# Patient Record
Sex: Female | Born: 1950
Health system: Southern US, Community
[De-identification: ages and names within clinical notes are randomized; demographics above are authoritative.]

## PROBLEM LIST (undated history)

## (undated) DIAGNOSIS — R42 Dizziness and giddiness: Secondary | ICD-10-CM

## (undated) DIAGNOSIS — J189 Pneumonia, unspecified organism: Secondary | ICD-10-CM

## (undated) DIAGNOSIS — E785 Hyperlipidemia, unspecified: Secondary | ICD-10-CM

## (undated) DIAGNOSIS — M1990 Unspecified osteoarthritis, unspecified site: Secondary | ICD-10-CM

## (undated) DIAGNOSIS — I10 Essential (primary) hypertension: Secondary | ICD-10-CM

## (undated) DIAGNOSIS — G709 Myoneural disorder, unspecified: Secondary | ICD-10-CM

## (undated) DIAGNOSIS — C801 Malignant (primary) neoplasm, unspecified: Secondary | ICD-10-CM

## (undated) DIAGNOSIS — T7840XA Allergy, unspecified, initial encounter: Secondary | ICD-10-CM

## (undated) DIAGNOSIS — G629 Polyneuropathy, unspecified: Secondary | ICD-10-CM

## (undated) DIAGNOSIS — K589 Irritable bowel syndrome without diarrhea: Secondary | ICD-10-CM

## (undated) DIAGNOSIS — K219 Gastro-esophageal reflux disease without esophagitis: Secondary | ICD-10-CM

## (undated) DIAGNOSIS — N189 Chronic kidney disease, unspecified: Secondary | ICD-10-CM

## (undated) HISTORY — PX: EYE SURGERY: SHX253

## (undated) HISTORY — DX: Myoneural disorder, unspecified: G70.9

## (undated) HISTORY — DX: Unspecified osteoarthritis, unspecified site: M19.90

## (undated) HISTORY — DX: Hyperlipidemia, unspecified: E78.5

## (undated) HISTORY — PX: VEIN SURGERY: SHX48

## (undated) HISTORY — DX: Essential (primary) hypertension: I10

## (undated) HISTORY — DX: Gastro-esophageal reflux disease without esophagitis: K21.9

## (undated) HISTORY — PX: APPENDECTOMY: SHX54

## (undated) HISTORY — DX: Irritable bowel syndrome without diarrhea: K58.9

## (undated) HISTORY — PX: COLONOSCOPY: SHX174

## (undated) HISTORY — DX: Allergy, unspecified, initial encounter: T78.40XA

---
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
## 1975-02-27 HISTORY — PX: TUBAL LIGATION: SHX77

## 2005-03-03 ENCOUNTER — Emergency Department: Payer: Self-pay | Admitting: Emergency Medicine

## 2005-03-08 ENCOUNTER — Emergency Department: Payer: Self-pay | Admitting: Emergency Medicine

## 2005-03-16 ENCOUNTER — Ambulatory Visit: Payer: Self-pay | Admitting: Family Medicine

## 2007-02-11 ENCOUNTER — Ambulatory Visit: Payer: Self-pay

## 2008-02-24 ENCOUNTER — Ambulatory Visit: Payer: Self-pay | Admitting: Family Medicine

## 2008-03-04 ENCOUNTER — Ambulatory Visit: Payer: Self-pay | Admitting: Family Medicine

## 2009-07-22 ENCOUNTER — Ambulatory Visit: Payer: Self-pay | Admitting: Family

## 2009-10-21 ENCOUNTER — Ambulatory Visit: Payer: Self-pay | Admitting: Family

## 2009-11-24 ENCOUNTER — Ambulatory Visit: Payer: Self-pay | Admitting: Family

## 2009-11-26 DIAGNOSIS — K589 Irritable bowel syndrome without diarrhea: Secondary | ICD-10-CM

## 2009-11-26 HISTORY — DX: Irritable bowel syndrome, unspecified: K58.9

## 2009-12-01 ENCOUNTER — Ambulatory Visit: Payer: Self-pay | Admitting: Gastroenterology

## 2010-12-14 ENCOUNTER — Encounter: Payer: Self-pay | Admitting: Internal Medicine

## 2010-12-14 ENCOUNTER — Ambulatory Visit (INDEPENDENT_AMBULATORY_CARE_PROVIDER_SITE_OTHER): Payer: PRIVATE HEALTH INSURANCE | Admitting: Internal Medicine

## 2010-12-14 DIAGNOSIS — M1990 Unspecified osteoarthritis, unspecified site: Secondary | ICD-10-CM | POA: Insufficient documentation

## 2010-12-14 DIAGNOSIS — Z124 Encounter for screening for malignant neoplasm of cervix: Secondary | ICD-10-CM

## 2010-12-14 DIAGNOSIS — J301 Allergic rhinitis due to pollen: Secondary | ICD-10-CM | POA: Insufficient documentation

## 2010-12-14 DIAGNOSIS — M129 Arthropathy, unspecified: Secondary | ICD-10-CM

## 2010-12-14 DIAGNOSIS — Z1239 Encounter for other screening for malignant neoplasm of breast: Secondary | ICD-10-CM | POA: Insufficient documentation

## 2010-12-14 DIAGNOSIS — E785 Hyperlipidemia, unspecified: Secondary | ICD-10-CM

## 2010-12-14 DIAGNOSIS — K219 Gastro-esophageal reflux disease without esophagitis: Secondary | ICD-10-CM | POA: Insufficient documentation

## 2010-12-14 DIAGNOSIS — I10 Essential (primary) hypertension: Secondary | ICD-10-CM | POA: Insufficient documentation

## 2010-12-14 MED ORDER — OMEPRAZOLE 20 MG PO CPDR: 20.0000 mg | DELAYED_RELEASE_CAPSULE | Freq: Every day | ORAL | Status: DC | PRN

## 2010-12-14 NOTE — Patient Instructions (Addendum)
For bloating or gas pains,  Try Gas X or Mylanta Gas  Try taking 2 ibupfrefen and 1 tylenol before bed for your joint pain .   Try the glucasomine supplement daily for 8 weeks for all joints.

## 2010-12-17 ENCOUNTER — Encounter: Payer: Self-pay | Admitting: Internal Medicine

## 2010-12-17 MED ORDER — GLUCOSAMINE-CHONDROITIN 500-400 MG PO TABS: 1.0000 | ORAL_TABLET | Freq: Four times a day (QID) | ORAL | Status: AC

## 2010-12-17 NOTE — Assessment & Plan Note (Signed)
She is tolerating simvastatin and recently had employee labs,  Which have been requested. Goal is LDL < 100.

## 2010-12-17 NOTE — Assessment & Plan Note (Signed)
She has mild pain and normal ROM in the right shoulder, aggravated by work.  Recommended judicious use of NSAIDs/acetominophen for now.

## 2010-12-17 NOTE — Progress Notes (Signed)
[id number] Subjective:    Patient ID: Jillian [name], female    DOB: Jul 05, 1950, 60 y.o.   MRN: [id number]  HPI  60 yo white female referred by daughter to transfer primary care from [facility] clinic for continuity issues.   Past Medical History  Diagnosis Date  . Arthritis     right shoulder,  no intervention  . Hypertension   . Irritable bowel syndrome (IBS) Oct 2011    last colonoscopy showed diverticulum, no polyps  . Hyperlipidemia   . Allergy     seasonal rhinitis  . GERD (gastroesophageal reflux disease)      No current outpatient prescriptions on file prior to visit.     Review of Systems  Constitutional: Negative for fever, chills and unexpected weight change.  HENT: Negative for hearing loss, ear pain, nosebleeds, congestion, sore throat, facial swelling, rhinorrhea, sneezing, mouth sores, trouble swallowing, neck pain, neck stiffness, voice change, postnasal drip, sinus pressure, tinnitus and ear discharge.   Eyes: Negative for pain, discharge, redness and visual disturbance.  Respiratory: Negative for cough, chest tightness, shortness of breath, wheezing and stridor.   Cardiovascular: Negative for chest pain, palpitations and leg swelling.  Musculoskeletal: Negative for myalgias and arthralgias.  Skin: Negative for color change and rash.  Neurological: Negative for dizziness, weakness, light-headedness and headaches.  Hematological: Negative for adenopathy.       Objective:   Physical Exam  Constitutional: She is oriented to person, place, and time. She appears well-developed and well-nourished.  HENT:  Mouth/Throat: Oropharynx is clear and moist.  Eyes: EOM are normal. Pupils are equal, round, and reactive to light. No scleral icterus.  Neck: Normal range of motion. Neck supple. No JVD present. No thyromegaly present.  Cardiovascular: Normal rate, regular rhythm, normal heart sounds and intact distal pulses.   Pulmonary/Chest: Effort normal and breath  sounds normal.  Abdominal: Soft. Bowel sounds are normal. She exhibits no mass. There is no tenderness.  Musculoskeletal: Normal range of motion. She exhibits no edema.  Lymphadenopathy:    She has no cervical adenopathy.  Neurological: She is alert and oriented to person, place, and time.  Skin: Skin is warm and dry.  Psychiatric: She has a normal mood and affect.          Assessment & Plan:

## 2011-02-06 ENCOUNTER — Ambulatory Visit: Payer: Self-pay | Admitting: Internal Medicine

## 2011-02-12 ENCOUNTER — Other Ambulatory Visit: Payer: Self-pay | Admitting: *Deleted

## 2011-02-12 MED ORDER — ENALAPRIL MALEATE 20 MG PO TABS: 20.0000 mg | ORAL_TABLET | Freq: Two times a day (BID) | ORAL | Status: DC

## 2011-02-17 LAB — HM MAMMOGRAPHY: HM Mammogram: NORMAL

## 2011-03-01 ENCOUNTER — Encounter: Payer: Self-pay | Admitting: Internal Medicine

## 2011-03-14 ENCOUNTER — Ambulatory Visit (INDEPENDENT_AMBULATORY_CARE_PROVIDER_SITE_OTHER): Payer: PRIVATE HEALTH INSURANCE | Admitting: Internal Medicine

## 2011-03-14 ENCOUNTER — Encounter: Payer: Self-pay | Admitting: Internal Medicine

## 2011-03-14 VITALS — BP 132/90 | HR 80 | Temp 98.7°F | Wt 191.0 lb

## 2011-03-14 DIAGNOSIS — M25519 Pain in unspecified shoulder: Secondary | ICD-10-CM

## 2011-03-14 DIAGNOSIS — E785 Hyperlipidemia, unspecified: Secondary | ICD-10-CM

## 2011-03-14 DIAGNOSIS — I10 Essential (primary) hypertension: Secondary | ICD-10-CM

## 2011-03-14 DIAGNOSIS — M25511 Pain in right shoulder: Secondary | ICD-10-CM

## 2011-03-14 MED ORDER — TRAMADOL HCL 50 MG PO TABS: 50.0000 mg | ORAL_TABLET | Freq: Three times a day (TID) | ORAL | Status: AC | PRN

## 2011-03-14 NOTE — Patient Instructions (Addendum)
For your shoulder,  You may take up to 4 gelcaps of advil at a time, not more than every 8 hours   Try adding tramadol (rx) 50 mg one tablet every 8 hours or just at bedtime to complement the advil for pain releif.  We will refer you to [facility],  Dr Ernest Pine,   For right shoulderr films and nonsurgical treatment   I recommend  Using the omeprazole daily to protect your stomach.

## 2011-03-14 NOTE — Progress Notes (Signed)
Subjective:    Patient ID: Jillian [name], female    DOB: 1950-06-04, 61 y.o.   MRN: [id number]  HPI  [name] is a 61 yo white female with a history of hypertension, hyperlipidemia and former tobacco abuse who presents for follow up on persistent  right shoulder pain which started end o September.  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  Her pain has increased and since November has had decreased ROM due to increased pain.  She cannot put her arm behind her back to tie her apron.  The pain  starts at top of her shoulder and radiates to her elbow.  She has noted tingling of the arm at bedtime which is disturbing her sleep. She has had no prior orthopedic eval and no prior x rays.  No history of trauma to the arm.  Has been using ibuprofen and glucosamine / chondroitin sulfate supplements daily with no significant difference.   Past Medical History  Diagnosis Date  . Arthritis     right shoulder,  no intervention  . Hypertension   . Irritable bowel syndrome (IBS) Oct 2011    last colonoscopy showed diverticulum, no polyps  . Hyperlipidemia   . Allergy     seasonal rhinitis  . GERD (gastroesophageal reflux disease)    Current Outpatient Prescriptions on File Prior to Visit  Medication Sig Dispense Refill  . amLODipine (NORVASC) 2.5 MG tablet Take 2.5 mg by mouth daily.        Marland Kitchen aspirin 81 MG tablet Take 81 mg by mouth daily.        . Calcium Carbonate-Vit D-Min (GNP CALCIUM 600 PLUS D/MINERAL) 600-400 MG-UNIT TABS Take 2 tablets by mouth daily.        . Cholecalciferol (VITAMIN D3) 1000 UNITS CAPS Take 1 capsule by mouth daily.        . enalapril (VASOTEC) 20 MG tablet Take 1 tablet (20 mg total) by mouth 2 (two) times daily.  60 tablet  5  . fexofenadine (ALLEGRA) 180 MG tablet Take 180 mg by mouth daily. Prn for seasonal allergies       . fluticasone (VERAMYST) 27.5 MCG/SPRAY nasal spray Place 2  sprays into the nose daily.        Marland Kitchen glucosamine-chondroitin (MAX GLUCOSAMINE CHONDROITIN) 500-400 MG tablet Take 1 tablet by mouth 4 (four) times daily.  120 tablet  5  . ibuprofen (ADVIL,MOTRIN) 200 MG tablet Take 400 mg by mouth every 6 (six) hours as needed.        . magnesium oxide (MAG-OX) 400 MG tablet Take 400 mg by mouth daily.        Marland Kitchen omeprazole (PRILOSEC) 20 MG capsule Take 1 capsule (20 mg total) by mouth daily as needed.  30 capsule  6  . simvastatin (ZOCOR) 40 MG tablet Take 40 mg by mouth at bedtime.            Review of Systems  Constitutional: Negative for fever, chills and unexpected weight change.  HENT: Negative for hearing loss, ear pain, nosebleeds, congestion, sore throat, facial swelling, rhinorrhea, sneezing, mouth sores, trouble swallowing, neck pain, neck stiffness, voice change, postnasal drip, sinus pressure, tinnitus and ear discharge.   Eyes: Negative for pain, discharge, redness and visual disturbance.  Respiratory: Negative for cough, chest tightness, shortness of breath, wheezing and stridor.   Cardiovascular: Negative for chest pain, palpitations and leg  swelling.  Musculoskeletal: Positive for arthralgias. Negative for myalgias.  Skin: Negative for color change and rash.  Neurological: Negative for dizziness, weakness, light-headedness and headaches.  Hematological: Negative for adenopathy.     BP 132/90  Pulse 80  Temp(Src) 98.7 F (37.1 C) (Oral)  Wt 191 lb (86.637 kg)  SpO2 97%     Objective:   Physical Exam  Constitutional: She is oriented to person, place, and time. She appears well-developed and well-nourished.  HENT:  Mouth/Throat: Oropharynx is clear and moist.  Eyes: EOM are normal. Pupils are equal, round, and reactive to light. No scleral icterus.  Neck: Normal range of motion. Neck supple. No JVD present. No thyromegaly present.  Cardiovascular: Normal rate, regular rhythm, normal heart sounds and intact distal pulses.     Pulmonary/Chest: Effort normal and breath sounds normal.  Abdominal: Soft. Bowel sounds are normal. She exhibits no mass. There is no tenderness.  Musculoskeletal: She exhibits no edema.       Right shoulder: She exhibits decreased range of motion and tenderness. She exhibits no swelling, no effusion and no crepitus.       Arms: Lymphadenopathy:    She has no cervical adenopathy.  Neurological: She is alert and oriented to person, place, and time.  Skin: Skin is warm and dry.  Psychiatric: She has a normal mood and affect.          Assessment & Plan:   Hypertension Elevated today, normal last visit on current regimen.  Have asked her to have her BP rechecked at [facility].  May befdue to use of NSAIDs.  She has normal renal function by labs done in Sept at [facility]. Cr was 0.92  Hyperlipidemia LDL was 89, trigs 180 and HDl 61 in September by [facility]  And LFTS were normal.  Will continue simvstatin   Shoulder pain, right ddx includes adhesive capsulitis vs impingement syndrome from bone spur vs partial rotator cuff tear.  Will refer to Dr. Ernest Pine for orthopedic evaluation.  Continue prn ibuprofen and add tramadol     Updated Medication List Outpatient Encounter Prescriptions as of 03/14/2011  Medication Sig Dispense Refill  . amLODipine (NORVASC) 2.5 MG tablet Take 2.5 mg by mouth daily.        Marland Kitchen aspirin 81 MG tablet Take 81 mg by mouth daily.        . Calcium Carbonate-Vit D-Min (GNP CALCIUM 600 PLUS D/MINERAL) 600-400 MG-UNIT TABS Take 2 tablets by mouth daily.        . Cholecalciferol (VITAMIN D3) 1000 UNITS CAPS Take 1 capsule by mouth daily.        . enalapril (VASOTEC) 20 MG tablet Take 1 tablet (20 mg total) by mouth 2 (two) times daily.  60 tablet  5  . fexofenadine (ALLEGRA) 180 MG tablet Take 180 mg by mouth daily. Prn for seasonal allergies       . fluticasone (VERAMYST) 27.5 MCG/SPRAY nasal spray Place 2 sprays into the nose daily.        Marland Kitchen  glucosamine-chondroitin (MAX GLUCOSAMINE CHONDROITIN) 500-400 MG tablet Take 1 tablet by mouth 4 (four) times daily.  120 tablet  5  . ibuprofen (ADVIL,MOTRIN) 200 MG tablet Take 400 mg by mouth every 6 (six) hours as needed.        . magnesium oxide (MAG-OX) 400 MG tablet Take 400 mg by mouth daily.        Marland Kitchen omeprazole (PRILOSEC) 20 MG capsule Take 1 capsule (20 mg total)  by mouth daily as needed.  30 capsule  6  . simvastatin (ZOCOR) 40 MG tablet Take 40 mg by mouth at bedtime.        . traMADol (ULTRAM) 50 MG tablet Take 1 tablet (50 mg total) by mouth every 8 (eight) hours as needed for pain.  90 tablet  1

## 2011-03-15 ENCOUNTER — Encounter: Payer: Self-pay | Admitting: Internal Medicine

## 2011-03-15 NOTE — Assessment & Plan Note (Signed)
LDL was 89, trigs 180 and HDl 61 in September by [facility]  And LFTS were normal.  Will continue simvstatin

## 2011-03-15 NOTE — Assessment & Plan Note (Signed)
ddx includes adhesive capsulitis vs impingement syndrome from bone spur vs partial rotator cuff tear.  Will refer to Dr. Ernest Pine for orthopedic evaluation.  Continue prn ibuprofen and add tramadol

## 2011-03-15 NOTE — Assessment & Plan Note (Signed)
Elevated today, normal last visit on current regimen.  Have asked her to have her BP rechecked at [facility].  May befdue to use of NSAIDs.  She has normal renal function by labs done in Sept at [facility]. Cr was 0.92

## 2011-03-20 ENCOUNTER — Encounter: Payer: Self-pay | Admitting: Physician Assistant

## 2011-03-30 ENCOUNTER — Encounter: Payer: Self-pay | Admitting: Physician Assistant

## 2011-04-27 ENCOUNTER — Encounter: Payer: Self-pay | Admitting: Physician Assistant

## 2011-06-12 ENCOUNTER — Ambulatory Visit: Payer: PRIVATE HEALTH INSURANCE | Admitting: Internal Medicine

## 2011-06-18 ENCOUNTER — Ambulatory Visit (INDEPENDENT_AMBULATORY_CARE_PROVIDER_SITE_OTHER): Payer: PRIVATE HEALTH INSURANCE | Admitting: Internal Medicine

## 2011-06-18 ENCOUNTER — Encounter: Payer: Self-pay | Admitting: Internal Medicine

## 2011-06-18 VITALS — BP 136/80 | HR 62 | Temp 98.3°F | Resp 14 | Wt 194.8 lb

## 2011-06-18 DIAGNOSIS — M25519 Pain in unspecified shoulder: Secondary | ICD-10-CM

## 2011-06-18 DIAGNOSIS — I10 Essential (primary) hypertension: Secondary | ICD-10-CM

## 2011-06-18 DIAGNOSIS — E669 Obesity, unspecified: Secondary | ICD-10-CM

## 2011-06-18 DIAGNOSIS — E785 Hyperlipidemia, unspecified: Secondary | ICD-10-CM

## 2011-06-18 DIAGNOSIS — M25511 Pain in right shoulder: Secondary | ICD-10-CM

## 2011-06-18 LAB — COMPLETE METABOLIC PANEL WITH GFR
Albumin: 4.4 g/dL (ref 3.5–5.2)
Alkaline Phosphatase: 59 U/L (ref 39–117)
BUN: 17 mg/dL (ref 6–23)
Creat: 1.01 mg/dL (ref 0.50–1.10)
GFR, Est Non African American: 60 mL/min
Glucose, Bld: 86 mg/dL (ref 70–99)
Potassium: 4.1 mEq/L (ref 3.5–5.3)

## 2011-06-18 LAB — LIPID PANEL
HDL: 55.8 mg/dL (ref >39.00)
Total CHOL/HDL Ratio: 3

## 2011-06-18 MED ORDER — ENALAPRIL MALEATE 20 MG PO TABS: 20.0000 mg | ORAL_TABLET | Freq: Two times a day (BID) | ORAL | Status: DC

## 2011-06-18 NOTE — Progress Notes (Signed)
Patient ID: Jillian [name], female   DOB: 11-06-1950, 61 y.o.   MRN: [id number]   Patient Active Problem List  Diagnoses  . Arthritis  . Hyperlipidemia  . Hypertension  . Allergy  . Screening for breast cancer  . Screening for cervical cancer  . GERD (gastroesophageal reflux disease)  . Shoulder pain, right  . Obesity (BMI 30-39.9)    Subjective:  CC:   Chief Complaint  Patient presents with  . Follow-up    HPI:   Jillian [name] a 61 y.o. female who presents  for follow up on chronic issues including hypertension, hyperlipidemia gastroesophageal reflux disease , and right shoulder pain aggravated by work duties.  She  received steroid injection in the superiori scapular region by Dr. Elenor Legato PA with good results, including immediate relief of pain.  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    Past Medical History  Diagnosis Date  . Arthritis     right shoulder,  no intervention  . Hypertension   . Irritable bowel syndrome (IBS) Oct 2011    last colonoscopy showed diverticulum, no polyps  . Hyperlipidemia   . Allergy     seasonal rhinitis  . GERD (gastroesophageal reflux disease)     Past Surgical History  Procedure Date  . Tubal ligation 1977  . Tonsillectomy and adenoidectomy 1956         The following portions of the patient's history were reviewed and updated as appropriate: Allergies, current medications, and problem list.    Review of Systems:   12 Pt  review of systems was negative except those addressed in the HPI,     History   Social History  . Marital Status: Divorced    Spouse Name: N/A    Number of Children: N/A  . Years of Education: N/A   Occupational History  . cutsodial     works at [employer]   Social History Main Topics  . Smoking  status: Former Smoker    Types: Cigarettes    Quit date: 03/16/1999  . Smokeless tobacco: Never Used  . Alcohol Use: No  . Drug Use: No  . Sexually Active: Not Currently   Other Topics Concern  . Not on file   Social History Narrative  . No narrative on file    Objective:  BP 136/80  Pulse 62  Temp(Src) 98.3 F (36.8 C) (Oral)  Resp 14  Wt 194 lb 12 oz (88.338 kg)  SpO2 94%  General appearance: alert, cooperative and appears stated age Ears: normal TM's and external ear canals both ears Throat: lips, mucosa, and tongue normal; teeth and gums normal Neck: no adenopathy, no carotid bruit, supple, symmetrical, trachea midline and thyroid not enlarged, symmetric, no tenderness/mass/nodules Back: symmetric, no curvature. ROM normal. No CVA tenderness. Lungs: clear to auscultation bilaterally Heart: regular rate and rhythm, S1, S2 normal, no murmur, click, rub or gallop Abdomen: soft, non-tender; bowel sounds normal; no masses,  no organomegaly Pulses: 2+ and symmetric Skin: Skin color, texture, turgor normal. No rashes or lesions Lymph nodes: Cervical, supraclavicular, and axillary nodes normal.  Assessment and Plan:  Hyperlipidemia Managed with zocor.  LDL is below goal at 76.  Hypertension Improved today. Renal function is normal. Continue current medications  Shoulder pain, right Secondary to  bone spurs due to overuse phenomenon. She has significant relief with a steroid injection into the shoulder. Currently her pain is not completely resolved but is tolerable. 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  Obesity (BMI 30-39.9) I have addressed  BMI and recommended a low glycemic index diet utilizing smaller more frequent meals to increase metabolism.  I have also recommended that patient start exercising with a goal of 30 minutes of aerobic exercise a minimum of 5 days per week.      Updated  Medication List Outpatient Encounter Prescriptions as of 06/18/2011  Medication Sig Dispense Refill  . amLODipine (NORVASC) 2.5 MG tablet Take 2.5 mg by mouth daily.        Marland Kitchen aspirin 81 MG tablet Take 81 mg by mouth daily.        . Cholecalciferol (VITAMIN D3) 1000 UNITS CAPS Take 1 capsule by mouth daily.        . enalapril (VASOTEC) 20 MG tablet Take 1 tablet (20 mg total) by mouth 2 (two) times daily.  180 tablet  3  . fexofenadine (ALLEGRA) 180 MG tablet Take 180 mg by mouth daily. Prn for seasonal allergies       . fluticasone (VERAMYST) 27.5 MCG/SPRAY nasal spray Place 2 sprays into the nose daily.        Marland Kitchen glucosamine-chondroitin (MAX GLUCOSAMINE CHONDROITIN) 500-400 MG tablet Take 1 tablet by mouth 4 (four) times daily.  120 tablet  5  . ibuprofen (ADVIL,MOTRIN) 200 MG tablet Take 400 mg by mouth every 6 (six) hours as needed.        . magnesium oxide (MAG-OX) 400 MG tablet Take 400 mg by mouth daily.        Marland Kitchen omeprazole (PRILOSEC) 20 MG capsule Take 1 capsule (20 mg total) by mouth daily as needed.  30 capsule  6  . simvastatin (ZOCOR) 40 MG tablet Take 40 mg by mouth at bedtime.        Marland Kitchen DISCONTD: enalapril (VASOTEC) 20 MG tablet Take 1 tablet (20 mg total) by mouth 2 (two) times daily.  60 tablet  5  . DISCONTD: Calcium Carbonate-Vit D-Min (GNP CALCIUM 600 PLUS D/MINERAL) 600-400 MG-UNIT TABS Take 2 tablets by mouth daily.           Orders Placed This Encounter  Procedures  . MyChart Weight Flowsheet  . TSH  . Lipid panel  . COMPLETE METABOLIC PANEL WITH GFR    No Follow-up on file.

## 2011-06-18 NOTE — Assessment & Plan Note (Addendum)
Improved today. Renal function is normal. Continue current medications

## 2011-06-18 NOTE — Assessment & Plan Note (Addendum)
Managed with zocor.  LDL is below goal at 76.

## 2011-06-18 NOTE — Assessment & Plan Note (Signed)
I have addressed  BMI and recommended a low glycemic index diet utilizing smaller more frequent meals to increase metabolism.  I have also recommended that patient start exercising with a goal of 30 minutes of aerobic exercise a minimum of 5 days per week.  

## 2011-06-18 NOTE — Patient Instructions (Signed)
Consider the Low Glycemic Index Diet and 6 smaller meals daily :   7 AM  Scrambled eggs, meat on a low carb bread or tortilla   (Add or substitute a toasted sandwhich thin w/ peanut butter)  10 AM: Protein bar by Atkins bar:  Caramel chocolate Nut Roll  Lunch: sandwich on pita bread or flatbread (Joseph's makes a pita bread and a flat bread , available at [employer] and [employer]; Toufayah makes a low carb flatbread available at [employer] and [employer])   3 PM:  Mid day :  Another protein bar,  Or a  cheese stick, 1/4 cup of almonds, walnuts, pistachios, pecans, peanuts,  Macadamia nuts  6 PM  Dinner:  "mean and green:"  Meat/chicken/fish, salad, and green veggie : use ranch, vinagrette,  Blue cheese, etc  9 PM snack : Breyer's low carb fudgiscle or  ice cream bar (Carb Smart) Weight Watcher's ice cream bar , or another protein shake

## 2011-06-18 NOTE — Assessment & Plan Note (Signed)
Secondary to bone spurs due to overuse phenomenon. She has significant relief with a steroid injection into the shoulder. Currently her pain is not completely resolved but is tolerable. 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

## 2011-08-28 ENCOUNTER — Other Ambulatory Visit: Payer: Self-pay | Admitting: *Deleted

## 2011-08-28 MED ORDER — AMLODIPINE BESYLATE 2.5 MG PO TABS: 2.5000 mg | ORAL_TABLET | Freq: Every day | ORAL | Status: DC

## 2011-09-09 ENCOUNTER — Ambulatory Visit: Payer: Self-pay | Admitting: Family Medicine

## 2011-09-09 LAB — URINALYSIS, COMPLETE
Bilirubin,UR: NEGATIVE
Glucose,UR: NEGATIVE mg/dL (ref 0–75)
Ketone: NEGATIVE
Specific Gravity: 1.015 (ref 1.003–1.030)

## 2011-09-17 ENCOUNTER — Ambulatory Visit (INDEPENDENT_AMBULATORY_CARE_PROVIDER_SITE_OTHER): Payer: PRIVATE HEALTH INSURANCE | Admitting: Internal Medicine

## 2011-09-17 ENCOUNTER — Encounter: Payer: Self-pay | Admitting: Internal Medicine

## 2011-09-17 VITALS — BP 102/58 | HR 57 | Temp 98.3°F | Resp 16 | Wt 186.0 lb

## 2011-09-17 DIAGNOSIS — M25519 Pain in unspecified shoulder: Secondary | ICD-10-CM

## 2011-09-17 DIAGNOSIS — M25511 Pain in right shoulder: Secondary | ICD-10-CM

## 2011-09-17 DIAGNOSIS — E669 Obesity, unspecified: Secondary | ICD-10-CM

## 2011-09-17 DIAGNOSIS — E785 Hyperlipidemia, unspecified: Secondary | ICD-10-CM

## 2011-09-17 MED ORDER — AMLODIPINE BESYLATE 2.5 MG PO TABS: 2.5000 mg | ORAL_TABLET | Freq: Every day | ORAL | Status: DC

## 2011-09-17 NOTE — Assessment & Plan Note (Signed)
Improved but not resolved,  Aggravated by work duties .  Continue NSAID

## 2011-09-17 NOTE — Progress Notes (Signed)
Patient ID: Jillian [name], female   DOB: 06-18-1950, 61 y.o.   MRN: [id number]  Patient Active Problem List  Diagnosis  . Arthritis  . Hyperlipidemia  . Hypertension  . Allergy  . Screening for breast cancer  . Screening for cervical cancer  . GERD (gastroesophageal reflux disease)  . Shoulder pain, right  . Obesity (BMI 30-39.9)    Subjective:  CC:   Chief Complaint  Patient presents with  . Follow-up    3 month    HPI:   Jillian Hemphillis a 61 y.o. female who presents for followup on chronic issues of hyperlipidemia obesity and hypertension. She had her recent episode of suprapubic pain. She finished Finished  cipro x  7 days for UTI treated by [facility],  But was told the culture did not grow anything.  Her symptoms were flank pain and hematuria which have resolved.  No dysuria but feels irritated   Wondered if she had a stone, no prior history of stones. Has lost 8 lb on the low glycemic index diet since last visit.     Past Medical History  Diagnosis Date  . Arthritis     right shoulder,  no intervention  . Hypertension   . Irritable bowel syndrome (IBS) Oct 2011    last colonoscopy showed diverticulum, no polyps  . Hyperlipidemia   . Allergy     seasonal rhinitis  . GERD (gastroesophageal reflux disease)     Past Surgical History  Procedure Date  . Tubal ligation 1977  . Tonsillectomy and adenoidectomy 1956         The following portions of the patient's history were reviewed and updated as appropriate: Allergies, current medications, and problem list.    Review of Systems:   Review of Systems  Constitutional: Positive for weight loss. Negative for fever and chills.  HENT: Negative.   Eyes: Negative.   Respiratory: Negative for cough and shortness of breath.   Cardiovascular: Negative for chest pain and PND.  Gastrointestinal: Negative for heartburn and abdominal pain.  Genitourinary: Negative for dysuria, frequency and flank pain.    Musculoskeletal: Negative.   Skin: Negative.   Neurological: Negative.   Endo/Heme/Allergies: Negative.   Psychiatric/Behavioral: Negative.        History   Social History  . Marital Status: Divorced    Spouse Name: N/A    Number of Children: N/A  . Years of Education: N/A   Occupational History  . cutsodial     works at [employer]   Social History Main Topics  . Smoking status: Former Smoker    Types: Cigarettes    Quit date: 03/16/1999  . Smokeless tobacco: Never Used  . Alcohol Use: No  . Drug Use: No  . Sexually Active: Not Currently   Other Topics Concern  . Not on file   Social History Narrative  . No narrative on file    Objective:  BP 102/58  Pulse 57  Temp 98.3 F (36.8 C) (Oral)  Resp 16  Wt 186 lb (84.369 kg)  SpO2 95%  General appearance: alert, cooperative and appears stated age Ears: normal TM's and external ear canals both ears Throat: lips, mucosa, and tongue normal; teeth and gums normal Neck: no adenopathy, no carotid bruit, supple, symmetrical, trachea midline and thyroid not enlarged, symmetric, no tenderness/mass/nodules Back: symmetric, no curvature. ROM normal. No CVA tenderness. Lungs: clear to auscultation bilaterally Heart: regular rate and rhythm, S1, S2 normal, no murmur, click, rub or gallop Abdomen:  soft, non-tender; bowel sounds normal; no masses,  no organomegaly Pulses: 2+ and symmetric Skin: Skin color, texture, turgor normal. No rashes or lesions Lymph nodes: Cervical, supraclavicular, and axillary nodes normal.  Assessment and Plan:  Shoulder pain, right Improved but not resolved,  Aggravated by work duties .  Continue NSAID  Obesity (BMI 30-39.9) Improving , with 8 lb wt loss thus far. Her goal is to lose another 40 pounds. Encouraged her that 4 pound weight loss per month is a healthy sustainable rate. Recommended continued to increase her activity level to a goal of 30 minutes of aerobic activity 5 days per week.  Continue low glycemic index diet.  Hyperlipidemia Well controlled with LDL less than 100 by April fasting lipids. Continue Zocor for now. Liver function tests are due.   Updated Medication List Outpatient Encounter Prescriptions as of 09/17/2011  Medication Sig Dispense Refill  . amLODipine (NORVASC) 2.5 MG tablet Take 1 tablet (2.5 mg total) by mouth daily.  30 tablet  5  . aspirin 81 MG tablet Take 81 mg by mouth daily.        . Cholecalciferol (VITAMIN D3) 1000 UNITS CAPS Take 1 capsule by mouth daily.        . enalapril (VASOTEC) 20 MG tablet Take 1 tablet (20 mg total) by mouth 2 (two) times daily.  180 tablet  3  . fexofenadine (ALLEGRA) 180 MG tablet Take 180 mg by mouth daily. Prn for seasonal allergies       . fluticasone (VERAMYST) 27.5 MCG/SPRAY nasal spray Place 2 sprays into the nose daily.        Marland Kitchen ibuprofen (ADVIL,MOTRIN) 200 MG tablet Take 400 mg by mouth every 6 (six) hours as needed.        . magnesium oxide (MAG-OX) 400 MG tablet Take 400 mg by mouth daily.        Marland Kitchen omeprazole (PRILOSEC) 20 MG capsule Take 1 capsule (20 mg total) by mouth daily as needed.  30 capsule  6  . simvastatin (ZOCOR) 40 MG tablet Take 40 mg by mouth at bedtime.        Marland Kitchen DISCONTD: amLODipine (NORVASC) 2.5 MG tablet Take 1 tablet (2.5 mg total) by mouth daily.  30 tablet  4  . glucosamine-chondroitin (MAX GLUCOSAMINE CHONDROITIN) 500-400 MG tablet Take 1 tablet by mouth 4 (four) times daily.  120 tablet  5     Orders Placed This Encounter  Procedures  . HM MAMMOGRAPHY  . HM PAP SMEAR  . HM COLONOSCOPY    No Follow-up on file.

## 2011-09-17 NOTE — Patient Instructions (Addendum)
Have your nonfasting liver and kidney function done in 3 months  (octoberfest)  Return in 6 months for annual exam

## 2011-09-17 NOTE — Assessment & Plan Note (Signed)
Well controlled with LDL less than 100 by April fasting lipids. Continue Zocor for now. Liver function tests are due.

## 2011-09-17 NOTE — Assessment & Plan Note (Signed)
Improving , with 8 lb wt loss thus far. Her goal is to lose another 40 pounds. Encouraged her that 4 pound weight loss per month is a healthy sustainable rate. Recommended continued to increase her activity level to a goal of 30 minutes of aerobic activity 5 days per week. Continue low glycemic index diet.

## 2011-10-23 ENCOUNTER — Encounter: Payer: Self-pay | Admitting: Internal Medicine

## 2011-10-23 ENCOUNTER — Ambulatory Visit (INDEPENDENT_AMBULATORY_CARE_PROVIDER_SITE_OTHER): Payer: PRIVATE HEALTH INSURANCE | Admitting: Internal Medicine

## 2011-10-23 VITALS — BP 114/68 | HR 62 | Temp 98.5°F | Resp 14 | Wt 182.5 lb

## 2011-10-23 DIAGNOSIS — R600 Localized edema: Secondary | ICD-10-CM

## 2011-10-23 DIAGNOSIS — I872 Venous insufficiency (chronic) (peripheral): Secondary | ICD-10-CM

## 2011-10-23 DIAGNOSIS — R609 Edema, unspecified: Secondary | ICD-10-CM

## 2011-10-23 NOTE — Progress Notes (Signed)
Patient ID: Jillian [name], female   DOB: 1950-10-23, 61 y.o.   MRN: [id number]  Patient Active Problem List  Diagnosis  . Arthritis  . Hyperlipidemia  . Hypertension  . Allergy  . Screening for breast cancer  . Screening for cervical cancer  . GERD (gastroesophageal reflux disease)  . Shoulder pain, right  . Obesity (BMI 30-39.9)  . Venous insufficiency of leg    Subjective:  CC:   Chief Complaint  Patient presents with  . Leg Problem    HPI:   Jillian [name] a 61 y.o. female who presents Recurrent red rash on legs occurring on lower extremities .  Aggravated by standing .  legs swollen and warm by the end of the day.   Gone by next morning .   No history of fevers,  insect bites or abrasions to legs.  No history of surgery to either leg but she did have 2 pregnancies and a history of obesity , with 12 lb wt loss since last October and imporvement in BMI to 29 using a Low GI diet. . No prior venous evaluation.  started taking aspirin daily and wearing 2 paris of ankle socks. 2) plantar fasciitis. Managed by  Alberteen Spindle .  She rolls her feet every morning over a frozen bottle of soda to help stretch her fascia and manage the inflammation.   Past Medical History  Diagnosis Date  . Arthritis     right shoulder,  no intervention  . Hypertension   . Irritable bowel syndrome (IBS) Oct 2011    last colonoscopy showed diverticulum, no polyps  . Hyperlipidemia   . Allergy     seasonal rhinitis  . GERD (gastroesophageal reflux disease)     Past Surgical History  Procedure Date  . Tubal ligation 1977  . Tonsillectomy and adenoidectomy 1956         The following portions of the patient's history were reviewed and updated as appropriate: Allergies, current medications, and problem list.    Review of Systems:   12 Pt  review of systems was negative except those addressed in the HPI,     History   Social History  . Marital Status: Divorced    Spouse Name: N/A      Number of Children: N/A  . Years of Education: N/A   Occupational History  . cutsodial     works at [employer]   Social History Main Topics  . Smoking status: Former Smoker    Types: Cigarettes    Quit date: 03/16/1999  . Smokeless tobacco: Never Used  . Alcohol Use: No  . Drug Use: No  . Sexually Active: Not Currently   Other Topics Concern  . Not on file   Social History Narrative  . No narrative on file    Objective:  BP 114/68  Pulse 62  Temp 98.5 F (36.9 C) (Oral)  Resp 14  Wt 182 lb 8 oz (82.781 kg)  SpO2 94%  General appearance: alert, cooperative and appears stated age Ears: normal TM's and external ear canals both ears Throat: lips, mucosa, and tongue normal; teeth and gums normal Neck: no adenopathy, no carotid bruit, supple, symmetrical, trachea midline and thyroid not enlarged, symmetric, no tenderness/mass/nodules Back: symmetric, no curvature. ROM normal. No CVA tenderness. Lungs: clear to auscultation bilaterally Heart: regular rate and rhythm, S1, S2 normal, no murmur, click, rub or gallop Abdomen: soft, non-tender; bowel sounds normal; no masses,  no organomegaly Pulses: 2+ and symmetric Skin: Skin color, texture,  turgor normal. No rashes or lesions Lymph nodes: Cervical, supraclavicular, and axillary nodes normal.  Assessment and Plan:  Venous insufficiency of leg Suspected, by history of recurrent episodes of bilateral edema ,  Redness and warmth aggravated by prolonged standing and relieved with leg elevation.  Risk factors include 2 pregnancies and occupational activities with standing for 8 hours daily. Referral to [facility] for ultrasound.    Updated Medication List Outpatient Encounter Prescriptions as of 10/23/2011  Medication Sig Dispense Refill  . amLODipine (NORVASC) 2.5 MG tablet Take 1 tablet (2.5 mg total) by mouth daily.  30 tablet  5  . aspirin 81 MG tablet Take 81 mg by mouth daily.        . Cholecalciferol (VITAMIN D3) 1000 UNITS  CAPS Take 1 capsule by mouth daily.        . enalapril (VASOTEC) 20 MG tablet Take 1 tablet (20 mg total) by mouth 2 (two) times daily.  180 tablet  3  . fexofenadine (ALLEGRA) 180 MG tablet Take 180 mg by mouth daily. Prn for seasonal allergies       . fluticasone (VERAMYST) 27.5 MCG/SPRAY nasal spray Place 2 sprays into the nose daily.        Marland Kitchen glucosamine-chondroitin (MAX GLUCOSAMINE CHONDROITIN) 500-400 MG tablet Take 1 tablet by mouth 4 (four) times daily.  120 tablet  5  . ibuprofen (ADVIL,MOTRIN) 200 MG tablet Take 400 mg by mouth every 6 (six) hours as needed.        . magnesium oxide (MAG-OX) 400 MG tablet Take 400 mg by mouth daily.        Marland Kitchen omeprazole (PRILOSEC) 20 MG capsule Take 1 capsule (20 mg total) by mouth daily as needed.  30 capsule  6  . simvastatin (ZOCOR) 40 MG tablet Take 40 mg by mouth at bedtime.           Orders Placed This Encounter  Procedures  . Ambulatory referral to Vascular Surgery    No Follow-up on file.

## 2011-10-23 NOTE — Assessment & Plan Note (Addendum)
Suspected, by history of recurrent episodes of bilateral edema ,  Redness and warmth aggravated by prolonged standing and relieved with leg elevation.  Risk factors include 2 pregnancies and occupational activities with standing for 8 hours daily. Referral to [facility] for ultrasound.

## 2011-10-24 ENCOUNTER — Encounter: Payer: Self-pay | Admitting: Internal Medicine

## 2011-11-20 ENCOUNTER — Other Ambulatory Visit: Payer: Self-pay | Admitting: *Deleted

## 2011-11-20 MED ORDER — SIMVASTATIN 40 MG PO TABS: 40.0000 mg | ORAL_TABLET | Freq: Every day | ORAL | Status: DC

## 2012-03-24 ENCOUNTER — Other Ambulatory Visit (HOSPITAL_COMMUNITY)
Admission: RE | Admit: 2012-03-24 | Discharge: 2012-03-24 | Disposition: A | Discharge status: Home / Self Care | Payer: 59 | Source: Ambulatory Visit | Attending: Internal Medicine | Admitting: Internal Medicine

## 2012-03-24 ENCOUNTER — Encounter: Payer: Self-pay | Admitting: Internal Medicine

## 2012-03-24 ENCOUNTER — Ambulatory Visit (INDEPENDENT_AMBULATORY_CARE_PROVIDER_SITE_OTHER): Payer: 59 | Admitting: Internal Medicine

## 2012-03-24 VITALS — BP 118/78 | HR 73 | Temp 98.1°F | Resp 16 | Ht 67.0 in | Wt 184.5 lb

## 2012-03-24 DIAGNOSIS — Z01419 Encounter for gynecological examination (general) (routine) without abnormal findings: Secondary | ICD-10-CM | POA: Insufficient documentation

## 2012-03-24 DIAGNOSIS — Z139 Encounter for screening, unspecified: Secondary | ICD-10-CM

## 2012-03-24 DIAGNOSIS — Z0000 Encounter for general adult medical examination without abnormal findings: Secondary | ICD-10-CM | POA: Insufficient documentation

## 2012-03-24 DIAGNOSIS — Z1151 Encounter for screening for human papillomavirus (HPV): Secondary | ICD-10-CM | POA: Insufficient documentation

## 2012-03-24 DIAGNOSIS — Z1239 Encounter for other screening for malignant neoplasm of breast: Secondary | ICD-10-CM

## 2012-03-24 MED ORDER — FLUTICASONE FUROATE 27.5 MCG/SPRAY NA SUSP: 2.0000 | Freq: Every day | NASAL | Status: DC

## 2012-03-24 MED ORDER — FEXOFENADINE HCL 180 MG PO TABS: 180.0000 mg | ORAL_TABLET | Freq: Every day | ORAL | Status: DC

## 2012-03-24 NOTE — Progress Notes (Signed)
Patient ID: Jillian [name], female   DOB: 04/24/1950, 62 y.o.   MRN: [id number]   Subjective:     Jillian [name] is a 62 y.o. female and is here for a comprehensive physical exam.  PAP and mammogram due. No new issues  Lipids 168 tris 148  hdl 69   ldl 69.  BUN / cr  19/0.98  TSH 1.27  [facility] Employee panel . Gained 2 lbs but still down 10 lbs from April on low carb diet.     History   Social History  . Marital Status: Divorced    Spouse Name: N/A    Number of Children: N/A  . Years of Education: N/A   Occupational History  . cutsodial     works at [employer]   Social History Main Topics  . Smoking status: Former Smoker    Types: Cigarettes    Quit date: 03/16/1999  . Smokeless tobacco: Never Used  . Alcohol Use: No  . Drug Use: No  . Sexually Active: Not Currently   Other Topics Concern  . Not on file   Social History Narrative  . No narrative on file   Health Maintenance  Topic Date Due  . Tetanus/tdap  07/31/1968  . Zostavax  07/31/2009  . Influenza Vaccine  10/28/2011  . Pap Smear  09/16/2012  . Mammogram  02/16/2013  . Colonoscopy  12/18/2018    The following portions of the patient's history were reviewed and updated as appropriate: allergies, current medications, past family history, past medical history, past social history, past surgical history and problem list.  Review of Systems A comprehensive review of systems was negative.   Objective:        Assessment:    Healthy female exam. BP 118/78  Pulse 73  Temp 98.1 F (36.7 C) (Oral)  Resp 16  Ht 5\' 7"  (1.702 m)  Wt 184 lb 8 oz (83.689 kg)  BMI 28.90 kg/m2  SpO2 98%  General Appearance:    Alert, cooperative, no distress, appears stated age  Head:    Normocephalic, without obvious abnormality, atraumatic  Eyes:    PERRL, conjunctiva/corneas clear, EOM's intact, fundi    benign, both eyes  Ears:    Normal TM's and external ear canals, both ears  Nose:   Nares normal, septum midline, mucosa normal,  no drainage    or sinus tenderness  Throat:   Lips, mucosa, and tongue normal; teeth and gums normal  Neck:   Supple, symmetrical, trachea midline, no adenopathy;    thyroid:  no enlargement/tenderness/nodules; no carotid   bruit or JVD  Back:     Symmetric, no curvature, ROM normal, no CVA tenderness  Lungs:     Clear to auscultation bilaterally, respirations unlabored  Chest Wall:    No tenderness or deformity   Heart:    Regular rate and rhythm, S1 and S2 normal, no murmur, rub   or gallop  Breast Exam:    No tenderness, masses, or nipple abnormality  Abdomen:     Soft, non-tender, bowel sounds active all four quadrants,    no masses, no organomegaly  Genitalia:    Normal female without lesion, discharge or tenderness     Extremities:   Extremities normal, atraumatic, no cyanosis or edema  Pulses:   2+ and symmetric all extremities  Skin:   Skin color, texture, turgor normal, no rashes or lesions  Lymph nodes:   Cervical, supraclavicular, and axillary nodes normal  Neurologic:   CNII-XII intact,  normal strength, sensation and reflexes    throughout       Plan:

## 2012-03-24 NOTE — Assessment & Plan Note (Signed)
Breast, pelvic and PAP smear were done today.

## 2012-03-24 NOTE — Patient Instructions (Addendum)
You mammogram has been ordered.  A PAP smear was done.   You can lo lose 10%  Of your current body weight over the next 6 months   This is  my  Updated version of a  "Low GI"  Diet:  It is not ultra low carb, but will still lower your blood sugars and allow you to lose 5 to 10 lbs per month if you follow it carefully. All of the foods can be found at grocery stores and in bulk at [employer].  The Atkins protein bars and shakes are available in more varieties at [employer], [employer] and [employer].     7 AM Breakfast:  Low carbohydrate Protein  Shakes (I recommend the EAS AdvantEdge "Carb Control" shakes  Or the low carb shakes by Atkins.   Both are available everywhere:  In  cases at [employer]Js  Or in 4 packs at grocery stores and pharmacies  2.5 carbs  (Alternative is  a toasted Arnold's Sandwhich Thin w/ peanut butter, a "Bagel Thin" with cream cheese and salmon) or  a scrambled egg burrito made with a low carb tortilla .  Avoid cereal and bananas, oatmeal too unless you are cooking the old fashioned kind that takes 30-40 minutes to prepare.  the rest is overly processed, has minimal fiber, and is loaded with carbohydrates!   10 AM: Protein bar by Atkins (the snack size, under 200 cal).  There are many varieties , available widely again or in bulk in limited varieties at BJs)  Other so called "protein bars" tend to be loaded with carbohydrates.  Remember, in food advertising, the word "energy" is synonymous for " carbohydrate."  Lunch: sandwich of [place], (or any lunchmeat, grilled meat or canned tuna), fresh avocado, mayonnaise  and cheese on a lower carbohydrate pita bread, flatbread, or tortilla . Ok to use regular mayonnaise. The bread is the only source or carbohydrate that can be decreased (Joseph's makes a pita bread and a flat bread that are 50 cal and 4 net carbs ; Toufayan makes a low carb flatbread that's 100 cal and 9 net carbs  and  Mission makes a low carb whole wheat tortilla  That is 210 cal and 6  net carbs)  3 PM:  Mid day :  Another protein bar,  Or a  cheese stick (100 cal, 0 carbs),  Or 1 ounce of  almonds, walnuts, pistachios, pecans, peanuts,  Macadamia nuts. Or a Dannon light n Fit greek yogurt, 80 cal 8 net carbs . Avoid "granola"; the dried cranberries and raisins are loaded with carbohydrates. Mixed nuts ok if no raisins or cranberries or dried fruit.      6 PM  Dinner:  "mean and green:"  Meat/chicken/fish or a high protein legume; , with a green salad, and a low GI  Veggie (broccoli, cauliflower, green beans, spinach, brussel sprouts. Lima beans) : Avoid "Low fat dressings, as well as Reyne Dumas and [address]! They are loaded with sugar! Instead use ranch, vinagrette,  Blue cheese, etc.  There is a low carb pasta by Dreamfield's available at [facility] that is acceptable and tastes great. Try Michel Angel's chicken piccata over low carb pasta. The chicken dish is 0 carbs, and can be found in frozen section at BJs and [employer]. Also try [employer] "Carnitas" (pulled pork, no sauce,  0 carbs) and his pot roast.   both are in the refrigerated section at BJs   Dreamfield's makes a low carb  pasta only 5 g/serving.  Available at all grocery stores,  And tastes like normal pasta  9 PM snack : Breyer's "low carb" fudgsicle or  ice cream bar (Carb Smart line), or  Weight Watcher's ice cream bar , or another "no sugar added" ice cream;a serving of fresh berries/cherries with whipped cream (Avoid bananas, pineapple, grapes  and watermelon on a regular basis because they are high in sugar)   Remember that snack Substitutions should be less than 10 carbs per serving and meals < 20 carbs. Remember to subtract fiber grams and sugar alcohols to get the "net carbs."

## 2012-03-27 ENCOUNTER — Telehealth: Payer: Self-pay | Admitting: Internal Medicine

## 2012-03-27 LAB — HM PAP SMEAR: HM Pap smear: NORMAL

## 2012-03-27 NOTE — Telephone Encounter (Signed)
Pt calling stating she is returning a call about results.  Please call at home.

## 2012-03-28 NOTE — Telephone Encounter (Signed)
Pt.notified

## 2012-04-01 ENCOUNTER — Encounter: Payer: Self-pay | Admitting: Internal Medicine

## 2012-04-01 DIAGNOSIS — J00 Acute nasopharyngitis [common cold]: Secondary | ICD-10-CM

## 2012-04-02 MED ORDER — FLUTICASONE PROPIONATE 50 MCG/ACT NA SUSP: 2.0000 | Freq: Every day | NASAL | Status: DC

## 2012-04-16 ENCOUNTER — Ambulatory Visit: Payer: Self-pay | Admitting: Internal Medicine

## 2012-04-30 ENCOUNTER — Encounter: Payer: Self-pay | Admitting: Internal Medicine

## 2012-05-27 ENCOUNTER — Other Ambulatory Visit: Payer: Self-pay | Admitting: *Deleted

## 2012-05-27 MED ORDER — AMLODIPINE BESYLATE 2.5 MG PO TABS: 2.5000 mg | ORAL_TABLET | Freq: Every day | ORAL | Status: DC

## 2012-05-27 NOTE — Telephone Encounter (Signed)
Med filled.  

## 2012-08-05 ENCOUNTER — Other Ambulatory Visit: Payer: Self-pay | Admitting: *Deleted

## 2012-08-05 DIAGNOSIS — I10 Essential (primary) hypertension: Secondary | ICD-10-CM

## 2012-08-05 MED ORDER — ENALAPRIL MALEATE 20 MG PO TABS: 20.0000 mg | ORAL_TABLET | Freq: Two times a day (BID) | ORAL | Status: DC

## 2012-09-07 ENCOUNTER — Ambulatory Visit: Payer: Self-pay | Admitting: Emergency Medicine

## 2012-09-23 ENCOUNTER — Encounter: Payer: Self-pay | Admitting: Internal Medicine

## 2012-09-23 ENCOUNTER — Ambulatory Visit (INDEPENDENT_AMBULATORY_CARE_PROVIDER_SITE_OTHER): Payer: 59 | Admitting: Internal Medicine

## 2012-09-23 VITALS — BP 122/78 | HR 62 | Temp 97.8°F | Resp 14 | Wt 187.2 lb

## 2012-09-23 DIAGNOSIS — E669 Obesity, unspecified: Secondary | ICD-10-CM

## 2012-09-23 DIAGNOSIS — I10 Essential (primary) hypertension: Secondary | ICD-10-CM

## 2012-09-23 DIAGNOSIS — K219 Gastro-esophageal reflux disease without esophagitis: Secondary | ICD-10-CM

## 2012-09-23 DIAGNOSIS — I872 Venous insufficiency (chronic) (peripheral): Secondary | ICD-10-CM

## 2012-09-23 DIAGNOSIS — E785 Hyperlipidemia, unspecified: Secondary | ICD-10-CM

## 2012-09-23 DIAGNOSIS — Z79899 Other long term (current) drug therapy: Secondary | ICD-10-CM

## 2012-09-23 LAB — COMPREHENSIVE METABOLIC PANEL
ALT: 15 U/L (ref 0–35)
Alkaline Phosphatase: 57 U/L (ref 39–117)
Creatinine, Ser: 1 mg/dL (ref 0.4–1.2)
Glucose, Bld: 93 mg/dL (ref 70–99)
Sodium: 139 mEq/L (ref 135–145)
Total Bilirubin: 0.5 mg/dL (ref 0.3–1.2)
Total Protein: 7.2 g/dL (ref 6.0–8.3)

## 2012-09-23 LAB — LIPID PANEL
Cholesterol: 179 mg/dL (ref 0–200)
HDL: 52.2 mg/dL (ref >39.00)
LDL Cholesterol: 91 mg/dL (ref 0–99)
VLDL: 35.4 mg/dL (ref 0.0–40.0)

## 2012-09-23 MED ORDER — OMEPRAZOLE 20 MG PO CPDR: 20.0000 mg | DELAYED_RELEASE_CAPSULE | Freq: Every day | ORAL | Status: DC | PRN

## 2012-09-23 NOTE — Assessment & Plan Note (Signed)
Her weight is slightly up. I have addressed  BMI and recommended more adherence to a low glycemic index diet utilizing smaller more frequent meals to increase metabolism.  I have also recommended that patient start exercising with a goal of 30 minutes of aerobic exercise a minimum of 5 days per week.

## 2012-09-23 NOTE — Patient Instructions (Signed)
You are doing great.   We are checking your choelsterol and liver tests and will e mail you the resuls

## 2012-09-23 NOTE — Assessment & Plan Note (Signed)
LDL but not triglycerides are at goal on current medications. sHe has no side effects and liver enzymes are normal. No changes today.  More exercise recommended.

## 2012-09-23 NOTE — Progress Notes (Signed)
Patient ID: Jillian [name], female   DOB: [birth year], 62 y.o.   MRN: [id number]  Patient Active Problem List   Diagnosis Date Noted  . Routine general medical examination at a health care facility 03/24/2012  . Venous insufficiency of leg 10/23/2011  . Obesity (BMI 30-39.9) 06/18/2011  . Shoulder pain, right 03/15/2011  . Screening for breast cancer 12/14/2010  . Screening for cervical cancer 12/14/2010  . GERD (gastroesophageal reflux disease) 12/14/2010  . Arthritis   . Hyperlipidemia   . Hypertension   . Allergy     Subjective:  CC:   Chief Complaint  Patient presents with  . Follow-up    6 month    HPI:   Jillian [name] a 62 y.o. female who presents 6 month follow up on multiple issues including  hypertension, obesity, hyperlipidemia.   She has been having sleep issues due to foot pain from persistent plantar fasciitis.  She has been using OTC inner soles, which have provided transient relief of pain but are no longer effective. Seeing Clide Cliff on Thursday. Previously treated bor bone spur with injection of cortisone  4 yrs ago.    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 Works 3 to 4 days per week,  Part time,  12 to 8. Spends rest of week staying at home,  Does water aerobics once or twice weekly.    Following a low glycemic diet most days but has difficulty on work days.  Salads bother her GI tract due to IBS.  Doesn't have enough breaks t allow 3 hr snacks.  venous insufficiency:  Lower extremity edema has improved since Dr. Gilda Crease did her ablations.She is only wearing compressio nstockings afger her procedurres,  Not while at [facility],  Cites the 100 degree temperatures in the kitchen as  barrier  Tolerating meds Well.     Past Medical History  Diagnosis Date  . Arthritis     right shoulder,  no intervention  . Hypertension   . Irritable bowel syndrome (IBS) Oct 2011    last colonoscopy showed diverticulum, no polyps  . Hyperlipidemia   . Allergy    seasonal rhinitis  . GERD (gastroesophageal reflux disease)     Past Surgical History  Procedure Laterality Date  . Tubal ligation  1977  . Tonsillectomy and adenoidectomy  1956       The following portions of the patient's history were reviewed and updated as appropriate: Allergies, current medications, and problem list.    Review of Systems:   12 Pt  review of systems was negative except those addressed in the HPI,     History   Social History  . Marital Status: Divorced    Spouse Name: N/A    Number of Children: N/A  . Years of Education: N/A   Occupational History  . cutsodial     works at [employer]   Social History Main Topics  . Smoking status: Former Smoker    Types: Cigarettes    Quit date: 03/16/1999  . Smokeless tobacco: Never Used  . Alcohol Use: No  . Drug Use: No  . Sexually Active: Not Currently   Other Topics Concern  . Not on file   Social History Narrative  . No narrative on file    Objective:  BP 122/78  Pulse 62  Temp(Src) 97.8 F (36.6 C) (Oral)  Resp 14  Wt 187 lb 4 oz (84.936 kg)  BMI 29.32 kg/m2  SpO2 97%  General appearance: alert, cooperative and  appears stated age Ears: normal TM's and external ear canals both ears Throat: lips, mucosa, and tongue normal; teeth and gums normal Neck: no adenopathy, no carotid bruit, supple, symmetrical, trachea midline and thyroid not enlarged, symmetric, no tenderness/mass/nodules Back: symmetric, no curvature. ROM normal. No CVA tenderness. Lungs: clear to auscultation bilaterally Heart: regular rate and rhythm, S1, S2 normal, no murmur, click, rub or gallop Abdomen: soft, non-tender; bowel sounds normal; no masses,  no organomegaly Pulses: 2+ and symmetric Skin: Skin color, texture, turgor normal. No rashes or lesions Lymph nodes: Cervical, supraclavicular, and axillary nodes normal.  Assessment and Plan:  Obesity (BMI 30-39.9) Her weight is slightly up. I have addressed  BMI and  recommended more adherence to a low glycemic index diet utilizing smaller more frequent meals to increase metabolism.  I have also recommended that patient start exercising with a goal of 30 minutes of aerobic exercise a minimum of 5 days per week.   Hypertension Well controlled on current regimen. Renal function stable, no changes today.  Venous insufficiency of leg S.p venous ablation by [facility] .   Hyperlipidemia LDL but not triglycerides are at goal on current medications. sHe has no side effects and liver enzymes are normal. No changes today.  More exercise recommended.     Updated Medication List Outpatient Encounter Prescriptions as of 09/23/2012  Medication Sig Dispense Refill  . amLODipine (NORVASC) 2.5 MG tablet Take 1 tablet (2.5 mg total) by mouth daily.  30 tablet  5  . aspirin 81 MG tablet Take 81 mg by mouth daily.        . Cholecalciferol (VITAMIN D3) 1000 UNITS CAPS Take 1 capsule by mouth daily.        . enalapril (VASOTEC) 20 MG tablet Take 1 tablet (20 mg total) by mouth 2 (two) times daily.  180 tablet  1  . fluticasone (FLONASE) 50 MCG/ACT nasal spray Place 2 sprays into the nose daily.  16 g  6  . ibuprofen (ADVIL,MOTRIN) 200 MG tablet Take 400 mg by mouth every 6 (six) hours as needed.        . magnesium oxide (MAG-OX) 400 MG tablet Take 400 mg by mouth daily.        Marland Kitchen omeprazole (PRILOSEC) 20 MG capsule Take 1 capsule (20 mg total) by mouth daily as needed.  90 capsule  3  . simvastatin (ZOCOR) 40 MG tablet Take 1 tablet (40 mg total) by mouth at bedtime.  30 tablet  11  . [DISCONTINUED] omeprazole (PRILOSEC) 20 MG capsule Take 1 capsule (20 mg total) by mouth daily as needed.  30 capsule  6  . fexofenadine (ALLEGRA) 180 MG tablet Take 1 tablet (180 mg total) by mouth daily. Prn for seasonal allergies  90 tablet  3   No facility-administered encounter medications on file as of 09/23/2012.     Orders Placed This Encounter  Procedures  . Comprehensive metabolic  panel  . Lipid panel    No Follow-up on file.

## 2012-09-23 NOTE — Assessment & Plan Note (Signed)
S.p venous ablation by [facility] .

## 2012-09-23 NOTE — Assessment & Plan Note (Signed)
Well controlled on current regimen. Renal function stable, no changes today. 

## 2012-09-24 ENCOUNTER — Encounter: Payer: Self-pay | Admitting: Internal Medicine

## 2012-12-26 ENCOUNTER — Other Ambulatory Visit: Payer: Self-pay | Admitting: *Deleted

## 2012-12-26 MED ORDER — SIMVASTATIN 40 MG PO TABS: 40.0000 mg | ORAL_TABLET | Freq: Every day | ORAL | Status: DC

## 2012-12-28 ENCOUNTER — Encounter: Payer: Self-pay | Admitting: Internal Medicine

## 2013-01-19 ENCOUNTER — Encounter: Payer: Self-pay | Admitting: Internal Medicine

## 2013-01-19 DIAGNOSIS — Z79899 Other long term (current) drug therapy: Secondary | ICD-10-CM

## 2013-02-02 ENCOUNTER — Encounter: Payer: Self-pay | Admitting: Internal Medicine

## 2013-02-02 ENCOUNTER — Other Ambulatory Visit: Payer: Self-pay | Admitting: *Deleted

## 2013-02-02 DIAGNOSIS — I10 Essential (primary) hypertension: Secondary | ICD-10-CM

## 2013-02-02 MED ORDER — ENALAPRIL MALEATE 20 MG PO TABS: 20.0000 mg | ORAL_TABLET | Freq: Two times a day (BID) | ORAL | Status: DC

## 2013-03-30 ENCOUNTER — Ambulatory Visit (INDEPENDENT_AMBULATORY_CARE_PROVIDER_SITE_OTHER): Payer: 59 | Admitting: Internal Medicine

## 2013-03-30 ENCOUNTER — Encounter: Payer: Self-pay | Admitting: Internal Medicine

## 2013-03-30 VITALS — BP 130/84 | HR 59 | Temp 98.2°F | Resp 20 | Ht 67.0 in | Wt 190.0 lb

## 2013-03-30 DIAGNOSIS — Z1239 Encounter for other screening for malignant neoplasm of breast: Secondary | ICD-10-CM

## 2013-03-30 DIAGNOSIS — Z79899 Other long term (current) drug therapy: Secondary | ICD-10-CM

## 2013-03-30 DIAGNOSIS — E663 Overweight: Secondary | ICD-10-CM

## 2013-03-30 DIAGNOSIS — M545 Low back pain, unspecified: Secondary | ICD-10-CM

## 2013-03-30 DIAGNOSIS — R5381 Other malaise: Secondary | ICD-10-CM

## 2013-03-30 DIAGNOSIS — G4700 Insomnia, unspecified: Secondary | ICD-10-CM

## 2013-03-30 DIAGNOSIS — R5383 Other fatigue: Secondary | ICD-10-CM

## 2013-03-30 DIAGNOSIS — E785 Hyperlipidemia, unspecified: Secondary | ICD-10-CM

## 2013-03-30 DIAGNOSIS — I10 Essential (primary) hypertension: Secondary | ICD-10-CM

## 2013-03-30 DIAGNOSIS — E559 Vitamin D deficiency, unspecified: Secondary | ICD-10-CM

## 2013-03-30 LAB — COMPREHENSIVE METABOLIC PANEL
ALT: 17 U/L (ref 0–35)
AST: 21 U/L (ref 0–37)
Albumin: 4.3 g/dL (ref 3.5–5.2)
Alkaline Phosphatase: 71 U/L (ref 39–117)
BUN: 29 mg/dL — AB (ref 6–23)
CALCIUM: 9.7 mg/dL (ref 8.4–10.5)
CHLORIDE: 108 meq/L (ref 96–112)
CO2: 24 meq/L (ref 19–32)
CREATININE: 1.1 mg/dL (ref 0.4–1.2)
GFR: 51.58 mL/min — AB (ref >60.00)
Glucose, Bld: 89 mg/dL (ref 70–99)
POTASSIUM: 4.5 meq/L (ref 3.5–5.1)
Sodium: 140 mEq/L (ref 135–145)
Total Bilirubin: 0.5 mg/dL (ref 0.3–1.2)
Total Protein: 7.2 g/dL (ref 6.0–8.3)

## 2013-03-30 LAB — TSH: TSH: 2.32 u[IU]/mL (ref 0.35–5.50)

## 2013-03-30 LAB — CBC WITH DIFFERENTIAL/PLATELET
BASOS ABS: 0 10*3/uL (ref 0.0–0.1)
Basophils Relative: 0.4 % (ref 0.0–3.0)
EOS ABS: 0.2 10*3/uL (ref 0.0–0.7)
Eosinophils Relative: 1.7 % (ref 0.0–5.0)
HCT: 38.8 % (ref 36.0–46.0)
Hemoglobin: 12.8 g/dL (ref 12.0–15.0)
LYMPHS PCT: 33.7 % (ref 12.0–46.0)
Lymphs Abs: 3.1 10*3/uL (ref 0.7–4.0)
MCHC: 33 g/dL (ref 30.0–36.0)
MCV: 87.7 fl (ref 78.0–100.0)
Monocytes Absolute: 0.7 10*3/uL (ref 0.1–1.0)
Monocytes Relative: 7.2 % (ref 3.0–12.0)
Neutro Abs: 5.2 10*3/uL (ref 1.4–7.7)
Neutrophils Relative %: 57 % (ref 43.0–77.0)
Platelets: 220 10*3/uL (ref 150.0–400.0)
RBC: 4.43 Mil/uL (ref 3.87–5.11)
RDW: 14.4 % (ref 11.5–14.6)
WBC: 9.2 10*3/uL (ref 4.5–10.5)

## 2013-03-30 LAB — LIPID PANEL
Cholesterol: 177 mg/dL (ref 0–200)
HDL: 56.4 mg/dL (ref >39.00)
LDL Cholesterol: 93 mg/dL (ref 0–99)
Total CHOL/HDL Ratio: 3
Triglycerides: 140 mg/dL (ref 0.0–149.0)
VLDL: 28 mg/dL (ref 0.0–40.0)

## 2013-03-30 MED ORDER — AMLODIPINE BESYLATE 2.5 MG PO TABS: 2.5000 mg | ORAL_TABLET | Freq: Every day | ORAL | Status: DC

## 2013-03-30 MED ORDER — ALPRAZOLAM 0.25 MG PO TABS: 0.2500 mg | ORAL_TABLET | Freq: Every evening | ORAL | Status: DC | PRN

## 2013-03-30 NOTE — Progress Notes (Signed)
Patient ID: Jillian [name], female   DOB: [birth year], 63 y.o.   MRN: [id number]  Patient Active Problem List   Diagnosis Date Noted  . Insomnia 03/30/2013  . Lumbago 03/30/2013  . Routine general medical examination at a health care facility 03/24/2012  . Venous insufficiency of leg 10/23/2011  . Overweight 06/18/2011  . Shoulder pain, right 03/15/2011  . Screening for breast cancer 12/14/2010  . Screening for cervical cancer 12/14/2010  . GERD (gastroesophageal reflux disease) 12/14/2010  . Arthritis   . Hyperlipidemia   . Hypertension   . Allergy     Subjective:  CC:   Chief Complaint  Patient presents with  . Follow-up    HPI:   Tyrell Hemphillis a 63 y.o. female who presents for follow up on chronic conditions.  Insomnia,  Works 12:30 8:30  And home by  9 pm.  tries to sleep from 12 mn  After eating dinner showering,  Sometimes e mail or watches TV or crochets.,  Often lies awake till 5:30 am no TV in bed   Tylenol PM even half dose leaves her groggy in the am .    Back pain: right lateral lumbar area aggravated by prolonged standing .  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   Feels like a catch when she turns. .  No radiation.   Right shoulder pain.  she has a  history of spur aggravated by cold   Trying to lose weight following a low glycemic index diet. ,  Gained some weight over the holidays.  Using the treadmill. .  Using veggie burgers without buns,  Snacking on nuts ,  using dannon  [place] yogurt.    Past Medical History  Diagnosis Date  . Arthritis     right shoulder,  no intervention  . Hypertension   . Irritable bowel syndrome (IBS) Oct 2011    last colonoscopy showed diverticulum, no polyps  . Hyperlipidemia   . Allergy     seasonal rhinitis  . GERD (gastroesophageal reflux disease)     Past Surgical History  Procedure Laterality Date  . Tubal ligation  1977  . Tonsillectomy and adenoidectomy  1956       The  following portions of the patient's history were reviewed and updated as appropriate: Allergies, current medications, and problem list.    Review of Systems:   12 Pt  review of systems was negative except those addressed in the HPI,     History   Social History  . Marital Status: Divorced    Spouse Name: N/A    Number of Children: N/A  . Years of Education: N/A   Occupational History  . cutsodial     works at [employer]  . Smoking status: Former Smoker    Types: Cigarettes    Quit date: 03/16/1999  . Smokeless tobacco: Never Used  . Alcohol Use: No  . Drug Use: No  . Sexual Activity: Not Currently   Other Topics Concern  . Not on file   Social History Narrative  . No narrative on file    Objective:  Filed Vitals:   03/30/13 0802  BP: 130/84  Pulse: 59  Temp: 98.2 F (36.8 C)  Resp: 20     General appearance: alert, cooperative and appears stated age Ears: normal TM's and external ear canals both ears Throat: lips, mucosa, and tongue normal; teeth and gums normal Neck: no adenopathy, no carotid bruit, supple,  symmetrical, trachea midline and thyroid not enlarged, symmetric, no tenderness/mass/nodules Back: symmetric, no curvature. ROM normal. No CVA tenderness. Lungs: clear to auscultation bilaterally Heart: regular rate and rhythm, S1, S2 normal, no murmur, click, rub or gallop Abdomen: soft, non-tender; bowel sounds normal; no masses,  no organomegaly Pulses: 2+ and symmetric Skin: Skin color, texture, turgor normal. No rashes or lesions Lymph nodes: Cervical, supraclavicular, and axillary nodes normal.  Assessment and Plan:  Insomnia Trial of low dose Alprazolam   Hypertension Well controlled on current regimen. Renal function stable, no changes today.  Overweight I have congratulated her in reduction of   BMI and encouraged  Continued weight loss with goal of 10% of body weight over the next 6 months using a low  glycemic index diet and regular exercise a minimum of 5 days per week.    Hyperlipidemia Well controlled on current statin therapy.   Liver enzymes are normal , no changes today.  Lab Results  Component Value Date   CHOL 177 03/30/2013   HDL 56.40 03/30/2013   LDLCALC 93 03/30/2013   TRIG 140.0 03/30/2013   CHOLHDL 3 03/30/2013   Lab Results  Component Value Date   ALT 17 03/30/2013   AST 21 03/30/2013   ALKPHOS 71 03/30/2013   BILITOT 0.5 03/30/2013     Lumbago intermittent , episodic.  nonradiating core strengthening exercises recommended   Updated Medication List Outpatient Encounter Prescriptions as of 03/30/2013  Medication Sig  . amLODipine (NORVASC) 2.5 MG tablet Take 1 tablet (2.5 mg total) by mouth daily.  Marland Kitchen aspirin 81 MG tablet Take 81 mg by mouth daily.    . Cholecalciferol (VITAMIN D3) 1000 UNITS CAPS Take 1 capsule by mouth daily.    . enalapril (VASOTEC) 20 MG tablet Take 1 tablet (20 mg total) by mouth 2 (two) times daily.  . fluticasone (FLONASE) 50 MCG/ACT nasal spray Place 2 sprays into the nose daily.  Marland Kitchen ibuprofen (ADVIL,MOTRIN) 200 MG tablet Take 400 mg by mouth every 6 (six) hours as needed.    . loratadine (CLARITIN) 10 MG tablet Take 10 mg by mouth daily.  . magnesium oxide (MAG-OX) 400 MG tablet Take 400 mg by mouth daily.    Marland Kitchen omeprazole (PRILOSEC) 20 MG capsule Take 1 capsule (20 mg total) by mouth daily as needed.  . simvastatin (ZOCOR) 40 MG tablet Take 1 tablet (40 mg total) by mouth at bedtime.  . [DISCONTINUED] amLODipine (NORVASC) 2.5 MG tablet Take 1 tablet (2.5 mg total) by mouth daily.  Marland Kitchen ALPRAZolam (XANAX) 0.25 MG tablet Take 1 tablet (0.25 mg total) by mouth at bedtime as needed for sleep.  . [DISCONTINUED] fexofenadine (ALLEGRA) 180 MG tablet Take 1 tablet (180 mg total) by mouth daily. Prn for seasonal allergies     Orders Placed This Encounter  Procedures  . CBC with Differential  . Comprehensive metabolic panel  . TSH  . Lipid panel  . Vit D   25 hydroxy (rtn osteoporosis monitoring)    No Follow-up on file.

## 2013-03-30 NOTE — Assessment & Plan Note (Signed)
Well controlled on current regimen. Renal function stable, no changes today. 

## 2013-03-30 NOTE — Assessment & Plan Note (Addendum)
Trial of low dose Alprazolam

## 2013-03-30 NOTE — Assessment & Plan Note (Addendum)
Well controlled on current statin therapy.   Liver enzymes are normal , no changes today.  Lab Results  Component Value Date   CHOL 177 03/30/2013   HDL 56.40 03/30/2013   LDLCALC 93 03/30/2013   TRIG 140.0 03/30/2013   CHOLHDL 3 03/30/2013   Lab Results  Component Value Date   ALT 17 03/30/2013   AST 21 03/30/2013   ALKPHOS 71 03/30/2013   BILITOT 0.5 03/30/2013

## 2013-03-30 NOTE — Patient Instructions (Signed)
Managing Your High Blood Pressure Blood pressure is a measurement of how forceful your blood is pressing against the walls of the arteries. Arteries are muscular tubes within the circulatory system. Blood pressure does not stay the same. Blood pressure rises when you are active, excited, or nervous; and it lowers during sleep and relaxation. If the numbers measuring your blood pressure stay above normal most of the time, you are at risk for health problems. High blood pressure (hypertension) is a long-term (chronic) condition in which blood pressure is elevated. A blood pressure reading is recorded as two numbers, such as 120 over 80 (or 120/80). The first, higher number is called the systolic pressure. It is a measure of the pressure in your arteries as the heart beats. The second, lower number is called the diastolic pressure. It is a measure of the pressure in your arteries as the heart relaxes between beats.  Keeping your blood pressure in a normal range is important to your overall health and prevention of health problems, such as heart disease and stroke. When your blood pressure is uncontrolled, your heart has to work harder than normal. High blood pressure is a very common condition in adults because blood pressure tends to rise with age. Men and women are equally likely to have hypertension but at different times in life. Before age 45, men are more likely to have hypertension. After 64 years of age, women are more likely to have it. Hypertension is especially common in African Americans. This condition often has no signs or symptoms. The cause of the condition is usually not known. Your caregiver can help you come up with a plan to keep your blood pressure in a normal, healthy range. BLOOD PRESSURE STAGES Blood pressure is classified into four stages: normal, prehypertension, stage 1, and stage 2. Your blood pressure reading will be used to determine what type of treatment, if any, is necessary.  Appropriate treatment options are tied to these four stages:  Normal  Systolic pressure (mm Hg): below 120.  Diastolic pressure (mm Hg): below 80. Prehypertension  Systolic pressure (mm Hg): 120 to 139.  Diastolic pressure (mm Hg): 80 to 89. Stage1  Systolic pressure (mm Hg): 140 to 159.  Diastolic pressure (mm Hg): 90 to 99. Stage2  Systolic pressure (mm Hg): 160 or above.  Diastolic pressure (mm Hg): 100 or above. RISKS RELATED TO HIGH BLOOD PRESSURE Managing your blood pressure is an important responsibility. Uncontrolled high blood pressure can lead to:  A heart attack.  A stroke.  A weakened blood vessel (aneurysm).  Heart failure.  Kidney damage.  Eye damage.  Metabolic syndrome.  Memory and concentration problems. HOW TO MANAGE YOUR BLOOD PRESSURE Blood pressure can be managed effectively with lifestyle changes and medicines (if needed). Your caregiver will help you come up with a plan to bring your blood pressure within a normal range. Your plan should include the following: Education  Read all information provided by your caregivers about how to control blood pressure.  Educate yourself on the latest guidelines and treatment recommendations. New research is always being done to further define the risks and treatments for high blood pressure. Lifestylechanges  Control your weight.  Avoid smoking.  Stay physically active.  Reduce the amount of salt in your diet.  Reduce stress.  Control any chronic conditions, such as high cholesterol or diabetes.  Reduce your alcohol intake. Medicines  Several medicines (antihypertensive medicines) are available, if needed, to bring blood pressure within a normal range.  Communication  Review all the medicines you take with your caregiver because there may be side effects or interactions.  Talk with your caregiver about your diet, exercise habits, and other lifestyle factors that may be contributing to  high blood pressure.  See your caregiver regularly. Your caregiver can help you create and adjust your plan for managing high blood pressure. RECOMMENDATIONS FOR TREATMENT AND FOLLOW-UP  The following recommendations are based on current guidelines for managing high blood pressure in nonpregnant adults. Use these recommendations to identify the proper follow-up period or treatment option based on your blood pressure reading. You can discuss these options with your caregiver.  Systolic pressure of 542 to 706 or diastolic pressure of 80 to 89: Follow up with your caregiver as directed.  Systolic pressure of 237 to 628 or diastolic pressure of 90 to 100: Follow up with your caregiver within 2 months.  Systolic pressure above 315 or diastolic pressure above 176: Follow up with your caregiver within 1 month.  Systolic pressure above 160 or diastolic pressure above 737: Consider antihypertensive therapy; follow up with your caregiver within 1 week.  Systolic pressure above 106 or diastolic pressure above 269: Begin antihypertensive therapy; follow up with your caregiver within 1 week. Document Released: 11/07/2011 Document Reviewed: 11/07/2011 [facility] Patient Information 2014 [place], [place].  Lumbosacral Strain Lumbosacral strain is a strain of any of the parts that make up your lumbosacral vertebrae. Your lumbosacral vertebrae are the bones that make up the lower third of your backbone. Your lumbosacral vertebrae are held together by muscles and tough, fibrous tissue (ligaments).  CAUSES  A sudden blow to your back can cause lumbosacral strain. Also, anything that causes an excessive stretch of the muscles in the low back can cause this strain. This is typically seen when people exert themselves strenuously, fall, lift heavy objects, bend, or crouch repeatedly. RISK FACTORS  Physically demanding work.  Participation in pushing or pulling sports or sports that require sudden twist of the back  (tennis, golf, baseball).  Weight lifting.  Excessive lower back curvature.  Forward-tilted pelvis.  Weak back or abdominal muscles or both.  Tight hamstrings. SIGNS AND SYMPTOMS  Lumbosacral strain may cause pain in the area of your injury or pain that moves (radiates) down your leg.  DIAGNOSIS Your health care provider can often diagnose lumbosacral strain through a physical exam. In some cases, you may need tests such as X-ray exams.  TREATMENT  Treatment for your lower back injury depends on many factors that your clinician will have to evaluate. However, most treatment will include the use of anti-inflammatory medicines. HOME CARE INSTRUCTIONS   Avoid hard physical activities (tennis, racquetball, waterskiing) if you are not in proper physical condition for it. This may aggravate or create problems.  If you have a back problem, avoid sports requiring sudden body movements. Swimming and walking are generally safer activities.  Maintain good posture.  Maintain a healthy weight.  For acute conditions, you may put ice on the injured area.  Put ice in a plastic bag.  Place a towel between your skin and the bag.  Leave the ice on for 20 minutes, 2 3 times a day.  When the low back starts healing, stretching and strengthening exercises may be recommended. SEEK MEDICAL CARE IF:  Your back pain is getting worse.  You experience severe back pain not relieved with medicines. SEEK IMMEDIATE MEDICAL CARE IF:   You have numbness, tingling, weakness, or problems with the use of your  arms or legs.  There is a change in bowel or bladder control.  You have increasing pain in any area of the body, including your belly (abdomen).  You notice shortness of breath, dizziness, or feel faint.  You feel sick to your stomach (nauseous), are throwing up (vomiting), or become sweaty.  You notice discoloration of your toes or legs, or your feet get very cold. MAKE SURE YOU:    Understand these instructions.  Will watch your condition.  Will get help right away if you are not doing well or get worse. Document Released: 11/22/2004 Document Revised: 12/03/2012 Document Reviewed: 10/01/2012 [facility] Patient Information 2014 [place], [place].

## 2013-03-30 NOTE — Progress Notes (Signed)
Pre-visit discussion using our clinic review tool. No additional management support is needed unless otherwise documented below in the visit note.  

## 2013-03-30 NOTE — Assessment & Plan Note (Addendum)
intermittent , episodic.  nonradiating core strengthening exercises recommended

## 2013-03-30 NOTE — Assessment & Plan Note (Signed)
I have congratulated her in reduction of   BMI and encouraged  Continued weight loss with goal of 10% of body weight over the next 6 months using a low glycemic index diet and regular exercise a minimum of 5 days per week.     

## 2013-03-31 ENCOUNTER — Telehealth: Payer: Self-pay | Admitting: Internal Medicine

## 2013-03-31 LAB — VITAMIN D 25 HYDROXY (VIT D DEFICIENCY, FRACTURES): Vit D, 25-Hydroxy: 49 ng/mL (ref 30–89)

## 2013-03-31 NOTE — Telephone Encounter (Signed)
Relevant patient education assigned to patient using Emmi. ° °

## 2013-04-01 ENCOUNTER — Encounter: Payer: Self-pay | Admitting: Internal Medicine

## 2013-04-03 NOTE — Telephone Encounter (Signed)
Mailed unread message to pt  

## 2013-06-04 ENCOUNTER — Telehealth: Payer: Self-pay | Admitting: Internal Medicine

## 2013-06-04 DIAGNOSIS — Z1239 Encounter for other screening for malignant neoplasm of breast: Secondary | ICD-10-CM

## 2013-06-04 NOTE — Telephone Encounter (Signed)
Needing an order for a mammogram.

## 2013-06-04 NOTE — Telephone Encounter (Signed)
Please advise 

## 2013-06-04 NOTE — Telephone Encounter (Signed)
mammo ordered.

## 2013-06-04 NOTE — Telephone Encounter (Signed)
Left detailed message per DPR that mammo has been ordered

## 2013-07-06 ENCOUNTER — Ambulatory Visit: Payer: Self-pay | Admitting: Internal Medicine

## 2013-07-08 ENCOUNTER — Other Ambulatory Visit: Payer: Self-pay | Admitting: Internal Medicine

## 2013-07-22 ENCOUNTER — Other Ambulatory Visit: Payer: Self-pay | Admitting: Internal Medicine

## 2013-07-29 ENCOUNTER — Encounter: Payer: Self-pay | Admitting: Internal Medicine

## 2013-09-28 ENCOUNTER — Encounter: Payer: 59 | Admitting: Internal Medicine

## 2013-10-19 ENCOUNTER — Encounter: Payer: 59 | Admitting: Internal Medicine

## 2013-11-25 ENCOUNTER — Ambulatory Visit (INDEPENDENT_AMBULATORY_CARE_PROVIDER_SITE_OTHER): Payer: 59 | Admitting: Internal Medicine

## 2013-11-25 ENCOUNTER — Telehealth: Payer: Self-pay | Admitting: *Deleted

## 2013-11-25 ENCOUNTER — Encounter: Payer: Self-pay | Admitting: Internal Medicine

## 2013-11-25 VITALS — BP 116/78 | HR 85 | Temp 98.3°F | Resp 16 | Ht 66.5 in | Wt 194.0 lb

## 2013-11-25 DIAGNOSIS — K2100 Gastro-esophageal reflux disease with esophagitis, without bleeding: Secondary | ICD-10-CM

## 2013-11-25 DIAGNOSIS — I10 Essential (primary) hypertension: Secondary | ICD-10-CM

## 2013-11-25 DIAGNOSIS — Z79899 Other long term (current) drug therapy: Secondary | ICD-10-CM

## 2013-11-25 DIAGNOSIS — E663 Overweight: Secondary | ICD-10-CM

## 2013-11-25 DIAGNOSIS — Z0000 Encounter for general adult medical examination without abnormal findings: Secondary | ICD-10-CM

## 2013-11-25 DIAGNOSIS — E785 Hyperlipidemia, unspecified: Secondary | ICD-10-CM

## 2013-11-25 NOTE — Assessment & Plan Note (Signed)
Annual comprehensive exam was done including breast, excluding pelvic and PAP smear. All screenings have been addressed and a printed health maintenance schedule was given to patient.   

## 2013-11-25 NOTE — Patient Instructions (Signed)
Do not take prilosec if you don't need it.  You can also try using famotidine or ranitidine (H2 blockers) instead for management of reflux  Try taking benadryl 25 mg  At night to manage the drainage,  And continue flonase during the day for allergies  When you turn 65 we'll offer you the pneumonia vaccine that medicare will pay for  You received  the flu vaccine today   We are checking liver and kidney function today  You will start to lose weight when you add the 30 minutes of exercise daily!  Health Maintenance Adopting a healthy lifestyle and getting preventive care can go a long way to promote health and wellness. Talk with your health care provider about what schedule of regular examinations is right for you. This is a good chance for you to check in with your provider about disease prevention and staying healthy. In between checkups, there are plenty of things you can do on your own. Experts have done a lot of research about which lifestyle changes and preventive measures are most likely to keep you healthy. Ask your health care provider for more information. WEIGHT AND DIET  Eat a healthy diet  Be sure to include plenty of vegetables, fruits, low-fat dairy products, and lean protein.  Do not eat a lot of foods high in solid fats, added sugars, or salt.  Get regular exercise. This is one of the most important things you can do for your health.  Most adults should exercise for at least 150 minutes each week. The exercise should increase your heart rate and make you sweat (moderate-intensity exercise).  Most adults should also do strengthening exercises at least twice a week. This is in addition to the moderate-intensity exercise.  Maintain a healthy weight  Body mass index (BMI) is a measurement that can be used to identify possible weight problems. It estimates body fat based on height and weight. Your health care provider can help determine your BMI and help you achieve or  maintain a healthy weight.  For females 67 years of age and older:   A BMI below 18.5 is considered underweight.  A BMI of 18.5 to 24.9 is normal.  A BMI of 25 to 29.9 is considered overweight.  A BMI of 30 and above is considered obese.  Watch levels of cholesterol and blood lipids  You should start having your blood tested for lipids and cholesterol at 64 years of age, then have this test every 5 years.  You may need to have your cholesterol levels checked more often if:  Your lipid or cholesterol levels are high.  You are older than 64 years of age.  You are at high risk for heart disease.  CANCER SCREENING   Lung Cancer  Lung cancer screening is recommended for adults 65-71 years old who are at high risk for lung cancer because of a history of smoking.  A yearly low-dose CT scan of the lungs is recommended for people who:  Currently smoke.  Have quit within the past 15 years.  Have at least a 30-pack-year history of smoking. A pack year is smoking an average of one pack of cigarettes a day for 1 year.  Yearly screening should continue until it has been 15 years since you quit.  Yearly screening should stop if you develop a health problem that would prevent you from having lung cancer treatment.  Breast Cancer  Practice breast self-awareness. This means understanding how your breasts normally appear and  feel.  It also means doing regular breast self-exams. Let your health care provider know about any changes, no matter how small.  If you are in your 20s or 30s, you should have a clinical breast exam (CBE) by a health care provider every 1-3 years as part of a regular health exam.  If you are 50 or older, have a CBE every year. Also consider having a breast X-ray (mammogram) every year.  If you have a family history of breast cancer, talk to your health care provider about genetic screening.  If you are at high risk for breast cancer, talk to your health care  provider about having an MRI and a mammogram every year.  Breast cancer gene (BRCA) assessment is recommended for women who have family members with BRCA-related cancers. BRCA-related cancers include:  Breast.  Ovarian.  Tubal.  Peritoneal cancers.  Results of the assessment will determine the need for genetic counseling and BRCA1 and BRCA2 testing. Cervical Cancer Routine pelvic examinations to screen for cervical cancer are no longer recommended for nonpregnant women who are considered low risk for cancer of the pelvic organs (ovaries, uterus, and vagina) and who do not have symptoms. A pelvic examination may be necessary if you have symptoms including those associated with pelvic infections. Ask your health care provider if a screening pelvic exam is right for you.   The Pap test is the screening test for cervical cancer for women who are considered at risk.  If you had a hysterectomy for a problem that was not cancer or a condition that could lead to cancer, then you no longer need Pap tests.  If you are older than 65 years, and you have had normal Pap tests for the past 10 years, you no longer need to have Pap tests.  If you have had past treatment for cervical cancer or a condition that could lead to cancer, you need Pap tests and screening for cancer for at least 20 years after your treatment.  If you no longer get a Pap test, assess your risk factors if they change (such as having a new sexual partner). This can affect whether you should start being screened again.  Some women have medical problems that increase their chance of getting cervical cancer. If this is the case for you, your health care provider may recommend more frequent screening and Pap tests.  The human papillomavirus (HPV) test is another test that may be used for cervical cancer screening. The HPV test looks for the virus that can cause cell changes in the cervix. The cells collected during the Pap test can be  tested for HPV.  The HPV test can be used to screen women 15 years of age and older. Getting tested for HPV can extend the interval between normal Pap tests from three to five years.  An HPV test also should be used to screen women of any age who have unclear Pap test results.  After 64 years of age, women should have HPV testing as often as Pap tests.  Colorectal Cancer  This type of cancer can be detected and often prevented.  Routine colorectal cancer screening usually begins at 64 years of age and continues through 63 years of age.  Your health care provider may recommend screening at an earlier age if you have risk factors for colon cancer.  Your health care provider may also recommend using home test kits to check for hidden blood in the stool.  A small camera  at the end of a tube can be used to examine your colon directly (sigmoidoscopy or colonoscopy). This is done to check for the earliest forms of colorectal cancer.  Routine screening usually begins at age 79.  Direct examination of the colon should be repeated every 5-10 years through 64 years of age. However, you may need to be screened more often if early forms of precancerous polyps or small growths are found. Skin Cancer  Check your skin from head to toe regularly.  Tell your health care provider about any new moles or changes in moles, especially if there is a change in a mole's shape or color.  Also tell your health care provider if you have a mole that is larger than the size of a pencil eraser.  Always use sunscreen. Apply sunscreen liberally and repeatedly throughout the day.  Protect yourself by wearing long sleeves, pants, a wide-brimmed hat, and sunglasses whenever you are outside. HEART DISEASE, DIABETES, AND HIGH BLOOD PRESSURE   Have your blood pressure checked at least every 1-2 years. High blood pressure causes heart disease and increases the risk of stroke.  If you are between 59 years and 36 years  old, ask your health care provider if you should take aspirin to prevent strokes.  Have regular diabetes screenings. This involves taking a blood sample to check your fasting blood sugar level.  If you are at a normal weight and have a low risk for diabetes, have this test once every three years after 64 years of age.  If you are overweight and have a high risk for diabetes, consider being tested at a younger age or more often. PREVENTING INFECTION  Hepatitis B  If you have a higher risk for hepatitis B, you should be screened for this virus. You are considered at high risk for hepatitis B if:  You were born in a country where hepatitis B is common. Ask your health care provider which countries are considered high risk.  Your parents were born in a high-risk country, and you have not been immunized against hepatitis B (hepatitis B vaccine).  You have HIV or AIDS.  You use needles to inject street drugs.  You live with someone who has hepatitis B.  You have had sex with someone who has hepatitis B.  You get hemodialysis treatment.  You take certain medicines for conditions, including cancer, organ transplantation, and autoimmune conditions. Hepatitis C  Blood testing is recommended for:  Everyone born from 60 through 1965.  Anyone with known risk factors for hepatitis C. Sexually transmitted infections (STIs)  You should be screened for sexually transmitted infections (STIs) including gonorrhea and chlamydia if:  You are sexually active and are younger than 64 years of age.  You are older than 64 years of age and your health care provider tells you that you are at risk for this type of infection.  Your sexual activity has changed since you were last screened and you are at an increased risk for chlamydia or gonorrhea. Ask your health care provider if you are at risk.  If you do not have HIV, but are at risk, it may be recommended that you take a prescription medicine  daily to prevent HIV infection. This is called pre-exposure prophylaxis (PrEP). You are considered at risk if:  You are sexually active and do not regularly use condoms or know the HIV status of your partner(s).  You take drugs by injection.  You are sexually active with a partner  who has HIV. Talk with your health care provider about whether you are at high risk of being infected with HIV. If you choose to begin PrEP, you should first be tested for HIV. You should then be tested every 3 months for as long as you are taking PrEP.  PREGNANCY   If you are premenopausal and you may become pregnant, ask your health care provider about preconception counseling.  If you may become pregnant, take 400 to 800 micrograms (mcg) of folic acid every day.  If you want to prevent pregnancy, talk to your health care provider about birth control (contraception). OSTEOPOROSIS AND MENOPAUSE   Osteoporosis is a disease in which the bones lose minerals and strength with aging. This can result in serious bone fractures. Your risk for osteoporosis can be identified using a bone density scan.  If you are 59 years of age or older, or if you are at risk for osteoporosis and fractures, ask your health care provider if you should be screened.  Ask your health care provider whether you should take a calcium or vitamin D supplement to lower your risk for osteoporosis.  Menopause may have certain physical symptoms and risks.  Hormone replacement therapy may reduce some of these symptoms and risks. Talk to your health care provider about whether hormone replacement therapy is right for you.  HOME CARE INSTRUCTIONS   Schedule regular health, dental, and eye exams.  Stay current with your immunizations.   Do not use any tobacco products including cigarettes, chewing tobacco, or electronic cigarettes.  If you are pregnant, do not drink alcohol.  If you are breastfeeding, limit how much and how often you drink  alcohol.  Limit alcohol intake to no more than 1 drink per day for nonpregnant women. One drink equals 12 ounces of beer, 5 ounces of wine, or 1 ounces of hard liquor.  Do not use street drugs.  Do not share needles.  Ask your health care provider for help if you need support or information about quitting drugs.  Tell your health care provider if you often feel depressed.  Tell your health care provider if you have ever been abused or do not feel safe at home. Document Released: 08/28/2010 Document Revised: 06/29/2013 Document Reviewed: 01/14/2013 [facility] Patient Information 2015 [place], [place]. This information is not intended to replace advice given to you by your health care provider. Make sure you discuss any questions you have with your health care provider.

## 2013-11-25 NOTE — Assessment & Plan Note (Signed)
I have addressed  BMI and recommended wt loss of 10% of body weigh over the next 6 months using a low glycemic index diet and regular exercise a minimum of 5 days per week.   

## 2013-11-25 NOTE — Progress Notes (Signed)
Patient ID: Jillian [name], female   DOB: 05/28/1950, 63 y.o.   MRN: [id number]    Subjective:     Jillian [name] is a 63 y.o. female and is here for a comprehensive physical exam. The patient reports no problems.  History   Social History  . Marital Status: Divorced    Spouse Name: N/A    Number of Children: N/A  . Years of Education: N/A   Occupational History  . 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 Smoking status: Former Smoker    Types: Cigarettes    Quit date: 03/16/1999  . Smokeless tobacco: Never Used  . Alcohol Use: No  . Drug Use: No  . Sexual Activity: Not Currently   Other Topics Concern  . Not on file   Social History Narrative  . No narrative on file   Health Maintenance  Topic Date Due  . Tetanus/tdap  07/31/1968  . Zostavax  07/31/2009  . Influenza Vaccine  09/26/2013  . Pap Smear  03/28/2015  . Mammogram  07/07/2015  . Colonoscopy  12/18/2018    The following portions of the patient's history were reviewed and updated as appropriate: allergies, current medications, past family history, past medical history, past social history, past surgical history and problem list.  Review of Systems A comprehensive review of systems was negative.   Objective:   BP 116/78  Pulse 85  Temp(Src) 98.3 F (36.8 C) (Oral)  Resp 16  Ht 5' 6.5" (1.689 m)  Wt 194 lb (87.998 kg)  BMI 30.85 kg/m2  SpO2 97%  General appearance: alert, cooperative and appears stated age Head: Normocephalic, without obvious abnormality, atraumatic Eyes: conjunctivae/corneas clear. PERRL, EOM's intact. Fundi benign. Ears: normal TM's and external ear canals both ears Nose: Nares normal. Septum midline. Mucosa normal. No drainage or sinus tenderness. Throat: lips, mucosa, and tongue normal; teeth and gums normal Neck: no adenopathy, no carotid bruit, no JVD, supple, symmetrical, trachea midline and thyroid not enlarged, symmetric, no  tenderness/mass/nodules Lungs: clear to auscultation bilaterally Breasts: normal appearance, no masses or tenderness Heart: regular rate and rhythm, S1, S2 normal, no murmur, click, rub or gallop Abdomen: soft, non-tender; bowel sounds normal; no masses,  no organomegaly Extremities: extremities normal, atraumatic, no cyanosis or edema Pulses: 2+ and symmetric Skin: Skin color, texture, turgor normal. No rashes or lesions Neurologic: Alert and oriented X 3, normal strength and tone. Normal symmetric reflexes. Normal coordination and gait.    Assessment and Plan:    Routine general medical examination at a health care facility Annual comprehensive exam was done including breast, excluding pelvic and PAP smear. All screenings have been addressed and a printed health maintenance schedule was given to patient.    Overweight I have addressed  BMI and recommended wt loss of 10% of body weigh over the next 6 months using a low glycemic index diet and regular exercise a minimum of 5 days per week.    Hyperlipidemia LDL and triglycerides have been at goal on current medications. She has no side effects and  Repeat labs are due .  No changes today  Lab Results  Component Value Date   CHOL 177 03/30/2013   HDL 56.40 03/30/2013   LDLCALC 93 03/30/2013   TRIG 140.0 03/30/2013   CHOLHDL 3 03/30/2013     Hypertension Well controlled on current regimen. Renal function  Is due . no changes today.  GERD (gastroesophageal reflux disease) Discussed  patient's concern about recent AARP article warning of increased risk of CAD associated with daily PPI use in patients over 60.  Recommended suspending medication if reflux symptoms have resolved.     Updated Medication List Outpatient Encounter Prescriptions as of 11/25/2013  Medication Sig  . ALPRAZolam (XANAX) 0.25 MG tablet Take 1 tablet (0.25 mg total) by mouth at bedtime as needed for sleep.  Marland Kitchen amLODipine (NORVASC) 2.5 MG tablet Take 1 tablet (2.5 mg  total) by mouth daily.  Marland Kitchen aspirin 81 MG tablet Take 81 mg by mouth daily.    . Cholecalciferol (VITAMIN D3) 1000 UNITS CAPS Take 1 capsule by mouth daily.    . enalapril (VASOTEC) 20 MG tablet Take 1 tablet (20 mg total) by mouth 2 (two) times daily.  . fluticasone (FLONASE) 50 MCG/ACT nasal spray Place 2 sprays into the nose daily.  Marland Kitchen ibuprofen (ADVIL,MOTRIN) 200 MG tablet Take 400 mg by mouth every 6 (six) hours as needed.    . loratadine (CLARITIN) 10 MG tablet Take 10 mg by mouth daily.  . magnesium oxide (MAG-OX) 400 MG tablet Take 400 mg by mouth daily.    . simvastatin (ZOCOR) 40 MG tablet Take 1 tablet (40 mg total) by mouth at bedtime.  Marland Kitchen omeprazole (PRILOSEC) 20 MG capsule Take 1 capsule (20 mg total) by mouth daily as needed.

## 2013-11-25 NOTE — Assessment & Plan Note (Signed)
Well controlled on current regimen. Renal function  Is due . no changes today.

## 2013-11-25 NOTE — Telephone Encounter (Signed)
What labs and dx?  

## 2013-11-25 NOTE — Assessment & Plan Note (Signed)
LDL and triglycerides have been at goal on current medications. She has no side effects and  Repeat labs are due .  No changes today  Lab Results  Component Value Date   CHOL 177 03/30/2013   HDL 56.40 03/30/2013   LDLCALC 93 03/30/2013   TRIG 140.0 03/30/2013   CHOLHDL 3 03/30/2013

## 2013-11-25 NOTE — Telephone Encounter (Signed)
What dx for the cmet only right?

## 2013-11-25 NOTE — Assessment & Plan Note (Signed)
Discussed patient's concern about recent AARP article warning of increased risk of CAD associated with daily PPI use in patients over 60.  Recommended suspending medication if reflux symptoms have resolved.

## 2013-11-25 NOTE — Progress Notes (Signed)
Pre-visit discussion using our clinic review tool. No additional management support is needed unless otherwise documented below in the visit note.  

## 2013-11-26 LAB — COMPREHENSIVE METABOLIC PANEL
ALT: 18 U/L (ref 0–35)
AST: 19 U/L (ref 0–37)
Albumin: 4.5 g/dL (ref 3.5–5.2)
Alkaline Phosphatase: 66 U/L (ref 39–117)
BUN: 26 mg/dL — ABNORMAL HIGH (ref 6–23)
CALCIUM: 9.7 mg/dL (ref 8.4–10.5)
CO2: 23 meq/L (ref 19–32)
CREATININE: 1.1 mg/dL (ref 0.4–1.2)
Chloride: 106 mEq/L (ref 96–112)
GFR: 54.82 mL/min — AB (ref >60.00)
GLUCOSE: 87 mg/dL (ref 70–99)
Potassium: 4.5 mEq/L (ref 3.5–5.1)
Sodium: 136 mEq/L (ref 135–145)
Total Bilirubin: 0.5 mg/dL (ref 0.2–1.2)
Total Protein: 7.7 g/dL (ref 6.0–8.3)

## 2013-11-26 NOTE — Addendum Note (Signed)
Addended by: Karlene Einstein D on: 11/26/2013 08:35 AM   Modules accepted: Orders

## 2013-11-27 ENCOUNTER — Encounter: Payer: Self-pay | Admitting: *Deleted

## 2013-12-03 ENCOUNTER — Encounter: Payer: Self-pay | Admitting: Internal Medicine

## 2014-02-01 ENCOUNTER — Other Ambulatory Visit: Payer: Self-pay | Admitting: Internal Medicine

## 2014-02-08 ENCOUNTER — Ambulatory Visit (INDEPENDENT_AMBULATORY_CARE_PROVIDER_SITE_OTHER): Payer: Self-pay | Admitting: Internal Medicine

## 2014-02-08 ENCOUNTER — Encounter: Payer: Self-pay | Admitting: Internal Medicine

## 2014-02-08 VITALS — BP 118/80 | HR 71 | Temp 98.0°F | Ht 66.5 in | Wt 196.5 lb

## 2014-02-08 DIAGNOSIS — J0111 Acute recurrent frontal sinusitis: Secondary | ICD-10-CM

## 2014-02-08 MED ORDER — LEVOFLOXACIN 500 MG PO TABS: 500.0000 mg | ORAL_TABLET | Freq: Every day | ORAL | Status: DC

## 2014-02-08 MED ORDER — GUAIFENESIN-CODEINE 100-10 MG/5ML PO SYRP
5.0000 mL | ORAL_SOLUTION | Freq: Three times a day (TID) | ORAL | Status: DC | PRN
Start: 2014-02-08 — End: 2014-02-08

## 2014-02-08 MED ORDER — PREDNISONE (PAK) 10 MG PO TABS: ORAL_TABLET | ORAL | Status: DC

## 2014-02-08 MED ORDER — GUAIFENESIN-CODEINE 100-10 MG/5ML PO SYRP: 5.0000 mL | ORAL_SOLUTION | Freq: Three times a day (TID) | ORAL | Status: DC | PRN

## 2014-02-08 NOTE — Progress Notes (Signed)
Pre visit review using our clinic review tool, if applicable. No additional management support is needed unless otherwise documented below in the visit note. 

## 2014-02-08 NOTE — Patient Instructions (Signed)
I am treating you for sinusitis  which is ca complication from your viral infection due to  persistent sinus congestion.   I am prescribing an antibiotic (levaquin) and a prednisone taper  To manage the infection and the inflammation in your ear/sinuses.   I also advise use of the following OTC meds to help with your other symptoms.   Afrin nasal spray two times daily for 5 days  For sinus congestion .  flush your sinuses twice daily with Simply Saline (do over the sink because if you do it right you will spit out globs of mucus)  Use the  cheratussin  liquid cough syrup at night fr severe cough or headache.  It has codeine it so DO NOT SHARE WITH OTHERS  Please take a probiotic ( Align, Floraque or Culturelle) while you are on the antibiotic to prevent a serious antibiotic associated diarrhea  Called clostridium dificile colitis and a vaginal yeast infection

## 2014-02-08 NOTE — Progress Notes (Signed)
Patient ID: Jillian [name], female   DOB: Jul 05, 1950, 63 y.o.   MRN: [id number] Patient Active Problem List   Diagnosis Date Noted  . Sinusitis, acute frontal 02/09/2014  . Insomnia 03/30/2013  . Lumbago 03/30/2013  . Routine general medical examination at a health care facility 03/24/2012  . Venous insufficiency of leg 10/23/2011  . Overweight 06/18/2011  . Shoulder pain, right 03/15/2011  . Screening for breast cancer 12/14/2010  . Screening for cervical cancer 12/14/2010  . GERD (gastroesophageal reflux disease) 12/14/2010  . Arthritis   . Hyperlipidemia   . Hypertension   . Allergy     Subjective:  CC:   Chief Complaint  Patient presents with  . Sinusitis    started with a virus-st, cough, & head congestion 3 weeks ago. C/O facial pressure, headache, scratchy throat, blowing out yellow since    HPI:   Jillian [name] is a 63 y.o. female who presents for 3 week history of URi symptoms .  Started with sore throat,  Rhinitis and cough.  No fevers or chills or body aches,  Rhinitis has improved but persistent sinus congestion and persistent cough for 2-3 weeks,  Disrupting sleep.  Feels lousy.. Facial pain.      Past Medical History  Diagnosis Date  . Arthritis     right shoulder,  no intervention  . Hypertension   . Irritable bowel syndrome (IBS) Oct 2011    last colonoscopy showed diverticulum, no polyps  . Hyperlipidemia   . Allergy     seasonal rhinitis  . GERD (gastroesophageal reflux disease)     Past Surgical History  Procedure Laterality Date  . Tubal ligation  1977  . Tonsillectomy and adenoidectomy  1956       The following portions of the patient's history were reviewed and updated as appropriate: Allergies, current medications, and problem list.    Review of Systems:   Patient denies headache, fevers, malaise, unintentional weight loss, skin rash, eye pain, sinus congestion and sinus pain, sore throat, dysphagia,  hemoptysis , cough,  dyspnea, wheezing, chest pain, palpitations, orthopnea, edema, abdominal pain, nausea, melena, diarrhea, constipation, flank pain, dysuria, hematuria, urinary  Frequency, nocturia, numbness, tingling, seizures,  Focal weakness, Loss of consciousness,  Tremor, insomnia, depression, anxiety, and suicidal ideation.     History   Social History  . Marital Status: Divorced    Spouse Name: N/A    Number of Children: N/A  . Years of Education: N/A   Occupational History  . cutsodial     works at [employer]  . Smoking status: Former Smoker    Types: Cigarettes    Quit date: 03/16/1999  . Smokeless tobacco: Never Used  . Alcohol Use: No  . Drug Use: No  . Sexual Activity: Not Currently   Other Topics Concern  . Not on file   Social History Narrative    Objective:  Filed Vitals:   02/08/14 1132  BP: 118/80  Pulse: 71  Temp: 98 F (36.7 C)     General appearance: alert, cooperative and appears stated age Ears: normal TM's and external ear canals both ears Throat: lips, mucosa, and tongue normal; teeth and gums normal Neck: no adenopathy, no carotid bruit, supple, symmetrical, trachea midline and thyroid not enlarged, symmetric, no tenderness/mass/nodules Back: symmetric, no curvature. ROM normal. No CVA tenderness. Lungs: clear to auscultation bilaterally Heart: regular rate and rhythm, S1, S2 normal, no murmur, click, rub or gallop Abdomen: soft,  non-tender; bowel sounds normal; no masses,  no organomegaly Pulses: 2+ and symmetric Skin: Skin color, texture, turgor normal. No rashes or lesions Lymph nodes: Cervical, supraclavicular, and axillary nodes normal.  Assessment and Plan:  Sinusitis, acute frontal Given chronicity of symptoms, development of facial pain and exam consistent with bacterial URI,  Will treat with empiric antibiotics, decongestants, and saline lavage.Patient was initially prescribed levaquin but developed bilateral LE numbness  after first dose.  Change in therapy to augmentin,  Advised to return medication  to pharmacist as this is the second report of bilateral LE numbness reported in the last month to me by patients  taking levaquin.    Updated Medication List Outpatient Encounter Prescriptions as of 02/08/2014  Medication Sig  . ALPRAZolam (XANAX) 0.25 MG tablet Take 1 tablet (0.25 mg total) by mouth at bedtime as needed for sleep.  Marland Kitchen amLODipine (NORVASC) 2.5 MG tablet Take 1 tablet (2.5 mg total) by mouth daily.  Marland Kitchen aspirin 81 MG tablet Take 81 mg by mouth daily.    . Cholecalciferol (VITAMIN D3) 1000 UNITS CAPS Take 1 capsule by mouth daily.    . enalapril (VASOTEC) 20 MG tablet TAKE 1 TABLET TWICE DAILY  . fluticasone (FLONASE) 50 MCG/ACT nasal spray Place 2 sprays into the nose daily.  Marland Kitchen ibuprofen (ADVIL,MOTRIN) 200 MG tablet Take 400 mg by mouth every 6 (six) hours as needed.    . loratadine (CLARITIN) 10 MG tablet Take 10 mg by mouth daily.  . magnesium oxide (MAG-OX) 400 MG tablet Take 400 mg by mouth daily.    Marland Kitchen omeprazole (PRILOSEC) 20 MG capsule Take 1 capsule (20 mg total) by mouth daily as needed.  . simvastatin (ZOCOR) 40 MG tablet Take 1 tablet (40 mg total) by mouth at bedtime.  Marland Kitchen guaiFENesin-codeine (CHERATUSSIN AC) 100-10 MG/5ML syrup Take 5 mLs by mouth 3 (three) times daily as needed for cough.  Marland Kitchen levofloxacin (LEVAQUIN) 500 MG tablet Take 1 tablet (500 mg total) by mouth daily.  . predniSONE (STERAPRED UNI-PAK) 10 MG tablet 6 tablets on Day 1 , then reduce by 1 tablet daily until gone  . [DISCONTINUED] guaiFENesin-codeine (CHERATUSSIN AC) 100-10 MG/5ML syrup Take 5 mLs by mouth 3 (three) times daily as needed for cough.     No orders of the defined types were placed in this encounter.    No Follow-up on file.

## 2014-02-09 ENCOUNTER — Telehealth: Payer: Self-pay | Admitting: Internal Medicine

## 2014-02-09 DIAGNOSIS — J0110 Acute frontal sinusitis, unspecified: Secondary | ICD-10-CM | POA: Insufficient documentation

## 2014-02-09 MED ORDER — AMOXICILLIN-POT CLAVULANATE 875-125 MG PO TABS: 1.0000 | ORAL_TABLET | Freq: Two times a day (BID) | ORAL | Status: DC

## 2014-02-09 NOTE — Telephone Encounter (Signed)
Patient called in and stated about three hours  Of taking Levaquin she started having numbness in both feet and legs started aching, patient also stated she felt shaky all over. Advised patient not to take anymore until could reach MD patient stated she would not.  Please advise.

## 2014-02-09 NOTE — Assessment & Plan Note (Signed)
Given chronicity of symptoms, development of facial pain and exam consistent with bacterial URI,  Will treat with empiric antibiotics, decongestants, and saline lavage.Patient was initially prescribed levaquin but developed bilateral LE numbness after first dose.  Change in therapy to augmentin,  Advised to return medication  to pharmacist as this is the second report of bilateral LE numbness reported in the last month to me by patients  taking levaquin.

## 2014-02-09 NOTE — Telephone Encounter (Signed)
Stop the levaquin  This is the 2nd patient of mine in the last month who has had the same reaction to levaquin, and is cause for concern.  She needs to report it to her pharmacist  bc it may be tainted.  Please have her tell her pharmacist and bring the levaquin back to him   I will call in augmentin to take instead . Start it tomorrow

## 2014-02-09 NOTE — Telephone Encounter (Signed)
Patient notified and voiced understanding patient stated she would report reaction to pharmacy and that the pharmacist had mentioned they were having a problem with this medication.

## 2014-06-15 ENCOUNTER — Other Ambulatory Visit: Payer: Self-pay | Admitting: Internal Medicine

## 2014-06-27 ENCOUNTER — Other Ambulatory Visit: Payer: Self-pay | Admitting: Internal Medicine

## 2014-08-02 ENCOUNTER — Ambulatory Visit (INDEPENDENT_AMBULATORY_CARE_PROVIDER_SITE_OTHER): Payer: Medicare Other | Admitting: Internal Medicine

## 2014-08-02 VITALS — BP 122/78 | HR 68 | Temp 98.5°F | Resp 16 | Ht 67.0 in | Wt 196.1 lb

## 2014-08-02 DIAGNOSIS — K219 Gastro-esophageal reflux disease without esophagitis: Secondary | ICD-10-CM

## 2014-08-02 DIAGNOSIS — I10 Essential (primary) hypertension: Secondary | ICD-10-CM

## 2014-08-02 DIAGNOSIS — E663 Overweight: Secondary | ICD-10-CM | POA: Diagnosis not present

## 2014-08-02 DIAGNOSIS — Z1239 Encounter for other screening for malignant neoplasm of breast: Secondary | ICD-10-CM

## 2014-08-02 DIAGNOSIS — E785 Hyperlipidemia, unspecified: Secondary | ICD-10-CM | POA: Diagnosis not present

## 2014-08-02 LAB — COMPREHENSIVE METABOLIC PANEL
ALK PHOS: 65 U/L (ref 39–117)
ALT: 17 U/L (ref 0–35)
AST: 22 U/L (ref 0–37)
Albumin: 4.4 g/dL (ref 3.5–5.2)
BUN: 20 mg/dL (ref 6–23)
CO2: 26 meq/L (ref 19–32)
Calcium: 9.7 mg/dL (ref 8.4–10.5)
Chloride: 106 mEq/L (ref 96–112)
Creatinine, Ser: 1 mg/dL (ref 0.40–1.20)
GFR: 59.14 mL/min — ABNORMAL LOW (ref >60.00)
GLUCOSE: 80 mg/dL (ref 70–99)
POTASSIUM: 4.7 meq/L (ref 3.5–5.1)
Sodium: 138 mEq/L (ref 135–145)
Total Bilirubin: 0.4 mg/dL (ref 0.2–1.2)
Total Protein: 6.9 g/dL (ref 6.0–8.3)

## 2014-08-02 LAB — LIPID PANEL
CHOLESTEROL: 206 mg/dL — AB (ref 0–200)
HDL: 57.6 mg/dL (ref >39.00)
NONHDL: 148.4
Total CHOL/HDL Ratio: 4
Triglycerides: 204 mg/dL — ABNORMAL HIGH (ref 0.0–149.0)
VLDL: 40.8 mg/dL — ABNORMAL HIGH (ref 0.0–40.0)

## 2014-08-02 LAB — LDL CHOLESTEROL, DIRECT: LDL DIRECT: 106 mg/dL

## 2014-08-02 MED ORDER — ZOSTER VACCINE LIVE 19400 UNT/0.65ML ~~LOC~~ SOLR: 0.6500 mL | Freq: Once | SUBCUTANEOUS | Status: DC

## 2014-08-02 NOTE — Progress Notes (Signed)
Subjective:  Patient ID: Jillian [name], female    DOB: 1950-10-30  Age: 64 y.o. MRN: [id number]  CC: The primary encounter diagnosis was Screening for breast cancer. Diagnoses of Essential hypertension, Hyperlipidemia, Overweight, and Gastroesophageal reflux disease without esophagitis were also pertinent to this visit.  HPI Jillian [name] presents for follow up on hypertension, hyperlipidemia, arthritis and overweight.    She is frustrated about trying to lose weight because she states that she although she walked a mile every day for a month and did not lose any lbs.  She remains physically active,  But is hindered by foot pain due to chronic  plantar fasciitis and neuropathy.  She is planning to start exercising using the water aerobics plan at the [facility].    Last seen in December  Outpatient Prescriptions Prior to Visit  Medication Sig Dispense Refill  . ALPRAZolam (XANAX) 0.25 MG tablet Take 1 tablet (0.25 mg total) by mouth at bedtime as needed for sleep. 30 tablet 2  . amLODipine (NORVASC) 2.5 MG tablet TAKE 1 TABLET DAILY 90 tablet 1  . aspirin 81 MG tablet Take 81 mg by mouth daily.      . Cholecalciferol (VITAMIN D3) 1000 UNITS CAPS Take 1 capsule by mouth daily.      . enalapril (VASOTEC) 20 MG tablet TAKE 1 TABLET TWICE DAILY 180 tablet 1  . fluticasone (FLONASE) 50 MCG/ACT nasal spray Place 2 sprays into the nose daily. 16 g 5  . ibuprofen (ADVIL,MOTRIN) 200 MG tablet Take 400 mg by mouth every 6 (six) hours as needed.      . loratadine (CLARITIN) 10 MG tablet Take 10 mg by mouth daily.    . magnesium oxide (MAG-OX) 400 MG tablet Take 400 mg by mouth daily.      Marland Kitchen omeprazole (PRILOSEC) 20 MG capsule Take 1 capsule (20 mg total) by mouth daily as needed. 90 capsule 3  . simvastatin (ZOCOR) 40 MG tablet TAKE 1 TABLET EVERY NIGHT AT BEDTIME 90 tablet 1  . levofloxacin (LEVAQUIN) 500 MG tablet Take 1 tablet (500 mg total) by mouth daily. 7 tablet 0  .  amoxicillin-clavulanate (AUGMENTIN) 875-125 MG per tablet Take 1 tablet by mouth 2 (two) times daily. (Patient not taking: Reported on 08/02/2014) 14 tablet 0  . guaiFENesin-codeine (CHERATUSSIN AC) 100-10 MG/5ML syrup Take 5 mLs by mouth 3 (three) times daily as needed for cough. (Patient not taking: Reported on 08/02/2014) 120 mL 0  . predniSONE (STERAPRED UNI-PAK) 10 MG tablet 6 tablets on Day 1 , then reduce by 1 tablet daily until gone (Patient not taking: Reported on 08/02/2014) 21 tablet 0   No facility-administered medications prior to visit.    Review of Systems;  Patient denies headache, fevers, malaise, unintentional weight loss, skin rash, eye pain, sinus congestion and sinus pain, sore throat, dysphagia,  hemoptysis , cough, dyspnea, wheezing, chest pain, palpitations, orthopnea, edema, abdominal pain, nausea, melena, diarrhea, constipation, flank pain, dysuria, hematuria, urinary  Frequency, nocturia, numbness, tingling, seizures,  Focal weakness, Loss of consciousness,  Tremor, insomnia, depression, anxiety, and suicidal ideation.      Objective:  BP 122/78 mmHg  Pulse 68  Temp(Src) 98.5 F (36.9 C)  Resp 16  Ht 5\' 7"  (1.702 m)  Wt 196 lb 1.9 oz (88.959 kg)  BMI 30.71 kg/m2  SpO2 97%  BP Readings from Last 3 Encounters:  08/02/14 122/78  02/08/14 118/80  11/25/13 116/78    Wt Readings from Last 3 Encounters:  08/02/14 196 lb 1.9 oz (88.959 kg)  02/08/14 196 lb 8 oz (89.132 kg)  11/25/13 194 lb (87.998 kg)    General appearance: alert, cooperative and appears stated age Ears: normal TM's and external ear canals both ears Throat: lips, mucosa, and tongue normal; teeth and gums normal Neck: no adenopathy, no carotid bruit, supple, symmetrical, trachea midline and thyroid not enlarged, symmetric, no tenderness/mass/nodules Back: symmetric, no curvature. ROM normal. No CVA tenderness. Lungs: clear to auscultation bilaterally Heart: regular rate and rhythm, S1, S2 normal,  no murmur, click, rub or gallop Abdomen: soft, non-tender; bowel sounds normal; no masses,  no organomegaly Pulses: 2+ and symmetric Skin: Skin color, texture, turgor normal. No rashes or lesions Lymph nodes: Cervical, supraclavicular, and axillary nodes normal.   Assessment & Plan:   Problem List Items Addressed This Visit    Hyperlipidemia    LDLis  at goal on current medications. She has no side effects and  Liver enzymes are normal.  No changes today  Lab Results  Component Value Date   CHOL 206* 08/02/2014   HDL 57.60 08/02/2014   LDLCALC 93 03/30/2013   LDLDIRECT 106.0 08/02/2014   TRIG 204.0* 08/02/2014   CHOLHDL 4 08/02/2014           Relevant Orders   Lipid panel (Completed)   Hypertension    Well controlled on current regimen. , no changes today.      Relevant Orders   Comprehensive metabolic panel (Completed)   GERD (gastroesophageal reflux disease)    At last visit we discussed patient's concern about recent AARP article warning of increased risk of CAD associated with daily PPI use in patients over 60.  Recommended trial of using H2 blockers for management of symptoms.       Overweight    I have congratulated her in starting an exercise program and encouraged  Her to lose  10% of body weight over the next 6 months using a low glycemic index diet and regular exercise a minimum of 5 days per week.        Screening for breast cancer - Primary   Relevant Orders   MM DIGITAL SCREENING BILATERAL     A total of 25 minutes of face to face time was spent with patient more than half of which was spent in counselling about the above mentioned conditions  and coordination of care   I have discontinued [name]'s levofloxacin, predniSONE, guaiFENesin-codeine, and amoxicillin-clavulanate. I am also having her start on zoster vaccine live (PF). Additionally, I am having her maintain her Vitamin D3, magnesium oxide, ibuprofen, aspirin, omeprazole, loratadine,  ALPRAZolam, fluticasone, enalapril, simvastatin, and amLODipine.  Meds ordered this encounter  Medications  . zoster vaccine live, PF, (ZOSTAVAX) 19379 UNT/0.65ML injection    Sig: Inject 19,400 Units into the skin once.    Dispense:  1 each    Refill:  0    Medications Discontinued During This Encounter  Medication Reason  . amoxicillin-clavulanate (AUGMENTIN) 875-125 MG per tablet   . predniSONE (STERAPRED UNI-PAK) 10 MG tablet   . guaiFENesin-codeine (CHERATUSSIN AC) 100-10 MG/5ML syrup   . levofloxacin (LEVAQUIN) 500 MG tablet     Follow-up: No Follow-up on file.   Crecencio Mc, MD

## 2014-08-02 NOTE — Patient Instructions (Signed)
You are doing well,  No  changes were made today  The pertussis vaccine (whooping cough) is not available as a single uncombined vaccine in the [place]  Your mammogram has been ordered  Return in October for your annual exam

## 2014-08-02 NOTE — Progress Notes (Signed)
Pre visit review using our clinic review tool, if applicable. No additional management support is needed unless otherwise documented below in the visit note. 

## 2014-08-03 ENCOUNTER — Encounter: Payer: Self-pay | Admitting: Internal Medicine

## 2014-08-03 ENCOUNTER — Other Ambulatory Visit: Payer: Self-pay | Admitting: Internal Medicine

## 2014-08-03 NOTE — Assessment & Plan Note (Signed)
Well controlled on current regimen, no changes today. 

## 2014-08-03 NOTE — Assessment & Plan Note (Signed)
LDLis  at goal on current medications. She has no side effects and  Liver enzymes are normal.  No changes today  Lab Results  Component Value Date   CHOL 206* 08/02/2014   HDL 57.60 08/02/2014   LDLCALC 93 03/30/2013   LDLDIRECT 106.0 08/02/2014   TRIG 204.0* 08/02/2014   CHOLHDL 4 08/02/2014

## 2014-08-03 NOTE — Assessment & Plan Note (Signed)
At last visit we discussed patient's concern about recent AARP article warning of increased risk of CAD associated with daily PPI use in patients over 60.  Recommended trial of using H2 blockers for management of symptoms.

## 2014-08-03 NOTE — Assessment & Plan Note (Signed)
I have congratulated her in starting an exercise program and encouraged  Her to lose  10% of body weight over the next 6 months using a low glycemic index diet and regular exercise a minimum of 5 days per week.

## 2014-08-06 NOTE — Telephone Encounter (Signed)
Left message for pt to return my call. Also sent mychart on need for appt

## 2014-08-06 NOTE — Telephone Encounter (Signed)
Spoke to pt, all appts have been taken for today. Recommended she be seen in urgent care,  verbalized understanding

## 2014-08-25 ENCOUNTER — Ambulatory Visit: Payer: Medicare Other | Admitting: Internal Medicine

## 2014-08-26 ENCOUNTER — Ambulatory Visit: Payer: Medicare Other | Admitting: Internal Medicine

## 2014-09-07 DIAGNOSIS — Z1283 Encounter for screening for malignant neoplasm of skin: Secondary | ICD-10-CM | POA: Diagnosis not present

## 2014-09-07 DIAGNOSIS — L821 Other seborrheic keratosis: Secondary | ICD-10-CM | POA: Diagnosis not present

## 2014-09-07 DIAGNOSIS — Z08 Encounter for follow-up examination after completed treatment for malignant neoplasm: Secondary | ICD-10-CM | POA: Diagnosis not present

## 2014-09-07 DIAGNOSIS — Z85828 Personal history of other malignant neoplasm of skin: Secondary | ICD-10-CM | POA: Diagnosis not present

## 2014-09-07 DIAGNOSIS — L218 Other seborrheic dermatitis: Secondary | ICD-10-CM | POA: Diagnosis not present

## 2014-09-07 DIAGNOSIS — B078 Other viral warts: Secondary | ICD-10-CM | POA: Diagnosis not present

## 2014-09-30 DIAGNOSIS — Z789 Other specified health status: Secondary | ICD-10-CM | POA: Diagnosis not present

## 2014-09-30 DIAGNOSIS — L821 Other seborrheic keratosis: Secondary | ICD-10-CM | POA: Diagnosis not present

## 2014-09-30 DIAGNOSIS — B078 Other viral warts: Secondary | ICD-10-CM | POA: Diagnosis not present

## 2014-11-19 ENCOUNTER — Encounter: Payer: Self-pay | Admitting: Internal Medicine

## 2014-11-19 DIAGNOSIS — Z13820 Encounter for screening for osteoporosis: Secondary | ICD-10-CM

## 2014-11-22 LAB — HM MAMMOGRAPHY: HM MAMMO: NORMAL

## 2014-11-23 ENCOUNTER — Ambulatory Visit
Admission: RE | Admit: 2014-11-23 | Discharge: 2014-11-23 | Disposition: A | Discharge status: Home / Self Care | Payer: Medicare Other | Source: Ambulatory Visit | Attending: Internal Medicine | Admitting: Internal Medicine

## 2014-11-23 DIAGNOSIS — M8580 Other specified disorders of bone density and structure, unspecified site: Secondary | ICD-10-CM | POA: Insufficient documentation

## 2014-11-23 DIAGNOSIS — Z13820 Encounter for screening for osteoporosis: Secondary | ICD-10-CM | POA: Diagnosis not present

## 2014-11-23 DIAGNOSIS — M85851 Other specified disorders of bone density and structure, right thigh: Secondary | ICD-10-CM | POA: Diagnosis not present

## 2014-11-23 DIAGNOSIS — Z1231 Encounter for screening mammogram for malignant neoplasm of breast: Secondary | ICD-10-CM | POA: Diagnosis not present

## 2014-11-24 ENCOUNTER — Ambulatory Visit
Admission: RE | Admit: 2014-11-24 | Discharge: 2014-11-24 | Disposition: A | Discharge status: Home / Self Care | Payer: Medicare Other | Source: Ambulatory Visit | Attending: Internal Medicine | Admitting: Internal Medicine

## 2014-11-24 ENCOUNTER — Encounter: Payer: Self-pay | Admitting: Internal Medicine

## 2014-11-24 ENCOUNTER — Other Ambulatory Visit: Payer: Self-pay | Admitting: Internal Medicine

## 2014-11-24 DIAGNOSIS — M8580 Other specified disorders of bone density and structure, unspecified site: Secondary | ICD-10-CM | POA: Diagnosis not present

## 2014-11-24 DIAGNOSIS — Z1239 Encounter for other screening for malignant neoplasm of breast: Secondary | ICD-10-CM

## 2014-11-24 DIAGNOSIS — Z13820 Encounter for screening for osteoporosis: Secondary | ICD-10-CM | POA: Diagnosis not present

## 2014-11-24 DIAGNOSIS — Z1231 Encounter for screening mammogram for malignant neoplasm of breast: Secondary | ICD-10-CM | POA: Diagnosis not present

## 2014-11-24 HISTORY — DX: Malignant (primary) neoplasm, unspecified: C80.1

## 2014-11-24 LAB — HM DEXA SCAN: HM DEXA SCAN: NORMAL

## 2014-11-28 ENCOUNTER — Encounter: Payer: Self-pay | Admitting: Internal Medicine

## 2014-11-29 ENCOUNTER — Encounter: Payer: Self-pay | Admitting: Internal Medicine

## 2014-11-29 ENCOUNTER — Ambulatory Visit (INDEPENDENT_AMBULATORY_CARE_PROVIDER_SITE_OTHER): Payer: Medicare Other | Admitting: Internal Medicine

## 2014-11-29 VITALS — BP 126/78 | HR 70 | Temp 98.3°F | Resp 12 | Ht 66.0 in | Wt 200.5 lb

## 2014-11-29 DIAGNOSIS — Z23 Encounter for immunization: Secondary | ICD-10-CM

## 2014-11-29 DIAGNOSIS — R5383 Other fatigue: Secondary | ICD-10-CM | POA: Diagnosis not present

## 2014-11-29 DIAGNOSIS — E785 Hyperlipidemia, unspecified: Secondary | ICD-10-CM | POA: Diagnosis not present

## 2014-11-29 DIAGNOSIS — Z113 Encounter for screening for infections with a predominantly sexual mode of transmission: Secondary | ICD-10-CM

## 2014-11-29 DIAGNOSIS — I10 Essential (primary) hypertension: Secondary | ICD-10-CM

## 2014-11-29 DIAGNOSIS — Z0000 Encounter for general adult medical examination without abnormal findings: Secondary | ICD-10-CM | POA: Diagnosis not present

## 2014-11-29 DIAGNOSIS — Z1239 Encounter for other screening for malignant neoplasm of breast: Secondary | ICD-10-CM

## 2014-11-29 DIAGNOSIS — E669 Obesity, unspecified: Secondary | ICD-10-CM

## 2014-11-29 DIAGNOSIS — E559 Vitamin D deficiency, unspecified: Secondary | ICD-10-CM

## 2014-11-29 DIAGNOSIS — Z124 Encounter for screening for malignant neoplasm of cervix: Secondary | ICD-10-CM

## 2014-11-29 LAB — CBC WITH DIFFERENTIAL/PLATELET
BASOS ABS: 0 10*3/uL (ref 0.0–0.1)
BASOS PCT: 0.3 % (ref 0.0–3.0)
EOS ABS: 0.1 10*3/uL (ref 0.0–0.7)
Eosinophils Relative: 1.2 % (ref 0.0–5.0)
HEMATOCRIT: 39.4 % (ref 36.0–46.0)
Hemoglobin: 13 g/dL (ref 12.0–15.0)
LYMPHS ABS: 2.7 10*3/uL (ref 0.7–4.0)
Lymphocytes Relative: 29.7 % (ref 12.0–46.0)
MCHC: 33 g/dL (ref 30.0–36.0)
MCV: 89.6 fl (ref 78.0–100.0)
MONO ABS: 0.6 10*3/uL (ref 0.1–1.0)
Monocytes Relative: 6.8 % (ref 3.0–12.0)
NEUTROS ABS: 5.5 10*3/uL (ref 1.4–7.7)
NEUTROS PCT: 62 % (ref 43.0–77.0)
PLATELETS: 225 10*3/uL (ref 150.0–400.0)
RBC: 4.4 Mil/uL (ref 3.87–5.11)
RDW: 13.8 % (ref 11.5–15.5)
WBC: 8.9 10*3/uL (ref 4.0–10.5)

## 2014-11-29 LAB — COMPREHENSIVE METABOLIC PANEL
ALBUMIN: 4.4 g/dL (ref 3.5–5.2)
ALT: 18 U/L (ref 0–35)
AST: 22 U/L (ref 0–37)
Alkaline Phosphatase: 66 U/L (ref 39–117)
BUN: 19 mg/dL (ref 6–23)
CHLORIDE: 106 meq/L (ref 96–112)
CO2: 25 meq/L (ref 19–32)
CREATININE: 1.07 mg/dL (ref 0.40–1.20)
Calcium: 9.9 mg/dL (ref 8.4–10.5)
GFR: 54.64 mL/min — ABNORMAL LOW (ref >60.00)
GLUCOSE: 89 mg/dL (ref 70–99)
Potassium: 4.9 mEq/L (ref 3.5–5.1)
SODIUM: 142 meq/L (ref 135–145)
Total Bilirubin: 0.4 mg/dL (ref 0.2–1.2)
Total Protein: 7 g/dL (ref 6.0–8.3)

## 2014-11-29 LAB — LIPID PANEL
CHOL/HDL RATIO: 3
CHOLESTEROL: 183 mg/dL (ref 0–200)
HDL: 59.3 mg/dL (ref >39.00)
NonHDL: 123.68
Triglycerides: 231 mg/dL — ABNORMAL HIGH (ref 0.0–149.0)
VLDL: 46.2 mg/dL — ABNORMAL HIGH (ref 0.0–40.0)

## 2014-11-29 LAB — VITAMIN D 25 HYDROXY (VIT D DEFICIENCY, FRACTURES): VITD: 48.3 ng/mL (ref 30.00–100.00)

## 2014-11-29 LAB — HIV ANTIBODY (ROUTINE TESTING W REFLEX): HIV: NONREACTIVE

## 2014-11-29 LAB — TSH: TSH: 0.68 u[IU]/mL (ref 0.35–4.50)

## 2014-11-29 MED ORDER — ZOSTER VACCINE LIVE 19400 UNT/0.65ML ~~LOC~~ SOLR: 0.6500 mL | Freq: Once | SUBCUTANEOUS | Status: DC

## 2014-11-29 NOTE — Patient Instructions (Signed)

## 2014-11-29 NOTE — Progress Notes (Signed)
Pre-visit discussion using our clinic review tool. No additional management support is needed unless otherwise documented below in the visit note.  

## 2014-11-30 LAB — LDL CHOLESTEROL, DIRECT: LDL DIRECT: 104 mg/dL

## 2014-11-30 LAB — HEPATITIS C ANTIBODY: HCV Ab: NEGATIVE

## 2014-11-30 NOTE — Assessment & Plan Note (Signed)
Normal in 2014.  Will repeat one last time in 2017

## 2014-11-30 NOTE — Assessment & Plan Note (Signed)
LDL has been at goal on current medications. She has no side effects , repeat levels are pendingl.  No changes today  Lab Results  Component Value Date   CHOL 206* 08/02/2014   HDL 57.60 08/02/2014   LDLCALC 93 03/30/2013   LDLDIRECT 106.0 08/02/2014   TRIG 204.0* 08/02/2014   CHOLHDL 4 08/02/2014

## 2014-11-30 NOTE — Assessment & Plan Note (Signed)
Continue annual breast exams and mammograms at [facility].

## 2014-11-30 NOTE — Assessment & Plan Note (Signed)
Welcome to Medicare wellness  exam was done as well as a comprehensive physical exam .  During the course of the visit the patient was educated and counseled about appropriate screening and preventive services including : fall prevention , diabetes screening, nutrition counseling, colorectal cancer screening, and recommended immunizations.  Printed recommendations for health maintenance screenings was given.

## 2014-11-30 NOTE — Progress Notes (Signed)
Patient ID: Jillian [name], female    DOB: Jan 11, 1951  Age: 64 y.o. MRN: [id number]  The patient is here for her welcome to  Medicare wellness examination and management of other chronic and acute problems.   The risk factors are reflected in the social history.  The roster of all physicians providing medical care to patient - is listed in the Snapshot section of the chart.  Activities of daily living:  The patient is 100% independent in all ADLs: dressing, toileting, feeding as well as independent mobility  Home safety : The patient has smoke detectors in the home. They wear seatbelts.  There are no firearms at home. There is no violence in the home.   There is no risks for hepatitis, STDs or HIV. There is no   history of blood transfusion. They have no travel history to infectious disease endemic areas of the world.  The patient has seen their dentist in the last six month. They have seen their eye doctor in the last year. They admit to slight hearing difficulty with regard to whispered voices and some television programs.  They have deferred audiologic testing in the last year.  They do not  have excessive sun exposure. Discussed the need for sun protection: hats, long sleeves and use of sunscreen if there is significant sun exposure.   Diet: the importance of a healthy diet is discussed. They do have a healthy diet.  The benefits of regular aerobic exercise were discussed. She is not exercising regularly..   Depression screen: there are no signs or vegative symptoms of depression- irritability, change in appetite, anhedonia, sadness/tearfullness.  Cognitive assessment: the patient manages all their financial and personal affairs and is actively engaged. They could relate day,date,year and events; recalled 2/3 objects at 3 minutes; performed clock-face test normally.  The following portions of the patient's history were reviewed and updated as appropriate: allergies, current  medications, past family history, past medical history,  past surgical history, past social history  and problem list.  Visual acuity was not assessed per patient preference since she has regular follow up with her ophthalmologist. Hearing and body mass index were assessed and reviewed.   During the course of the visit the patient was educated and counseled about appropriate screening and preventive services including : fall prevention , diabetes screening, nutrition counseling, colorectal cancer screening, and recommended immunizations.    CC: The primary encounter diagnosis was Other fatigue. Diagnoses of Screen for STD (sexually transmitted disease), Hyperlipidemia, Vitamin D deficiency, Encounter for immunization, Need for prophylactic vaccination against Streptococcus pneumoniae (pneumococcus), Screening for cervical cancer, Screening for breast cancer, Initial Medicare annual wellness visit, Obesity, and Essential hypertension were also pertinent to this visit.  History Krisalyn has a past medical history of Arthritis; Hypertension; Irritable bowel syndrome (IBS) (Oct 2011); Hyperlipidemia; Allergy; GERD (gastroesophageal reflux disease); and Cancer ([place]).   She has past surgical history that includes Tubal ligation (1977) and Tonsillectomy and adenoidectomy (1956).   Her family history includes Cancer in her cousin; Hyperlipidemia in her father, maternal grandfather, maternal grandmother, and mother; Hypertension in her father.She reports that she quit smoking about 15 years ago. Her smoking use included Cigarettes. She has never used smokeless tobacco. She reports that she does not drink alcohol or use illicit drugs.  Outpatient Prescriptions Prior to Visit  Medication Sig Dispense Refill  . ALPRAZolam (XANAX) 0.25 MG tablet Take 1 tablet (0.25 mg total) by mouth at bedtime as needed for sleep. 30 tablet 2  .  amLODipine (NORVASC) 2.5 MG tablet TAKE 1 TABLET DAILY 90 tablet 1  . aspirin 81  MG tablet Take 81 mg by mouth daily.      . Cholecalciferol (VITAMIN D3) 1000 UNITS CAPS Take 1 capsule by mouth daily.      . enalapril (VASOTEC) 20 MG tablet TAKE 1 TABLET TWICE DAILY 180 tablet 1  . fluticasone (FLONASE) 50 MCG/ACT nasal spray Place 2 sprays into the nose daily. 16 g 5  . ibuprofen (ADVIL,MOTRIN) 200 MG tablet Take 400 mg by mouth every 6 (six) hours as needed.      . loratadine (CLARITIN) 10 MG tablet Take 10 mg by mouth daily.    . magnesium oxide (MAG-OX) 400 MG tablet Take 400 mg by mouth daily.      Marland Kitchen omeprazole (PRILOSEC) 20 MG capsule Take 1 capsule (20 mg total) by mouth daily as needed. 90 capsule 3  . simvastatin (ZOCOR) 40 MG tablet TAKE 1 TABLET EVERY NIGHT AT BEDTIME 90 tablet 1  . zoster vaccine live, PF, (ZOSTAVAX) 36644 UNT/0.65ML injection Inject 19,400 Units into the skin once. (Patient not taking: Reported on 11/29/2014) 1 each 0   No facility-administered medications prior to visit.    Review of Systems   Patient denies headache, fevers, malaise, unintentional weight loss, skin rash, eye pain, sinus congestion and sinus pain, sore throat, dysphagia,  hemoptysis , cough, dyspnea, wheezing, chest pain, palpitations, orthopnea, edema, abdominal pain, nausea, melena, diarrhea, constipation, flank pain, dysuria, hematuria, urinary  Frequency, nocturia, numbness, tingling, seizures,  Focal weakness, Loss of consciousness,  Tremor, insomnia, depression, anxiety, and suicidal ideation.      Objective:  BP 126/78 mmHg  Pulse 70  Temp(Src) 98.3 F (36.8 C) (Oral)  Resp 12  Ht 5\' 6"  (1.676 m)  Wt 200 lb 8 oz (90.946 kg)  BMI 32.38 kg/m2  SpO2 97%  Physical Exam   General appearance: alert, cooperative and appears stated age Head: Normocephalic, without obvious abnormality, atraumatic Eyes: conjunctivae/corneas clear. PERRL, EOM's intact. Fundi benign. Ears: normal TM's and external ear canals both ears Nose: Nares normal. Septum midline. Mucosa normal.  No drainage or sinus tenderness. Throat: lips, mucosa, and tongue normal; teeth and gums normal Neck: no adenopathy, no carotid bruit, no JVD, supple, symmetrical, trachea midline and thyroid not enlarged, symmetric, no tenderness/mass/nodules Lungs: clear to auscultation bilaterally Breasts: normal appearance, no masses or tenderness Heart: regular rate and rhythm, S1, S2 normal, no murmur, click, rub or gallop Abdomen: soft, non-tender; bowel sounds normal; no masses,  no organomegaly Extremities: extremities normal, atraumatic, no cyanosis or edema Pulses: 2+ and symmetric Skin: Skin color, texture, turgor normal. No rashes or lesions Neurologic: Alert and oriented X 3, normal strength and tone. Normal symmetric reflexes. Normal coordination and gait.     Assessment & Plan:   Problem List Items Addressed This Visit    Hyperlipidemia    LDL has been at goal on current medications. She has no side effects , repeat levels are pendingl.  No changes today  Lab Results  Component Value Date   CHOL 206* 08/02/2014   HDL 57.60 08/02/2014   LDLCALC 93 03/30/2013   LDLDIRECT 106.0 08/02/2014   TRIG 204.0* 08/02/2014   CHOLHDL 4 08/02/2014             Relevant Orders   Lipid panel   Hypertension    Well controlled on current regimen. , no changes today.        Screening for breast cancer  Continue annual breast exams and mammograms at [facility].       Screening for cervical cancer    Normal in 2014.  Will repeat one last time in 2017      Obesity    I have reviewed her  Weight gain and current  BMI of 32.  I have recommended a low glycemic index diet utilizing smaller more frequent meals to increase metabolism.  I have also recommended that patient increase her exercising with a goal of 30 minutes of aerobic exercise a minimum of 5 days per week. Screening for  thyroid and diabetes to be done today.        Initial Medicare annual wellness visit    Welcome to  Medicare wellness  exam was done as well as a comprehensive physical exam .  During the course of the visit the patient was educated and counseled about appropriate screening and preventive services including : fall prevention , diabetes screening, nutrition counseling, colorectal cancer screening, and recommended immunizations.  Printed recommendations for health maintenance screenings was given.         Other Visit Diagnoses    Other fatigue    -  Primary    Relevant Medications    zoster vaccine live, PF, (ZOSTAVAX) 10315 UNT/0.65ML injection    Other Relevant Orders    CBC with Differential/Platelet    Comprehensive metabolic panel    TSH    Screen for STD (sexually transmitted disease)        Relevant Orders    HIV antibody (Completed)    Hepatitis C antibody (Completed)    Vitamin D deficiency        Relevant Orders    Vit D  25 hydroxy (rtn osteoporosis monitoring)    Encounter for immunization        Need for prophylactic vaccination against Streptococcus pneumoniae (pneumococcus)        Relevant Orders    Pneumococcal conjugate vaccine 13-valent (Completed)       I am having Ms. Oneil start on zoster vaccine live (PF). I am also having her maintain her Vitamin D3, magnesium oxide, ibuprofen, aspirin, omeprazole, loratadine, ALPRAZolam, fluticasone, simvastatin, amLODipine, zoster vaccine live (PF), enalapril, and Melatonin.  Meds ordered this encounter  Medications  . Melatonin 5 MG CAPS    Sig: Take 1 capsule by mouth at bedtime as needed.  . zoster vaccine live, PF, (ZOSTAVAX) 94585 UNT/0.65ML injection    Sig: Inject 19,400 Units into the skin once.    Dispense:  1 each    Refill:  0    There are no discontinued medications.  Follow-up: Return in about 6 months (around 05/30/2015).   Crecencio Mc, MD

## 2014-11-30 NOTE — Assessment & Plan Note (Addendum)
I have reviewed her  Weight gain and current  BMI of 32.  I have recommended a low glycemic index diet utilizing smaller more frequent meals to increase metabolism.  I have also recommended that patient increase her exercising with a goal of 30 minutes of aerobic exercise a minimum of 5 days per week. Screening for  thyroid and diabetes to be done today.

## 2014-11-30 NOTE — Assessment & Plan Note (Signed)
Well controlled on current regimen, no changes today. 

## 2014-12-02 ENCOUNTER — Encounter: Payer: Self-pay | Admitting: Internal Medicine

## 2014-12-22 ENCOUNTER — Other Ambulatory Visit: Payer: Self-pay | Admitting: Internal Medicine

## 2015-01-17 DIAGNOSIS — H2513 Age-related nuclear cataract, bilateral: Secondary | ICD-10-CM | POA: Diagnosis not present

## 2015-01-31 ENCOUNTER — Other Ambulatory Visit: Payer: Self-pay | Admitting: Internal Medicine

## 2015-02-22 ENCOUNTER — Other Ambulatory Visit: Payer: Self-pay | Admitting: Internal Medicine

## 2015-04-18 ENCOUNTER — Encounter: Payer: Self-pay | Admitting: Internal Medicine

## 2015-04-18 ENCOUNTER — Ambulatory Visit (INDEPENDENT_AMBULATORY_CARE_PROVIDER_SITE_OTHER): Payer: Medicare Other | Admitting: Internal Medicine

## 2015-04-18 VITALS — BP 134/78 | HR 67 | Temp 97.9°F | Resp 12 | Ht 66.25 in | Wt 203.8 lb

## 2015-04-18 DIAGNOSIS — R1030 Lower abdominal pain, unspecified: Secondary | ICD-10-CM | POA: Diagnosis not present

## 2015-04-18 DIAGNOSIS — R1909 Other intra-abdominal and pelvic swelling, mass and lump: Secondary | ICD-10-CM | POA: Diagnosis not present

## 2015-04-18 DIAGNOSIS — R140 Abdominal distension (gaseous): Secondary | ICD-10-CM

## 2015-04-18 DIAGNOSIS — R300 Dysuria: Secondary | ICD-10-CM

## 2015-04-18 LAB — POCT URINALYSIS DIPSTICK
BILIRUBIN UA: NEGATIVE
GLUCOSE UA: NEGATIVE
Ketones, UA: NEGATIVE
NITRITE UA: NEGATIVE
Protein, UA: NEGATIVE
Spec Grav, UA: 1.025
Urobilinogen, UA: 0.2
pH, UA: 5.5

## 2015-04-18 MED ORDER — HYDROCODONE-ACETAMINOPHEN 5-325 MG PO TABS: 1.0000 | ORAL_TABLET | Freq: Four times a day (QID) | ORAL | Status: DC | PRN

## 2015-04-18 NOTE — Patient Instructions (Addendum)
I am prescribing vicodin to use as needed for your pelvic pain  I would like you to get some x rays tomorrow to evaluate your stool burden.  Your urine is slightly abnormal so it is being sent to rule out infection

## 2015-04-18 NOTE — Progress Notes (Signed)
Subjective:  Patient ID: Jillian [name], female    DOB: Jun 29, 1950  Age: 65 y.o. MRN: [id number]  CC: The primary encounter diagnosis was Dysuria. Diagnoses of Bloating symptom and Lower abdominal pain were also pertinent to this visit.  HPI Jillian [name] presents for stomach complaints.   Suprapubic pain started a week ago  Thought it ws IBS flare, but she has had no change in BMs.  Denies  dysuria and frequency.. Some nausea ,  No vomiting. No recent sex. No vaginal discharge.   after 3 days pain started radiating to left hip and back   Pain is constant,  But worse at night when lying down than it is during the day.       Outpatient Prescriptions Prior to Visit  Medication Sig Dispense Refill  . ALPRAZolam (XANAX) 0.25 MG tablet Take 1 tablet (0.25 mg total) by mouth at bedtime as needed for sleep. 30 tablet 2  . amLODipine (NORVASC) 2.5 MG tablet TAKE 1 TABLET DAILY 90 tablet 0  . aspirin 81 MG tablet Take 81 mg by mouth daily.      . Cholecalciferol (VITAMIN D3) 1000 UNITS CAPS Take 1 capsule by mouth daily.      . enalapril (VASOTEC) 20 MG tablet TAKE 1 TABLET TWICE DAILY 180 tablet 0  . fluticasone (FLONASE) 50 MCG/ACT nasal spray Place 2 sprays into the nose daily. 16 g 5  . ibuprofen (ADVIL,MOTRIN) 200 MG tablet Take 400 mg by mouth every 6 (six) hours as needed.      . loratadine (CLARITIN) 10 MG tablet Take 10 mg by mouth daily.    . magnesium oxide (MAG-OX) 400 MG tablet Take 400 mg by mouth daily.      . Melatonin 5 MG CAPS Take 1 capsule by mouth at bedtime as needed.    Marland Kitchen omeprazole (PRILOSEC) 20 MG capsule Take 1 capsule (20 mg total) by mouth daily as needed. 90 capsule 3  . simvastatin (ZOCOR) 40 MG tablet TAKE 1 TABLET EVERY NIGHT AT BEDTIME 90 tablet 0  . simvastatin (ZOCOR) 40 MG tablet TAKE 1 TABLET EVERY NIGHT AT BEDTIME 90 tablet 0  . zoster vaccine live, PF, (ZOSTAVAX) 19166 UNT/0.65ML injection Inject 19,400 Units into the skin once. 1 each 0  .  zoster vaccine live, PF, (ZOSTAVAX) 06004 UNT/0.65ML injection Inject 19,400 Units into the skin once. 1 each 0   No facility-administered medications prior to visit.    Review of Systems;  Patient denies headache, fevers, malaise, unintentional weight loss, skin rash, eye pain, sinus congestion and sinus pain, sore throat, dysphagia,  hemoptysis , cough, dyspnea, wheezing, chest pain, palpitations, orthopnea, edema, abdominal pain, nausea, melena, diarrhea, constipation, flank pain, dysuria, hematuria, urinary  Frequency, nocturia, numbness, tingling, seizures,  Focal weakness, Loss of consciousness,  Tremor, insomnia, depression, anxiety, and suicidal ideation.      Objective:  BP 134/78 mmHg  Pulse 67  Temp(Src) 97.9 F (36.6 C) (Oral)  Resp 12  Ht 5' 6.25" (1.683 m)  Wt 203 lb 12 oz (92.42 kg)  BMI 32.63 kg/m2  SpO2 97%  BP Readings from Last 3 Encounters:  04/18/15 134/78  11/29/14 126/78  08/02/14 122/78    Wt Readings from Last 3 Encounters:  04/18/15 203 lb 12 oz (92.42 kg)  11/29/14 200 lb 8 oz (90.946 kg)  08/02/14 196 lb 1.9 oz (88.959 kg)   General Appearance:    Alert, cooperative, no distress, appears stated age  Head:  Normocephalic, without obvious abnormality, atraumatic  Eyes:    PERRL, conjunctiva/corneas clear, EOM's intact, fundi    benign, both eyes  Ears:    Normal TM's and external ear canals, both ears  Nose:   Nares normal, septum midline, mucosa normal, no drainage    or sinus tenderness  Throat:   Lips, mucosa, and tongue normal; teeth and gums normal  Neck:   Supple, symmetrical, trachea midline, no adenopathy;    thyroid:  no enlargement/tenderness/nodules; no carotid   bruit or JVD  Back:     Symmetric, no curvature, ROM normal, no CVA tenderness  Lungs:     Clear to auscultation bilaterally, respirations unlabored  Chest Wall:    No tenderness or deformity   Heart:    Regular rate and rhythm, S1 and S2 normal, no murmur, rub   or  gallop     Abdomen:     Soft, tender in suprapubic area, bowel sounds sluggish in all four  quadrants,   no masses, no organomegaly  Genitalia:    Pelvic: cervix normal in appearance, external genitalia normal, fullness appreciated on right adnexal area without discreet masses,  no cervical motion tenderness, rectovaginal septum normal, uterus normal size, shape, and consistency and vagina normal without discharge  Extremities:   Extremities normal, atraumatic, no cyanosis or edema  Pulses:   2+ and symmetric all extremities  Skin:   Skin color, texture, turgor normal, no rashes or lesions  Lymph nodes:   Cervical, supraclavicular, and axillary nodes normal  Neurologic:   CNII-XII intact, normal strength, sensation and reflexes    throughout    No results found for: HGBA1C  Lab Results  Component Value Date   CREATININE 1.07 11/29/2014   CREATININE 1.00 08/02/2014   CREATININE 1.1 11/26/2013    Lab Results  Component Value Date   WBC 10.0 04/18/2015   HGB 13.1 04/18/2015   HCT 38.3 04/18/2015   PLT 208.0 04/18/2015   GLUCOSE 89 11/29/2014   CHOL 183 11/29/2014   TRIG 231.0* 11/29/2014   HDL 59.30 11/29/2014   LDLDIRECT 104.0 11/29/2014   LDLCALC 93 03/30/2013   ALT 18 11/29/2014   AST 22 11/29/2014   NA 142 11/29/2014   K 4.9 11/29/2014   CL 106 11/29/2014   CREATININE 1.07 11/29/2014   BUN 19 11/29/2014   CO2 25 11/29/2014   TSH 0.68 11/29/2014    Mm Screening Breast Tomo Bilateral  11/24/2014  CLINICAL DATA:  Screening. EXAM: DIGITAL SCREENING BILATERAL MAMMOGRAM WITH 3D TOMO WITH CAD COMPARISON:  Previous exam(s). ACR Breast Density Category a: The breast tissue is almost entirely fatty. FINDINGS: There are no findings suspicious for malignancy. Images were processed with CAD. IMPRESSION: No mammographic evidence of malignancy. A result letter of this screening mammogram will be mailed directly to the patient. RECOMMENDATION: Screening mammogram in one year.  (Code:SM-B-01Y) BI-RADS CATEGORY  1: Negative. Electronically Signed   By: Franki Cabot M.D.   On: 11/24/2014 15:24    Assessment & Plan:   Problem List Items Addressed This Visit    Abdominal pain    Exam suggests constipation vs pelvic mass.  Plain filns suggest constipation.  CA 125 pending.  UA and CBC normal,  Trial of lactulose.        Other Visit Diagnoses    Dysuria    -  Primary    Relevant Orders    POCT Urinalysis Dipstick (Completed)    Urine Culture    Urinalysis, Routine w  reflex microscopic (Completed)    CBC with Differential/Platelet (Completed)    Bloating symptom        Relevant Orders    CA 125    DG Abd 2 Views (Completed)    Comp Met (CMET)       I am having Ms. Ebel start on HYDROcodone-acetaminophen and Lactulose. I am also having her maintain her Vitamin D3, magnesium oxide, ibuprofen, aspirin, omeprazole, loratadine, ALPRAZolam, fluticasone, zoster vaccine live (PF), Melatonin, zoster vaccine live (PF), amLODipine, enalapril, simvastatin, and simvastatin.  Meds ordered this encounter  Medications  . HYDROcodone-acetaminophen (NORCO/VICODIN) 5-325 MG tablet    Sig: Take 1 tablet by mouth every 6 (six) hours as needed for moderate pain.    Dispense:  30 tablet    Refill:  0  . Lactulose 20 GM/30ML SOLN    Sig: 30 ml every 4 hours until constipation is relieved    Dispense:  236 mL    Refill:  3    There are no discontinued medications.  Follow-up: Return in about 1 week (around 04/25/2015).   Crecencio Mc, MD

## 2015-04-19 ENCOUNTER — Ambulatory Visit
Admission: RE | Admit: 2015-04-19 | Discharge: 2015-04-19 | Disposition: A | Discharge status: Home / Self Care | Payer: Medicare Other | Source: Ambulatory Visit | Attending: Internal Medicine | Admitting: Internal Medicine

## 2015-04-19 ENCOUNTER — Encounter: Payer: Self-pay | Admitting: Internal Medicine

## 2015-04-19 DIAGNOSIS — R140 Abdominal distension (gaseous): Secondary | ICD-10-CM

## 2015-04-19 DIAGNOSIS — R109 Unspecified abdominal pain: Secondary | ICD-10-CM | POA: Diagnosis not present

## 2015-04-19 LAB — CBC WITH DIFFERENTIAL/PLATELET
BASOS ABS: 0.2 10*3/uL — AB (ref 0.0–0.1)
Basophils Relative: 1.5 % (ref 0.0–3.0)
EOS PCT: 1.1 % (ref 0.0–5.0)
Eosinophils Absolute: 0.1 10*3/uL (ref 0.0–0.7)
HEMATOCRIT: 38.3 % (ref 36.0–46.0)
HEMOGLOBIN: 13.1 g/dL (ref 12.0–15.0)
LYMPHS ABS: 2.6 10*3/uL (ref 0.7–4.0)
LYMPHS PCT: 26 % (ref 12.0–46.0)
MCHC: 34.2 g/dL (ref 30.0–36.0)
MCV: 86.9 fl (ref 78.0–100.0)
MONOS PCT: 6 % (ref 3.0–12.0)
Monocytes Absolute: 0.6 10*3/uL (ref 0.1–1.0)
Neutro Abs: 6.6 10*3/uL (ref 1.4–7.7)
Neutrophils Relative %: 65.4 % (ref 43.0–77.0)
Platelets: 208 10*3/uL (ref 150.0–400.0)
RBC: 4.41 Mil/uL (ref 3.87–5.11)
RDW: 13.7 % (ref 11.5–15.5)
WBC: 10 10*3/uL (ref 4.0–10.5)

## 2015-04-19 LAB — URINALYSIS, ROUTINE W REFLEX MICROSCOPIC
BILIRUBIN URINE: NEGATIVE
HGB URINE DIPSTICK: NEGATIVE
Ketones, ur: NEGATIVE
LEUKOCYTES UA: NEGATIVE
NITRITE: NEGATIVE
PH: 5.5 (ref 5.0–8.0)
RBC / HPF: NONE SEEN (ref 0–?)
Specific Gravity, Urine: 1.01 (ref 1.000–1.030)
TOTAL PROTEIN, URINE-UPE24: NEGATIVE
Urine Glucose: NEGATIVE
Urobilinogen, UA: 0.2 (ref 0.0–1.0)

## 2015-04-19 MED ORDER — LACTULOSE 20 GM/30ML PO SOLN: ORAL | Status: DC

## 2015-04-19 NOTE — Assessment & Plan Note (Signed)
Exam suggests constipation vs pelvic mass.  Plain filns suggest constipation.  CA 125 pending.  UA and CBC normal,  Trial of lactulose.

## 2015-04-20 LAB — CA 125: CA 125: 8 U/mL (ref <35)

## 2015-04-21 ENCOUNTER — Encounter: Payer: Self-pay | Admitting: Internal Medicine

## 2015-04-21 DIAGNOSIS — R140 Abdominal distension (gaseous): Secondary | ICD-10-CM

## 2015-04-21 DIAGNOSIS — R1030 Lower abdominal pain, unspecified: Secondary | ICD-10-CM

## 2015-04-22 ENCOUNTER — Encounter: Payer: Self-pay | Admitting: Internal Medicine

## 2015-04-25 ENCOUNTER — Telehealth: Payer: Self-pay | Admitting: Internal Medicine

## 2015-04-25 DIAGNOSIS — R1030 Lower abdominal pain, unspecified: Secondary | ICD-10-CM

## 2015-04-25 NOTE — Telephone Encounter (Signed)
[place] of pelvis added as requested by [facility] .

## 2015-04-27 ENCOUNTER — Ambulatory Visit
Admission: RE | Admit: 2015-04-27 | Discharge: 2015-04-27 | Disposition: A | Discharge status: Home / Self Care | Payer: Medicare Other | Source: Ambulatory Visit | Attending: Internal Medicine | Admitting: Internal Medicine

## 2015-04-27 ENCOUNTER — Encounter: Payer: Self-pay | Admitting: Internal Medicine

## 2015-04-27 DIAGNOSIS — R1030 Lower abdominal pain, unspecified: Secondary | ICD-10-CM | POA: Insufficient documentation

## 2015-04-27 DIAGNOSIS — R140 Abdominal distension (gaseous): Secondary | ICD-10-CM | POA: Diagnosis not present

## 2015-04-29 ENCOUNTER — Telehealth: Payer: Self-pay | Admitting: Internal Medicine

## 2015-04-29 NOTE — Telephone Encounter (Signed)
[facility] called and stated could not run Urine culture because collection date was 04/18/15 and just received specimen on 04/28/15.

## 2015-05-20 ENCOUNTER — Encounter: Payer: Self-pay | Admitting: Internal Medicine

## 2015-05-30 ENCOUNTER — Ambulatory Visit (INDEPENDENT_AMBULATORY_CARE_PROVIDER_SITE_OTHER): Payer: Medicare Other | Admitting: Internal Medicine

## 2015-05-30 ENCOUNTER — Encounter: Payer: Self-pay | Admitting: Internal Medicine

## 2015-05-30 VITALS — BP 108/70 | HR 65 | Temp 98.1°F | Resp 12 | Ht 66.0 in | Wt 203.0 lb

## 2015-05-30 DIAGNOSIS — E785 Hyperlipidemia, unspecified: Secondary | ICD-10-CM | POA: Diagnosis not present

## 2015-05-30 DIAGNOSIS — E739 Lactose intolerance, unspecified: Secondary | ICD-10-CM | POA: Diagnosis not present

## 2015-05-30 DIAGNOSIS — K900 Celiac disease: Secondary | ICD-10-CM

## 2015-05-30 DIAGNOSIS — Z79899 Other long term (current) drug therapy: Secondary | ICD-10-CM

## 2015-05-30 DIAGNOSIS — K9041 Non-celiac gluten sensitivity: Secondary | ICD-10-CM

## 2015-05-30 LAB — COMPREHENSIVE METABOLIC PANEL
ALBUMIN: 4.4 g/dL (ref 3.5–5.2)
ALT: 22 U/L (ref 0–35)
AST: 24 U/L (ref 0–37)
Alkaline Phosphatase: 60 U/L (ref 39–117)
BUN: 20 mg/dL (ref 6–23)
CALCIUM: 10 mg/dL (ref 8.4–10.5)
CHLORIDE: 106 meq/L (ref 96–112)
CO2: 27 meq/L (ref 19–32)
CREATININE: 1.03 mg/dL (ref 0.40–1.20)
GFR: 57.01 mL/min — AB (ref >60.00)
Glucose, Bld: 91 mg/dL (ref 70–99)
POTASSIUM: 4.8 meq/L (ref 3.5–5.1)
Sodium: 142 mEq/L (ref 135–145)
Total Bilirubin: 0.3 mg/dL (ref 0.2–1.2)
Total Protein: 6.9 g/dL (ref 6.0–8.3)

## 2015-05-30 LAB — LIPID PANEL
Cholesterol: 187 mg/dL (ref 0–200)
HDL: 57.5 mg/dL (ref >39.00)
NONHDL: 129.24
Total CHOL/HDL Ratio: 3
Triglycerides: 240 mg/dL — ABNORMAL HIGH (ref 0.0–149.0)
VLDL: 48 mg/dL — AB (ref 0.0–40.0)

## 2015-05-30 LAB — LDL CHOLESTEROL, DIRECT: LDL DIRECT: 85 mg/dL

## 2015-05-30 NOTE — Progress Notes (Signed)
Subjective:  Patient ID: Jillian [name], female    DOB: Jan 01, 1951  Age: 65 y.o. MRN: [id number]  CC: The primary encounter diagnosis was Lactose intolerance. Diagnoses of Gluten intolerance, Hyperlipidemia, and Long-term use of high-risk medication were also pertinent to this visit.  HPI [name] presents for 3 month follow up on lower abdominal / pelvic symptoms .  Previous evaluation  One month ago suggested constipation vs pelvic mass.  CA 125 and pelvic ultrasound were done, ovaries not visualized (not seen on 2011 ultrasound either) .  symptoms have resolved.  Has diverticulosis and IBS.  No history of diverticuliitis.   Wants testing for food allergies.  sometimes develops cramps and diarrhea 3-4 hours after eating dairy .  Drinks Lactaid but doesn't use Lactase  Does not digest corn,  Peas,  Sees it in her stool  Eats wheat bread and gets bloated     Outpatient Medications Prior to Visit  Medication Sig Dispense Refill  . ALPRAZolam (XANAX) 0.25 MG tablet Take 1 tablet (0.25 mg total) by mouth at bedtime as needed for sleep. (Patient not taking: Reported on 11/09/2015) 30 tablet 2  . aspirin 81 MG tablet Take 81 mg by mouth daily.      . Cholecalciferol (VITAMIN D3) 1000 UNITS CAPS Take 1 capsule by mouth daily.      . enalapril (VASOTEC) 20 MG tablet TAKE 1 TABLET TWICE DAILY 180 tablet 0  . HYDROcodone-acetaminophen (NORCO/VICODIN) 5-325 MG tablet Take 1 tablet by mouth every 6 (six) hours as needed for moderate pain. (Patient not taking: Reported on 11/09/2015) 30 tablet 0  . ibuprofen (ADVIL,MOTRIN) 200 MG tablet Take 400 mg by mouth every 6 (six) hours as needed.      . loratadine (CLARITIN) 10 MG tablet Take 10 mg by mouth daily.    . magnesium oxide (MAG-OX) 400 MG tablet Take 400 mg by mouth daily.      . Melatonin 5 MG CAPS Take 1 capsule by mouth at bedtime as needed.    Marland Kitchen omeprazole (PRILOSEC) 20 MG capsule Take 1 capsule (20 mg total) by mouth daily as  needed. 90 capsule 3  . amLODipine (NORVASC) 2.5 MG tablet TAKE 1 TABLET DAILY 90 tablet 0  . fluticasone (FLONASE) 50 MCG/ACT nasal spray Place 2 sprays into the nose daily. 16 g 5  . simvastatin (ZOCOR) 40 MG tablet TAKE 1 TABLET EVERY NIGHT AT BEDTIME 90 tablet 0  . zoster vaccine live, PF, (ZOSTAVAX) 09811 UNT/0.65ML injection Inject 19,400 Units into the skin once. 1 each 0  . zoster vaccine live, PF, (ZOSTAVAX) 91478 UNT/0.65ML injection Inject 19,400 Units into the skin once. 1 each 0  . Lactulose 20 GM/30ML SOLN 30 ml every 4 hours until constipation is relieved 236 mL 3  . simvastatin (ZOCOR) 40 MG tablet TAKE 1 TABLET EVERY NIGHT AT BEDTIME 90 tablet 0   No facility-administered medications prior to visit.     Review of Systems;  Patient denies headache, fevers, malaise, unintentional weight loss, skin rash, eye pain, sinus congestion and sinus pain, sore throat, dysphagia,  hemoptysis , cough, dyspnea, wheezing, chest pain, palpitations, orthopnea, edema, abdominal pain, nausea, melena, diarrhea, constipation, flank pain, dysuria, hematuria, urinary  Frequency, nocturia, numbness, tingling, seizures,  Focal weakness, Loss of consciousness,  Tremor, insomnia, depression, anxiety, and suicidal ideation.      Objective:  BP 108/70   Pulse 65   Temp 98.1 F (36.7 C) (Oral)   Resp 12  Ht 5\' 6"  (1.676 m)   Wt 203 lb (92.1 kg)   SpO2 97%   BMI 32.77 kg/m   BP Readings from Last 3 Encounters:  11/09/15 118/64  11/01/15 128/74  08/18/15 134/72    Wt Readings from Last 3 Encounters:  11/09/15 199 lb (90.3 kg)  11/01/15 199 lb 3.2 oz (90.4 kg)  08/18/15 201 lb 6 oz (91.3 kg)    General appearance: alert, cooperative and appears stated age Ears: normal TM's and external ear canals both ears Throat: lips, mucosa, and tongue normal; teeth and gums normal Neck: no adenopathy, no carotid bruit, supple, symmetrical, trachea midline and thyroid not enlarged, symmetric, no  tenderness/mass/nodules Back: symmetric, no curvature. ROM normal. No CVA tenderness. Lungs: clear to auscultation bilaterally Heart: regular rate and rhythm, S1, S2 normal, no murmur, click, rub or gallop Abdomen: soft, non-tender; bowel sounds normal; no masses,  no organomegaly Pulses: 2+ and symmetric Skin: Skin color, texture, turgor normal. No rashes or lesions Lymph nodes: Cervical, supraclavicular, and axillary nodes normal.  No results found for: HGBA1C  Lab Results  Component Value Date   CREATININE 1.03 05/30/2015   CREATININE 1.07 11/29/2014   CREATININE 1.00 08/02/2014    Lab Results  Component Value Date   WBC WILL FOLLOW 05/30/2015   HGB 13.1 04/18/2015   HCT WILL FOLLOW 05/30/2015   PLT WILL FOLLOW 05/30/2015   GLUCOSE 91 05/30/2015   CHOL 187 05/30/2015   TRIG 240.0 (H) 05/30/2015   HDL 57.50 05/30/2015   LDLDIRECT 85.0 05/30/2015   LDLCALC 93 03/30/2013   ALT 22 05/30/2015   AST 24 05/30/2015   NA 142 05/30/2015   K 4.8 05/30/2015   CL 106 05/30/2015   CREATININE 1.03 05/30/2015   BUN 20 05/30/2015   CO2 27 05/30/2015   TSH 0.68 11/29/2014    US Transvaginal Non-ob  04/27/2015  CLINICAL DATA:  Lower abdominal pain and bloating. EXAM: TRANSABDOMINAL AND TRANSVAGINAL ULTRASOUND OF PELVIS TECHNIQUE: Both transabdominal and transvaginal ultrasound examinations of the pelvis were performed. Transabdominal technique was performed for global imaging of the pelvis including uterus, ovaries, adnexal regions, and pelvic cul-de-sac. It was necessary to proceed with endovaginal exam following the transabdominal exam to visualize the endometrium and adnexal regions. COMPARISON:  11/24/2009. FINDINGS: Uterus Measurements: 7.5 x 3.1 x 4.3 cm. No fibroids or other mass visualized. Endometrium Thickness: 4 mm, within normal limits. No focal abnormality visualized. Right ovary Not visualized. Left ovary Not visualized. Other findings No abnormal free fluid. IMPRESSION: No  findings to explain the patient's symptoms. Ovaries are not visualized. Electronically Signed   By: Lorin Picket M.D.   On: 04/27/2015 14:40   US Pelvis Complete  04/27/2015  CLINICAL DATA:  Lower abdominal pain and bloating. EXAM: TRANSABDOMINAL AND TRANSVAGINAL ULTRASOUND OF PELVIS TECHNIQUE: Both transabdominal and transvaginal ultrasound examinations of the pelvis were performed. Transabdominal technique was performed for global imaging of the pelvis including uterus, ovaries, adnexal regions, and pelvic cul-de-sac. It was necessary to proceed with endovaginal exam following the transabdominal exam to visualize the endometrium and adnexal regions. COMPARISON:  11/24/2009. FINDINGS: Uterus Measurements: 7.5 x 3.1 x 4.3 cm. No fibroids or other mass visualized. Endometrium Thickness: 4 mm, within normal limits. No focal abnormality visualized. Right ovary Not visualized. Left ovary Not visualized. Other findings No abnormal free fluid. IMPRESSION: No findings to explain the patient's symptoms. Ovaries are not visualized. Electronically Signed   By: Lorin Picket M.D.   On: 04/27/2015 14:40  Assessment & Plan:   Problem List Items Addressed This Visit    Lactose intolerance - Primary    Advised to try takiing Lactaise  Or eliminate dairy from diet.       Gluten intolerance    Celiac disease screen was negative.  Considering IBS as cause of patient's GI issues.  IBS handout given       Relevant Orders   Celiac Disease Ab Screen w/Rfx (Completed)   Hyperlipidemia   Relevant Orders   Lipid panel (Completed)    Other Visit Diagnoses    Long-term use of high-risk medication       Relevant Orders   Comprehensive metabolic panel (Completed)     A total of 25 minutes of face to face time was spent with patient more than half of which was spent in counselling and coordination of care   I have discontinued Ms. Sexson's simvastatin. I am also having her maintain her Vitamin D3, magnesium  oxide, ibuprofen, aspirin, omeprazole, loratadine, ALPRAZolam, zoster vaccine live (PF), Melatonin, zoster vaccine live (PF), enalapril, and HYDROcodone-acetaminophen.  No orders of the defined types were placed in this encounter.   Medications Discontinued During This Encounter  Medication Reason  . simvastatin (ZOCOR) 40 MG tablet Duplicate    Follow-up: No Follow-up on file.   Crecencio Mc, MD

## 2015-05-30 NOTE — Progress Notes (Signed)
Pre-visit discussion using our clinic review tool. No additional management support is needed unless otherwise documented below in the visit note.  

## 2015-05-30 NOTE — Patient Instructions (Addendum)
You should try Lactase with every meal containing dairy  IBS food handout may help you avoid certain foods.   If you  Still want allergy testing let me know

## 2015-05-31 LAB — CELIAC DISEASE AB SCREEN W/RFX
ANTIGLIADIN ABS, IGA: 2 U (ref 0–19)
IgA/Immunoglobulin A, Serum: 150 mg/dL (ref 87–352)

## 2015-05-31 LAB — SPECIMEN STATUS

## 2015-06-02 ENCOUNTER — Encounter: Payer: Self-pay | Admitting: Internal Medicine

## 2015-06-09 ENCOUNTER — Other Ambulatory Visit: Payer: Self-pay | Admitting: Internal Medicine

## 2015-06-09 ENCOUNTER — Encounter: Payer: Self-pay | Admitting: Internal Medicine

## 2015-06-09 MED ORDER — FLUTICASONE PROPIONATE 50 MCG/ACT NA SUSP: NASAL | Status: DC

## 2015-06-23 ENCOUNTER — Other Ambulatory Visit: Payer: Self-pay | Admitting: Internal Medicine

## 2015-06-27 ENCOUNTER — Other Ambulatory Visit: Payer: Self-pay | Admitting: Internal Medicine

## 2015-07-21 ENCOUNTER — Ambulatory Visit (INDEPENDENT_AMBULATORY_CARE_PROVIDER_SITE_OTHER): Payer: Medicare Other | Admitting: Internal Medicine

## 2015-07-21 VITALS — BP 136/78 | HR 76 | Temp 98.1°F | Resp 12 | Ht 66.0 in | Wt 202.5 lb

## 2015-07-21 DIAGNOSIS — B372 Candidiasis of skin and nail: Secondary | ICD-10-CM | POA: Diagnosis not present

## 2015-07-21 DIAGNOSIS — N760 Acute vaginitis: Secondary | ICD-10-CM | POA: Diagnosis not present

## 2015-07-21 MED ORDER — FLUCONAZOLE 150 MG PO TABS: 150.0000 mg | ORAL_TABLET | Freq: Every day | ORAL | Status: DC

## 2015-07-21 MED ORDER — NYSTATIN 100000 UNIT/GM EX OINT: 1.0000 "application " | TOPICAL_OINTMENT | Freq: Two times a day (BID) | CUTANEOUS | Status: DC

## 2015-07-21 NOTE — Progress Notes (Signed)
Subjective:  Patient ID: Jillian [name], female    DOB: 02-02-1951  Age: 65 y.o. MRN: [id number]  CC: The primary encounter diagnosis was Vaginitis and vulvovaginitis. A diagnosis of Candidal skin infection was also pertinent to this visit.  HPI Jillian [name] presents for vaginal discharge and itching for the past  2 weeks..  She has no history of recent sexual acitivty , expsoure to swimming pool water, public  saunas,  recent use of antibiotics or oral steroids. .  She does recall that she changed detergents recently but has rewahed everything.    2)She has also developed macular round spots on both inner thighs that were non pruritic.  She has been applying OTC hydrocortisone cream and they have been shrinking.      Outpatient Prescriptions Prior to Visit  Medication Sig Dispense Refill  . ALPRAZolam (XANAX) 0.25 MG tablet Take 1 tablet (0.25 mg total) by mouth at bedtime as needed for sleep. 30 tablet 2  . amLODipine (NORVASC) 2.5 MG tablet TAKE 1 TABLET DAILY 30 tablet 1  . aspirin 81 MG tablet Take 81 mg by mouth daily.      . Cholecalciferol (VITAMIN D3) 1000 UNITS CAPS Take 1 capsule by mouth daily.      . enalapril (VASOTEC) 20 MG tablet TAKE 1 TABLET TWICE DAILY 180 tablet 0  . enalapril (VASOTEC) 20 MG tablet TAKE 1 TABLET TWICE DAILY 180 tablet 1  . fluticasone (FLONASE) 50 MCG/ACT nasal spray Place 2 sprays into the nose daily. 16 g 5  . HYDROcodone-acetaminophen (NORCO/VICODIN) 5-325 MG tablet Take 1 tablet by mouth every 6 (six) hours as needed for moderate pain. 30 tablet 0  . ibuprofen (ADVIL,MOTRIN) 200 MG tablet Take 400 mg by mouth every 6 (six) hours as needed.      . Lactulose 20 GM/30ML SOLN 30 ml every 4 hours until constipation is relieved 236 mL 3  . loratadine (CLARITIN) 10 MG tablet Take 10 mg by mouth daily.    . magnesium oxide (MAG-OX) 400 MG tablet Take 400 mg by mouth daily.      . Melatonin 5 MG CAPS Take 1 capsule by mouth at bedtime as  needed.    Marland Kitchen omeprazole (PRILOSEC) 20 MG capsule Take 1 capsule (20 mg total) by mouth daily as needed. 90 capsule 3  . simvastatin (ZOCOR) 40 MG tablet TAKE 1 TABLET EVERY NIGHT AT BEDTIME 90 tablet 0  . zoster vaccine live, PF, (ZOSTAVAX) 16109 UNT/0.65ML injection Inject 19,400 Units into the skin once. (Patient not taking: Reported on 07/21/2015) 1 each 0  . zoster vaccine live, PF, (ZOSTAVAX) 60454 UNT/0.65ML injection Inject 19,400 Units into the skin once. (Patient not taking: Reported on 07/21/2015) 1 each 0   No facility-administered medications prior to visit.    Review of Systems;  Patient denies headache, fevers, malaise, unintentional weight loss,  eye pain, sinus congestion and sinus pain, sore throat, dysphagia,  hemoptysis , cough, dyspnea, wheezing, chest pain, palpitations, orthopnea, edema, abdominal pain, nausea, melena, diarrhea, constipation, flank pain, dysuria, hematuria, urinary  Frequency, nocturia, numbness, tingling, seizures,  Focal weakness, Loss of consciousness,  Tremor, insomnia, depression, anxiety, and suicidal ideation.      Objective:  BP 136/78 mmHg  Pulse 76  Temp(Src) 98.1 F (36.7 C) (Oral)  Resp 12  Ht 5\' 6"  (1.676 m)  Wt 202 lb 8 oz (91.853 kg)  BMI 32.70 kg/m2  SpO2 96%  BP Readings from Last 3 Encounters:  07/21/15 136/78  05/30/15 108/70  04/18/15 134/78    Wt Readings from Last 3 Encounters:  07/21/15 202 lb 8 oz (91.853 kg)  05/30/15 203 lb (92.08 kg)  04/18/15 203 lb 12 oz (92.42 kg)   General Appearance:    Alert, cooperative, no distress, appears stated age     Back:     Symmetric, no curvature, ROM normal, no CVA tenderness  Lungs:     Clear to auscultation bilaterally, respirations unlabored  Chest Wall:    No tenderness or deformity   Heart:    Regular rate and rhythm, S1 and S2 normal, no murmur, rub   or gallop     Abdomen:     Soft, non-tender, bowel sounds active all four quadrants,    no masses, no organomegaly    Genitalia:    Pelvic: cervix normal in appearance, external genitalia normal, no adnexal masses or tenderness, no cervical motion tenderness, scant vaginal  Discharge. No malodor.   Extremities:   Extremities normal, atraumatic, no cyanosis or edema  Pulses:   2+ and symmetric all extremities  Skin:   Several dime sized  pink annular macules on medial thighs bilaterally. No crusting,    Lymph nodes:   Cervical, supraclavicular, and axillary nodes normal  Neurologic:   CNII-XII intact, normal strength, sensation and reflexes    throughout    No results found for: HGBA1C  Lab Results  Component Value Date   CREATININE 1.03 05/30/2015   CREATININE 1.07 11/29/2014   CREATININE 1.00 08/02/2014    Lab Results  Component Value Date   WBC WILL FOLLOW 05/30/2015   HGB 13.1 04/18/2015   HCT WILL FOLLOW 05/30/2015   PLT WILL FOLLOW 05/30/2015   GLUCOSE 91 05/30/2015   CHOL 187 05/30/2015   TRIG 240.0* 05/30/2015   HDL 57.50 05/30/2015   LDLDIRECT 85.0 05/30/2015   LDLCALC 93 03/30/2013   ALT 22 05/30/2015   AST 24 05/30/2015   NA 142 05/30/2015   K 4.8 05/30/2015   CL 106 05/30/2015   CREATININE 1.03 05/30/2015   BUN 20 05/30/2015   CO2 27 05/30/2015   TSH 0.68 11/29/2014    US Transvaginal Non-ob  04/27/2015  CLINICAL DATA:  Lower abdominal pain and bloating. EXAM: TRANSABDOMINAL AND TRANSVAGINAL ULTRASOUND OF PELVIS TECHNIQUE: Both transabdominal and transvaginal ultrasound examinations of the pelvis were performed. Transabdominal technique was performed for global imaging of the pelvis including uterus, ovaries, adnexal regions, and pelvic cul-de-sac. It was necessary to proceed with endovaginal exam following the transabdominal exam to visualize the endometrium and adnexal regions. COMPARISON:  11/24/2009. FINDINGS: Uterus Measurements: 7.5 x 3.1 x 4.3 cm. No fibroids or other mass visualized. Endometrium Thickness: 4 mm, within normal limits. No focal abnormality visualized.  Right ovary Not visualized. Left ovary Not visualized. Other findings No abnormal free fluid. IMPRESSION: No findings to explain the patient's symptoms. Ovaries are not visualized. Electronically Signed   By: Lorin Picket M.D.   On: 04/27/2015 14:40   US Pelvis Complete  04/27/2015  CLINICAL DATA:  Lower abdominal pain and bloating. EXAM: TRANSABDOMINAL AND TRANSVAGINAL ULTRASOUND OF PELVIS TECHNIQUE: Both transabdominal and transvaginal ultrasound examinations of the pelvis were performed. Transabdominal technique was performed for global imaging of the pelvis including uterus, ovaries, adnexal regions, and pelvic cul-de-sac. It was necessary to proceed with endovaginal exam following the transabdominal exam to visualize the endometrium and adnexal regions. COMPARISON:  11/24/2009. FINDINGS: Uterus Measurements: 7.5 x 3.1 x 4.3 cm. No fibroids or other mass visualized.  Endometrium Thickness: 4 mm, within normal limits. No focal abnormality visualized. Right ovary Not visualized. Left ovary Not visualized. Other findings No abnormal free fluid. IMPRESSION: No findings to explain the patient's symptoms. Ovaries are not visualized. Electronically Signed   By: Lorin Picket M.D.   On: 04/27/2015 14:40    Assessment & Plan:   Problem List Items Addressed This Visit    Vaginitis and vulvovaginitis - Primary    EMPIRIC TREATMENT WITH FLUTICASONE PENDING culture results.       Relevant Orders   Culture, routine-genital   WET PREP FOR [place], YEAST, CLUE   Candidal skin infection    Treating empirically with nystatin for rash on medial thighs.       Relevant Medications   nystatin ointment (MYCOSTATIN)   fluconazole (DIFLUCAN) 150 MG tablet     A total of 25 minutes of face to face time was spent with patient more than half of which was spent in counselling about the above mentioned conditions  and coordination of care   I am having Ms. Dixson start on nystatin ointment and fluconazole. I am  also having her maintain her Vitamin D3, magnesium oxide, ibuprofen, aspirin, omeprazole, loratadine, ALPRAZolam, zoster vaccine live (PF), Melatonin, zoster vaccine live (PF), enalapril, simvastatin, HYDROcodone-acetaminophen, Lactulose, fluticasone, amLODipine, and enalapril.  Meds ordered this encounter  Medications  . nystatin ointment (MYCOSTATIN)    Sig: Apply 1 application topically 2 (two) times daily.    Dispense:  30 g    Refill:  2  . fluconazole (DIFLUCAN) 150 MG tablet    Sig: Take 1 tablet (150 mg total) by mouth daily.    Dispense:  2 tablet    Refill:  0    There are no discontinued medications.  Follow-up: No Follow-up on file.   Crecencio Mc, MD

## 2015-07-21 NOTE — Patient Instructions (Addendum)
We will try Nystatin ointment for the itchy rash on your inner thighs  . Use it twice daily fo r 7-10 days.  After the rash resolves,  Start using [employer] medicated powder    I am Prescribing for vaginitis pending results form cultures taken today

## 2015-07-21 NOTE — Progress Notes (Signed)
Pre-visit discussion using our clinic review tool. No additional management support is needed unless otherwise documented below in the visit note.  

## 2015-07-23 LAB — WET PREP BY MOLECULAR PROBE
CANDIDA SPECIES: NEGATIVE
Gardnerella vaginalis: NEGATIVE
TRICHOMONAS VAG: NEGATIVE

## 2015-07-23 NOTE — Assessment & Plan Note (Signed)
Treating empirically with nystatin for rash on medial thighs.

## 2015-07-23 NOTE — Assessment & Plan Note (Signed)
EMPIRIC TREATMENT WITH FLUTICASONE PENDING culture results.

## 2015-07-29 LAB — SURESWAB HSV, TYPE 1/2 DNA, PCR
HSV 1 DNA: NOT DETECTED
HSV 2 DNA: NOT DETECTED

## 2015-07-29 LAB — SURESWAB, VAGINOSIS/VAGINITIS PLUS
ATOPOBIUM VAGINAE: NOT DETECTED Log (cells/mL)
C. ALBICANS, DNA: NOT DETECTED
C. TRACHOMATIS RNA, TMA: NOT DETECTED
C. glabrata, DNA: NOT DETECTED
C. parapsilosis, DNA: NOT DETECTED
C. tropicalis, DNA: NOT DETECTED
GARDNERELLA VAGINALIS: 5.2 Log (cells/mL)
LACTOBACILLUS SPECIES: NOT DETECTED Log (cells/mL)
MEGASPHAERA SPECIES: NOT DETECTED Log (cells/mL)
N. GONORRHOEAE RNA, TMA: NOT DETECTED
T. vaginalis RNA, QL TMA: NOT DETECTED

## 2015-07-31 ENCOUNTER — Encounter: Payer: Self-pay | Admitting: Internal Medicine

## 2015-08-03 ENCOUNTER — Encounter: Payer: Self-pay | Admitting: Internal Medicine

## 2015-08-04 ENCOUNTER — Other Ambulatory Visit: Payer: Self-pay | Admitting: Internal Medicine

## 2015-08-04 MED ORDER — TRIAMCINOLONE ACETONIDE 0.1 % EX CREA: 1.0000 "application " | TOPICAL_CREAM | Freq: Two times a day (BID) | CUTANEOUS | Status: DC

## 2015-08-18 ENCOUNTER — Encounter: Payer: Self-pay | Admitting: Family Medicine

## 2015-08-18 ENCOUNTER — Ambulatory Visit (INDEPENDENT_AMBULATORY_CARE_PROVIDER_SITE_OTHER): Payer: Medicare Other | Admitting: Family Medicine

## 2015-08-18 VITALS — BP 134/72 | HR 67 | Temp 98.0°F | Ht 66.0 in | Wt 201.4 lb

## 2015-08-18 DIAGNOSIS — H6980 Other specified disorders of Eustachian tube, unspecified ear: Secondary | ICD-10-CM | POA: Insufficient documentation

## 2015-08-18 DIAGNOSIS — J0100 Acute maxillary sinusitis, unspecified: Secondary | ICD-10-CM | POA: Diagnosis not present

## 2015-08-18 MED ORDER — AMOXICILLIN-POT CLAVULANATE 875-125 MG PO TABS: 1.0000 | ORAL_TABLET | Freq: Two times a day (BID) | ORAL | Status: DC

## 2015-08-18 NOTE — Assessment & Plan Note (Signed)
New acute problem. Given duration of symptoms and physical exam, treating empirically with Augmentin.

## 2015-08-18 NOTE — Progress Notes (Signed)
Pre visit review using our clinic review tool, if applicable. No additional management support is needed unless otherwise documented below in the visit note. 

## 2015-08-18 NOTE — Patient Instructions (Signed)
Take the antibiotic as prescribed.  Follow up as needed.  Take care  Dr. Arnetha Silverthorne   Sinusitis, Adult Sinusitis is redness, soreness, and inflammation of the paranasal sinuses. Paranasal sinuses are air pockets within the bones of your face. They are located beneath your eyes, in the middle of your forehead, and above your eyes. In healthy paranasal sinuses, mucus is able to drain out, and air is able to circulate through them by way of your nose. However, when your paranasal sinuses are inflamed, mucus and air can become trapped. This can allow bacteria and other germs to grow and cause infection. Sinusitis can develop quickly and last only a short time (acute) or continue over a long period (chronic). Sinusitis that lasts for more than 12 weeks is considered chronic. CAUSES Causes of sinusitis include:  Allergies.  Structural abnormalities, such as displacement of the cartilage that separates your nostrils (deviated septum), which can decrease the air flow through your nose and sinuses and affect sinus drainage.  Functional abnormalities, such as when the small hairs (cilia) that line your sinuses and help remove mucus do not work properly or are not present. SIGNS AND SYMPTOMS Symptoms of acute and chronic sinusitis are the same. The primary symptoms are pain and pressure around the affected sinuses. Other symptoms include:  Upper toothache.  Earache.  Headache.  Bad breath.  Decreased sense of smell and taste.  A cough, which worsens when you are lying flat.  Fatigue.  Fever.  Thick drainage from your nose, which often is green and may contain pus (purulent).  Swelling and warmth over the affected sinuses. DIAGNOSIS Your health care provider will perform a physical exam. During your exam, your health care provider may perform any of the following to help determine if you have acute sinusitis or chronic sinusitis:  Look in your nose for signs of abnormal growths in your  nostrils (nasal polyps).  Tap over the affected sinus to check for signs of infection.  View the inside of your sinuses using an imaging device that has a light attached (endoscope). If your health care provider suspects that you have chronic sinusitis, one or more of the following tests may be recommended:  Allergy tests.  Nasal culture. A sample of mucus is taken from your nose, sent to a lab, and screened for bacteria.  Nasal cytology. A sample of mucus is taken from your nose and examined by your health care provider to determine if your sinusitis is related to an allergy. TREATMENT Most cases of acute sinusitis are related to a viral infection and will resolve on their own within 10 days. Sometimes, medicines are prescribed to help relieve symptoms of both acute and chronic sinusitis. These may include pain medicines, decongestants, nasal steroid sprays, or saline sprays. However, for sinusitis related to a bacterial infection, your health care provider will prescribe antibiotic medicines. These are medicines that will help kill the bacteria causing the infection. Rarely, sinusitis is caused by a fungal infection. In these cases, your health care provider will prescribe antifungal medicine. For some cases of chronic sinusitis, surgery is needed. Generally, these are cases in which sinusitis recurs more than 3 times per year, despite other treatments. HOME CARE INSTRUCTIONS  Drink plenty of water. Water helps thin the mucus so your sinuses can drain more easily.  Use a humidifier.  Inhale steam 3-4 times a day (for example, sit in the bathroom with the shower running).  Apply a warm, moist washcloth to your face 3-4   times a day, or as directed by your health care provider.  Use saline nasal sprays to help moisten and clean your sinuses.  Take medicines only as directed by your health care provider.  If you were prescribed either an antibiotic or antifungal medicine, finish it all  even if you start to feel better. SEEK IMMEDIATE MEDICAL CARE IF:  You have increasing pain or severe headaches.  You have nausea, vomiting, or drowsiness.  You have swelling around your face.  You have vision problems.  You have a stiff neck.  You have difficulty breathing.   This information is not intended to replace advice given to you by your health care provider. Make sure you discuss any questions you have with your health care provider.   Document Released: 02/12/2005 Document Revised: 03/05/2014 Document Reviewed: 02/27/2011 [employer] Interactive Patient Education 2016 [employer]  

## 2015-08-18 NOTE — Progress Notes (Signed)
   Subjective:  Patient ID: Jillian [name], female    DOB: 06-25-1950  Age: 65 y.o. MRN: [id number]  CC: Sinus infection  HPI:  65 year old female presents with concerns for sinusitis.  Patient reports a one-week history of sinus congestion, right maxillary sinus pain, and right ear pain. She reports associated headache. She's been using over-the-counter nasal saline without resolution in her symptoms. No associated fevers or chills. No reports of purulent nasal discharge. Symptoms are severe. No known exacerbating factors. No other complaints at this time.  Social Hx   Social History   Social History  . Marital Status: Divorced    Spouse Name: N/A  . Number of Children: N/A  . Years of Education: N/A   Occupational History  . cutsodial     works at [employer]  . Smoking status: Former Smoker    Types: Cigarettes    Quit date: 03/16/1999  . Smokeless tobacco: Never Used  . Alcohol Use: No  . Drug Use: No  . Sexual Activity: Not Currently   Other Topics Concern  . None   Social History Narrative   Review of Systems  Constitutional: Negative.   HENT: Positive for congestion and sinus pressure.   Neurological: Positive for headaches.   Objective:  BP 134/72 mmHg  Pulse 67  Temp(Src) 98 F (36.7 C) (Oral)  Ht 5\' 6"  (1.676 m)  Wt 201 lb 6 oz (91.343 kg)  BMI 32.52 kg/m2  SpO2 96%  BP/Weight 08/18/2015 [id number]  Systolic BP [id number]  Diastolic BP 72 78 70  Wt. (Lbs) 201.38 202.5 203  BMI 32.52 32.7 32.78   Physical Exam  Constitutional: She appears well-developed. No distress.  HENT:  Head: Normocephalic and atraumatic.  Mouth/Throat: Oropharynx is clear and moist.  Normal TM's bilaterally. R maxillary sinus tenderness to palpation.  Cardiovascular: Normal rate and regular rhythm.   2/6 systolic murmur.  Pulmonary/Chest: Effort normal. She has no wheezes. She has no rales.  Vitals reviewed.  Lab Results    Component Value Date   WBC WILL FOLLOW 05/30/2015   HGB 13.1 04/18/2015   HCT WILL FOLLOW 05/30/2015   PLT WILL FOLLOW 05/30/2015   GLUCOSE 91 05/30/2015   CHOL 187 05/30/2015   TRIG 240.0* 05/30/2015   HDL 57.50 05/30/2015   LDLDIRECT 85.0 05/30/2015   LDLCALC 93 03/30/2013   ALT 22 05/30/2015   AST 24 05/30/2015   NA 142 05/30/2015   K 4.8 05/30/2015   CL 106 05/30/2015   CREATININE 1.03 05/30/2015   BUN 20 05/30/2015   CO2 27 05/30/2015   TSH 0.68 11/29/2014   Assessment & Plan:   Problem List Items Addressed This Visit    Sinusitis, acute maxillary - Primary    New acute problem. Given duration of symptoms and physical exam, treating empirically with Augmentin.      Relevant Medications   amoxicillin-clavulanate (AUGMENTIN) 875-125 MG tablet     Meds ordered this encounter  Medications  . amoxicillin-clavulanate (AUGMENTIN) 875-125 MG tablet    Sig: Take 1 tablet by mouth 2 (two) times daily.    Dispense:  20 tablet    Refill:  0   Follow-up: PRN  Fritch

## 2015-08-19 ENCOUNTER — Encounter: Payer: Self-pay | Admitting: Internal Medicine

## 2015-08-19 ENCOUNTER — Telehealth: Payer: Self-pay | Admitting: Internal Medicine

## 2015-08-19 NOTE — Telephone Encounter (Signed)
Pt saw Dr. Lacinda Axon yesterday. The medication he prescribed is making pt throw and have diarrhea. Please advise pt.

## 2015-08-19 NOTE — Telephone Encounter (Signed)
Spoke to patient she is having no SX after stopping medication. Patient stated she is back to normal.

## 2015-08-22 ENCOUNTER — Other Ambulatory Visit: Payer: Self-pay | Admitting: Family Medicine

## 2015-08-22 MED ORDER — DOXYCYCLINE HYCLATE 100 MG PO TABS: 100.0000 mg | ORAL_TABLET | Freq: Two times a day (BID) | ORAL | Status: DC

## 2015-08-22 NOTE — Telephone Encounter (Signed)
Is there another medication that can be prescribed?

## 2015-09-24 ENCOUNTER — Other Ambulatory Visit: Payer: Self-pay | Admitting: Internal Medicine

## 2015-09-27 ENCOUNTER — Encounter: Payer: Self-pay | Admitting: Internal Medicine

## 2015-10-05 DIAGNOSIS — Z1283 Encounter for screening for malignant neoplasm of skin: Secondary | ICD-10-CM | POA: Diagnosis not present

## 2015-10-05 DIAGNOSIS — L218 Other seborrheic dermatitis: Secondary | ICD-10-CM | POA: Diagnosis not present

## 2015-10-05 DIAGNOSIS — B078 Other viral warts: Secondary | ICD-10-CM | POA: Diagnosis not present

## 2015-10-05 DIAGNOSIS — L821 Other seborrheic keratosis: Secondary | ICD-10-CM | POA: Diagnosis not present

## 2015-10-05 DIAGNOSIS — Z08 Encounter for follow-up examination after completed treatment for malignant neoplasm: Secondary | ICD-10-CM | POA: Diagnosis not present

## 2015-10-05 DIAGNOSIS — L570 Actinic keratosis: Secondary | ICD-10-CM | POA: Diagnosis not present

## 2015-10-05 DIAGNOSIS — Z85828 Personal history of other malignant neoplasm of skin: Secondary | ICD-10-CM | POA: Diagnosis not present

## 2015-10-07 ENCOUNTER — Encounter: Payer: Self-pay | Admitting: Internal Medicine

## 2015-10-26 DIAGNOSIS — L820 Inflamed seborrheic keratosis: Secondary | ICD-10-CM | POA: Diagnosis not present

## 2015-10-26 DIAGNOSIS — B078 Other viral warts: Secondary | ICD-10-CM | POA: Diagnosis not present

## 2015-10-26 DIAGNOSIS — R208 Other disturbances of skin sensation: Secondary | ICD-10-CM | POA: Diagnosis not present

## 2015-10-26 DIAGNOSIS — L538 Other specified erythematous conditions: Secondary | ICD-10-CM | POA: Diagnosis not present

## 2015-10-26 DIAGNOSIS — R238 Other skin changes: Secondary | ICD-10-CM | POA: Diagnosis not present

## 2015-11-01 ENCOUNTER — Encounter: Payer: Self-pay | Admitting: Family

## 2015-11-01 ENCOUNTER — Ambulatory Visit (INDEPENDENT_AMBULATORY_CARE_PROVIDER_SITE_OTHER): Payer: Medicare Other | Admitting: Family

## 2015-11-01 VITALS — BP 128/74 | HR 75 | Temp 98.2°F | Wt 199.2 lb

## 2015-11-01 DIAGNOSIS — B373 Candidiasis of vulva and vagina: Secondary | ICD-10-CM

## 2015-11-01 DIAGNOSIS — B3731 Acute candidiasis of vulva and vagina: Secondary | ICD-10-CM

## 2015-11-01 MED ORDER — FLUCONAZOLE 150 MG PO TABS: 150.0000 mg | ORAL_TABLET | Freq: Once | ORAL | 1 refills | Status: AC

## 2015-11-01 NOTE — Progress Notes (Signed)
Pre visit review using our clinic review tool, if applicable. No additional management support is needed unless otherwise documented below in the visit note. 

## 2015-11-01 NOTE — Progress Notes (Signed)
Subjective:    Patient ID: Jillian [name], female    DOB: 05-May-1950, 65 y.o.   MRN: [id number]  CC: Jillian [name] is a 65 y.o. female who presents today for an acute visit.    HPI: Patient here for acute visit for vaginal itching and irritation, 'possible yeast infection' for a couple of weeks. Tried OTC monistat ( generic) which gave some relief; only able to use 3 days out of 7 as caused external vaginal burning. Clear to cloudy vaginal discharge which is increased from normal. No vaginal bleeding or foul odor. Normal pap 2014. No dysuria, abdominal pain/distention, flank pain.     HISTORY:  Past Medical History:  Diagnosis Date  . Allergy    seasonal rhinitis  . Arthritis    right shoulder,  no intervention  . Cancer ([place])    basal cell   . GERD (gastroesophageal reflux disease)   . Hyperlipidemia   . Hypertension   . Irritable bowel syndrome (IBS) Oct 2011   last colonoscopy showed diverticulum, no polyps   Past Surgical History:  Procedure Laterality Date  . TONSILLECTOMY AND ADENOIDECTOMY  1956  . TUBAL LIGATION  1977   Family History  Problem Relation Age of Onset  . Hyperlipidemia Mother   . Hyperlipidemia Father   . Hypertension Father   . Hyperlipidemia Maternal Grandmother   . Hyperlipidemia Maternal Grandfather   . Cancer Cousin     Allergies: Levaquin [levofloxacin in d5w]; Sulfa drugs cross reactors; and Tetanus toxoids Current Outpatient Prescriptions on File Prior to Visit  Medication Sig Dispense Refill  . ALPRAZolam (XANAX) 0.25 MG tablet Take 1 tablet (0.25 mg total) by mouth at bedtime as needed for sleep. 30 tablet 2  . amLODipine (NORVASC) 2.5 MG tablet TAKE 1 TABLET DAILY 30 tablet 2  . amoxicillin-clavulanate (AUGMENTIN) 875-125 MG tablet Take 1 tablet by mouth 2 (two) times daily. 20 tablet 0  . aspirin 81 MG tablet Take 81 mg by mouth daily.      . Cholecalciferol (VITAMIN D3) 1000 UNITS CAPS Take 1 capsule by mouth daily.        Marland Kitchen doxycycline (VIBRA-TABS) 100 MG tablet Take 1 tablet (100 mg total) by mouth 2 (two) times daily. 20 tablet 0  . enalapril (VASOTEC) 20 MG tablet TAKE 1 TABLET TWICE DAILY 180 tablet 0  . enalapril (VASOTEC) 20 MG tablet TAKE 1 TABLET TWICE DAILY 180 tablet 1  . fluconazole (DIFLUCAN) 150 MG tablet Take 1 tablet (150 mg total) by mouth daily. 2 tablet 0  . fluticasone (FLONASE) 50 MCG/ACT nasal spray Place 2 sprays into the nose daily. 16 g 5  . HYDROcodone-acetaminophen (NORCO/VICODIN) 5-325 MG tablet Take 1 tablet by mouth every 6 (six) hours as needed for moderate pain. 30 tablet 0  . ibuprofen (ADVIL,MOTRIN) 200 MG tablet Take 400 mg by mouth every 6 (six) hours as needed.      . Lactulose 20 GM/30ML SOLN 30 ml every 4 hours until constipation is relieved 236 mL 3  . loratadine (CLARITIN) 10 MG tablet Take 10 mg by mouth daily.    . magnesium oxide (MAG-OX) 400 MG tablet Take 400 mg by mouth daily.      . Melatonin 5 MG CAPS Take 1 capsule by mouth at bedtime as needed.    . nystatin ointment (MYCOSTATIN) Apply 1 application topically 2 (two) times daily. 30 g 2  . omeprazole (PRILOSEC) 20 MG capsule Take 1 capsule (20 mg total) by mouth  daily as needed. 90 capsule 3  . simvastatin (ZOCOR) 40 MG tablet TAKE 1 TABLET EVERY NIGHT AT BEDTIME 30 tablet 2  . triamcinolone cream (KENALOG) 0.1 % Apply 1 application topically 2 (two) times daily. 30 g 0  . zoster vaccine live, PF, (ZOSTAVAX) 09811 UNT/0.65ML injection Inject 19,400 Units into the skin once. 1 each 0  . zoster vaccine live, PF, (ZOSTAVAX) 91478 UNT/0.65ML injection Inject 19,400 Units into the skin once. 1 each 0   No current facility-administered medications on file prior to visit.     Social History  Substance Use Topics  . Smoking status: Former Smoker    Types: Cigarettes    Quit date: 03/16/1999  . Smokeless tobacco: Never Used  . Alcohol use No    Review of Systems  Constitutional: Negative for chills and fever.   Respiratory: Negative for cough.   Cardiovascular: Negative for chest pain and palpitations.  Gastrointestinal: Negative for nausea and vomiting.  Genitourinary: Negative for enuresis, flank pain, frequency, genital sores and vaginal bleeding.      Objective:    BP 128/74   Pulse 75   Temp 98.2 F (36.8 C) (Oral)   Wt 199 lb 3.2 oz (90.4 kg)   SpO2 95%   BMI 32.15 kg/m    Physical Exam  Constitutional: She appears well-developed and well-nourished.  Eyes: Conjunctivae are normal.  Cardiovascular: Normal rate, regular rhythm, normal heart sounds and normal pulses.   Pulmonary/Chest: Effort normal and breath sounds normal. She has no wheezes. She has no rhonchi. She has no rales.  Abdominal: There is no CVA tenderness.  Genitourinary: There is no rash, tenderness or lesion on the right labia. There is no rash, tenderness or lesion on the left labia. Cervix exhibits no motion tenderness and no discharge. Right adnexum displays no mass, no tenderness and no fullness. Left adnexum displays no mass, no tenderness and no fullness. There is erythema in the vagina. No tenderness or bleeding in the vagina. No foreign body in the vagina. No vaginal discharge found.  Genitourinary Comments: Diffuse vulvovaginal erythema. No lesions. Discharge is thick white , not purulent.   Neurological: She is alert.  Skin: Skin is warm and dry.  Psychiatric: She has a normal mood and affect. Her speech is normal and behavior is normal. Thought content normal.  Vitals reviewed.      Assessment & Plan:   1. Candidal vulvovaginitis Symptoms consistent with vulvovaginal candidiasis. Will treat empirically. Pending culture.  - WET PREP BY MOLECULAR PROBE - fluconazole (DIFLUCAN) 150 MG tablet; Take 1 tablet (150 mg total) by mouth once. Take one tablet PO once. If continue to have symptoms, may take one tablet PO 3 days later.  Dispense: 2 tablet; Refill: 1    I am having [name] maintain her  Vitamin D3, magnesium oxide, ibuprofen, aspirin, omeprazole, loratadine, ALPRAZolam, zoster vaccine live (PF), Melatonin, zoster vaccine live (PF), enalapril, HYDROcodone-acetaminophen, Lactulose, fluticasone, enalapril, nystatin ointment, fluconazole, triamcinolone cream, amoxicillin-clavulanate, doxycycline, simvastatin, and amLODipine.   No orders of the defined types were placed in this encounter.   Return precautions given.   Risks, benefits, and alternatives of the medications and treatment plan prescribed today were discussed, and patient expressed understanding.   Education regarding symptom management and diagnosis given to patient on AVS.  Continue to follow with TULLO, Aris Everts, MD for routine health maintenance.   Jillian [name] and I agreed with plan.   Mable Paris, FNP

## 2015-11-01 NOTE — Patient Instructions (Addendum)
Suspect yeast infection.  Do not take simvastatin while on Diflucan.    If there is no improvement in your symptoms, or if there is any worsening of symptoms, or if you have any additional concerns, please return for re-evaluation; or, if we are closed, consider going to the Emergency Room for evaluation if symptoms urgent.   Monilial Vaginitis Vaginitis in a soreness, swelling and redness (inflammation) of the vagina and vulva. Monilial vaginitis is not a sexually transmitted infection. CAUSES  Yeast vaginitis is caused by yeast (candida) that is normally found in your vagina. With a yeast infection, the candida has overgrown in number to a point that upsets the chemical balance. SYMPTOMS   White, thick vaginal discharge.  Swelling, itching, redness and irritation of the vagina and possibly the lips of the vagina (vulva).  Burning or painful urination.  Painful intercourse. DIAGNOSIS  Things that may contribute to monilial vaginitis are:  Postmenopausal and virginal states.  Pregnancy.  Infections.  Being tired, sick or stressed, especially if you had monilial vaginitis in the past.  Diabetes. Good control will help lower the chance.  Birth control pills.  Tight fitting garments.  Using bubble bath, feminine sprays, douches or deodorant tampons.  Taking certain medications that kill germs (antibiotics).  Sporadic recurrence can occur if you become ill. TREATMENT  Your caregiver will give you medication.  There are several kinds of anti monilial vaginal creams and suppositories specific for monilial vaginitis. For recurrent yeast infections, use a suppository or cream in the vagina 2 times a week, or as directed.  Anti-monilial or steroid cream for the itching or irritation of the vulva may also be used. Get your caregiver's permission.  Painting the vagina with methylene blue solution may help if the monilial cream does not work.  Eating yogurt may help prevent  monilial vaginitis. HOME CARE INSTRUCTIONS   Finish all medication as prescribed.  Do not have sex until treatment is completed or after your caregiver tells you it is okay.  Take warm sitz baths.  Do not douche.  Do not use tampons, especially scented ones.  Wear cotton underwear.  Avoid tight pants and panty hose.  Tell your sexual partner that you have a yeast infection. They should go to their caregiver if they have symptoms such as mild rash or itching.  Your sexual partner should be treated as well if your infection is difficult to eliminate.  Practice safer sex. Use condoms.  Some vaginal medications cause latex condoms to fail. Vaginal medications that harm condoms are:  Cleocin cream.  Butoconazole (Femstat).  Terconazole (Terazol) vaginal suppository.  Miconazole (Monistat) (may be purchased over the counter). SEEK MEDICAL CARE IF:   You have a temperature by mouth above 102 F (38.9 C).  The infection is getting worse after 2 days of treatment.  The infection is not getting better after 3 days of treatment.  You develop blisters in or around your vagina.  You develop vaginal bleeding, and it is not your menstrual period.  You have pain when you urinate.  You develop intestinal problems.  You have pain with sexual intercourse.   This information is not intended to replace advice given to you by your health care provider. Make sure you discuss any questions you have with your health care provider.   Document Released: 11/22/2004 Document Revised: 05/07/2011 Document Reviewed: 08/16/2014 Elsevier Interactive Patient Education [employer].

## 2015-11-02 LAB — WET PREP BY MOLECULAR PROBE
Candida species: NEGATIVE
GARDNERELLA VAGINALIS: NEGATIVE
TRICHOMONAS VAG: NEGATIVE

## 2015-11-09 ENCOUNTER — Encounter: Payer: Self-pay | Admitting: Internal Medicine

## 2015-11-09 ENCOUNTER — Ambulatory Visit (INDEPENDENT_AMBULATORY_CARE_PROVIDER_SITE_OTHER): Payer: Medicare Other | Admitting: Internal Medicine

## 2015-11-09 DIAGNOSIS — J0100 Acute maxillary sinusitis, unspecified: Secondary | ICD-10-CM

## 2015-11-09 DIAGNOSIS — N760 Acute vaginitis: Secondary | ICD-10-CM

## 2015-11-09 MED ORDER — PREDNISONE 10 MG PO TABS: ORAL_TABLET | ORAL | 0 refills | Status: DC

## 2015-11-09 MED ORDER — AMOXICILLIN 500 MG PO CAPS: 500.0000 mg | ORAL_CAPSULE | Freq: Three times a day (TID) | ORAL | 0 refills | Status: DC

## 2015-11-09 NOTE — Progress Notes (Signed)
Pre visit review using our clinic review tool, if applicable. No additional management support is needed unless otherwise documented below in the visit note. 

## 2015-11-09 NOTE — Progress Notes (Signed)
Subjective:  Patient ID: Jillian [name], female    DOB: Jun 21, 1950  Age: 65 y.o. MRN: [id number]  CC: Diagnoses of Acute maxillary sinusitis, recurrence not specified and Vaginitis were pertinent to this visit.  HPI Jillian [name] presents for follow up on  symptoms  Of persistent/recurrent vaginitis.  She was  treated in May with monistat for presumed candida, could not finish course,  Then wav given fluconazole on Sept 5 by Arnette for pelvic exam consistent with yeast .  Wet prep was negative for yeast ,  Trich and gardnerella  NO discharge today .  Had changed toilet paper a month ago, when symptoms started.  .  Sinus drainage and congestion with cough productive of purulent sputum and headaches for the last 7 days.  Patient  has used multiple OTD decongestants,  Sinus rinses,  advil and tylenol along with otc cough suppressants without improvement.     Outpatient Medications Prior to Visit  Medication Sig Dispense Refill  . amLODipine (NORVASC) 2.5 MG tablet TAKE 1 TABLET DAILY 30 tablet 2  . aspirin 81 MG tablet Take 81 mg by mouth daily.      . Cholecalciferol (VITAMIN D3) 1000 UNITS CAPS Take 1 capsule by mouth daily.      . enalapril (VASOTEC) 20 MG tablet TAKE 1 TABLET TWICE DAILY 180 tablet 0  . enalapril (VASOTEC) 20 MG tablet TAKE 1 TABLET TWICE DAILY 180 tablet 1  . fluticasone (FLONASE) 50 MCG/ACT nasal spray Place 2 sprays into the nose daily. 16 g 5  . ibuprofen (ADVIL,MOTRIN) 200 MG tablet Take 400 mg by mouth every 6 (six) hours as needed.      . loratadine (CLARITIN) 10 MG tablet Take 10 mg by mouth daily.    . magnesium oxide (MAG-OX) 400 MG tablet Take 400 mg by mouth daily.      . Melatonin 5 MG CAPS Take 1 capsule by mouth at bedtime as needed.    Marland Kitchen omeprazole (PRILOSEC) 20 MG capsule Take 1 capsule (20 mg total) by mouth daily as needed. 90 capsule 3  . simvastatin (ZOCOR) 40 MG tablet TAKE 1 TABLET EVERY NIGHT AT BEDTIME 30 tablet 2  . zoster vaccine  live, PF, (ZOSTAVAX) 09811 UNT/0.65ML injection Inject 19,400 Units into the skin once. 1 each 0  . zoster vaccine live, PF, (ZOSTAVAX) 91478 UNT/0.65ML injection Inject 19,400 Units into the skin once. 1 each 0  . ALPRAZolam (XANAX) 0.25 MG tablet Take 1 tablet (0.25 mg total) by mouth at bedtime as needed for sleep. (Patient not taking: Reported on 11/09/2015) 30 tablet 2  . HYDROcodone-acetaminophen (NORCO/VICODIN) 5-325 MG tablet Take 1 tablet by mouth every 6 (six) hours as needed for moderate pain. (Patient not taking: Reported on 11/09/2015) 30 tablet 0  . amoxicillin-clavulanate (AUGMENTIN) 875-125 MG tablet Take 1 tablet by mouth 2 (two) times daily. 20 tablet 0  . doxycycline (VIBRA-TABS) 100 MG tablet Take 1 tablet (100 mg total) by mouth 2 (two) times daily. 20 tablet 0  . Lactulose 20 GM/30ML SOLN 30 ml every 4 hours until constipation is relieved 236 mL 3  . nystatin ointment (MYCOSTATIN) Apply 1 application topically 2 (two) times daily. 30 g 2  . triamcinolone cream (KENALOG) 0.1 % Apply 1 application topically 2 (two) times daily. 30 g 0   No facility-administered medications prior to visit.     Review of Systems;  Patient denies  unintentional weight loss, skin rash, eye pain,, dysphagia,  hemoptysis ,  dyspnea, wheezing, chest pain, palpitations, orthopnea, edema, abdominal pain, nausea, melena, diarrhea, constipation, flank pain, dysuria, hematuria, urinary  Frequency, nocturia, numbness, tingling, seizures,  Focal weakness, Loss of consciousness,  Tremor, insomnia, depression, anxiety, and suicidal ideation.      Objective:  BP 118/64 (BP Location: Right Arm, Patient Position: Sitting, Cuff Size: Large)   Pulse 75   Temp 98.3 F (36.8 C) (Oral)   Resp 16   Ht 5\' 6"  (1.676 m)   Wt 199 lb (90.3 kg)   SpO2 98%   BMI 32.12 kg/m   BP Readings from Last 3 Encounters:  11/09/15 118/64  11/01/15 128/74  08/18/15 134/72    Wt Readings from Last 3 Encounters:  11/09/15  199 lb (90.3 kg)  11/01/15 199 lb 3.2 oz (90.4 kg)  08/18/15 201 lb 6 oz (91.3 kg)    HEENT: Head: Normocephalic, without obvious abnormality, atraumatic, sinuses tender to percussion Eyes: conjunctivae/corneas clear. PERRL, EOM's intact. Fundi benign. Ears: normal TM's and external ear canals both ears Nose: Nares normal. Septum midline. Mucosa injected and red . Maxillary  sinus tenderness bilateral, no crusting or bleeding points Throat: lips, mucosa, and tongue normal; teeth and gums normal Neck: no adenopathy, no carotid bruit, supple, symmetrical, trachea midline and thyroid not enlarged, symmetric, no tenderness/mass/nodules Back: symmetric, no curvature. ROM normal. No CVA tenderness. Lungs: clear to auscultation bilaterally Heart: regular rate and rhythm, S1, S2 normal, no murmur, click, rub or gallop Abdomen: soft, non-tender; bowel sounds normal; no masses,  no organomegaly Pulses: 2+ and symmetric Skin: Skin color, texture, turgor normal. No rashes or lesions Lymph nodes: Cervical, supraclavicular, and axillary nodes normal.  No results found for: HGBA1C  Lab Results  Component Value Date   CREATININE 1.03 05/30/2015   CREATININE 1.07 11/29/2014   CREATININE 1.00 08/02/2014    Lab Results  Component Value Date   WBC WILL FOLLOW 05/30/2015   HGB 13.1 04/18/2015   HCT WILL FOLLOW 05/30/2015   PLT WILL FOLLOW 05/30/2015   GLUCOSE 91 05/30/2015   CHOL 187 05/30/2015   TRIG 240.0 (H) 05/30/2015   HDL 57.50 05/30/2015   LDLDIRECT 85.0 05/30/2015   LDLCALC 93 03/30/2013   ALT 22 05/30/2015   AST 24 05/30/2015   NA 142 05/30/2015   K 4.8 05/30/2015   CL 106 05/30/2015   CREATININE 1.03 05/30/2015   BUN 20 05/30/2015   CO2 27 05/30/2015   TSH 0.68 11/29/2014    Assessment & Plan:   Problem List Items Addressed This Visit    Sinusitis, acute maxillary    Given chronicity of symptoms, development of facial pain and exam consistent with bacterial URI,  Will  treat with empiric antibiotics, decongestants, and saline lavage.        Relevant Medications   amoxicillin (AMOXIL) 500 MG capsule   predniSONE (DELTASONE) 10 MG tablet   Vaginitis    Recent wet prep was normal , Symptoms resolved with change in toilet paper.        Other Visit Diagnoses   None.     I have discontinued Ms. Mogel's Lactulose, nystatin ointment, triamcinolone cream, amoxicillin-clavulanate, and doxycycline. I am also having her start on amoxicillin and predniSONE. Additionally, I am having her maintain her Vitamin D3, magnesium oxide, ibuprofen, aspirin, omeprazole, loratadine, ALPRAZolam, zoster vaccine live (PF), Melatonin, zoster vaccine live (PF), enalapril, HYDROcodone-acetaminophen, fluticasone, enalapril, simvastatin, and amLODipine.  Meds ordered this encounter  Medications  . amoxicillin (AMOXIL) 500 MG capsule    Sig:  Take 1 capsule (500 mg total) by mouth 3 (three) times daily.    Dispense:  30 capsule    Refill:  0  . predniSONE (DELTASONE) 10 MG tablet    Sig: 6 tablets on Day 1 , then reduce by 1 tablet daily until gone    Dispense:  21 tablet    Refill:  0    Medications Discontinued During This Encounter  Medication Reason  . amoxicillin-clavulanate (AUGMENTIN) 875-125 MG tablet Completed Course  . doxycycline (VIBRA-TABS) 100 MG tablet Completed Course  . Lactulose 20 GM/30ML SOLN Discontinued by provider  . nystatin ointment (MYCOSTATIN) Completed Course  . triamcinolone cream (KENALOG) 0.1 % Completed Course    Follow-up: No Follow-up on file.   Crecencio Mc, MD

## 2015-11-09 NOTE — Patient Instructions (Signed)
I am treating you for sinusitis/otitis which is a complication frompersistent sinus congestion.   I am prescribing an antibiotic (amoxicillin 500 MG THREE TIMES DAILY )  and a prednisone taper  To manage the infection and the inflammation in your ear/sinuses.   I also advise use of the following OTC meds to help with your other symptoms.   Take generic OTC benadryl 25 mg every 8 hours for the drainage,  Sudafed PE  10 to 30 mg every 8 hours for the congestion, you may substitute Afrin nasal spray for the nighttime dose of sudafed PE  If needed to prevent insomnia.  flush your sinuses twice daily with Simply Saline (do over the sink because if you do it right you will spit out globs of mucus)  Use benzonatate capsules   FOR THE COUGH.  Gargle with salt water as needed for sore throat.   Please take a probiotic ( Align, Floraque or Culturelle), or the generic version of one of these over the counter medications, for a minimum of 3 weeks to prevent a serious antibiotic associated diarrhea  Called clostridium dificile colitis.  Taking a probiotic may also prevent vaginitis due to yeast infections and can be continued indefinitely if you feel that it improves your digestion or your elimination (bowels).

## 2015-11-11 DIAGNOSIS — N760 Acute vaginitis: Secondary | ICD-10-CM | POA: Insufficient documentation

## 2015-11-11 NOTE — Assessment & Plan Note (Signed)
Given chronicity of symptoms, development of facial pain and exam consistent with bacterial URI,  Will treat with empiric antibiotics, decongestants, and saline lavage.   

## 2015-11-11 NOTE — Assessment & Plan Note (Signed)
Recent wet prep was normal , Symptoms resolved with change in toilet paper.

## 2015-11-13 NOTE — Assessment & Plan Note (Addendum)
Advised to try takiing Lactaise  Or eliminate dairy from diet.

## 2015-11-13 NOTE — Assessment & Plan Note (Addendum)
Celiac disease screen was negative.  Considering IBS as cause of patient's GI issues.  IBS handout given

## 2015-11-23 ENCOUNTER — Encounter: Payer: Self-pay | Admitting: *Deleted

## 2015-11-23 ENCOUNTER — Ambulatory Visit: Payer: Medicare Other | Admitting: Family

## 2015-11-25 ENCOUNTER — Ambulatory Visit (INDEPENDENT_AMBULATORY_CARE_PROVIDER_SITE_OTHER): Payer: Medicare Other | Admitting: Family

## 2015-11-25 ENCOUNTER — Encounter: Payer: Self-pay | Admitting: Family

## 2015-11-25 VITALS — BP 126/78 | HR 73 | Temp 97.8°F | Wt 198.8 lb

## 2015-11-25 DIAGNOSIS — B379 Candidiasis, unspecified: Secondary | ICD-10-CM

## 2015-11-25 DIAGNOSIS — H6981 Other specified disorders of Eustachian tube, right ear: Secondary | ICD-10-CM | POA: Diagnosis not present

## 2015-11-25 DIAGNOSIS — R0981 Nasal congestion: Secondary | ICD-10-CM

## 2015-11-25 DIAGNOSIS — T3695XA Adverse effect of unspecified systemic antibiotic, initial encounter: Secondary | ICD-10-CM

## 2015-11-25 MED ORDER — PREDNISONE 10 MG PO TABS: ORAL_TABLET | ORAL | 0 refills | Status: DC

## 2015-11-25 MED ORDER — FLUCONAZOLE 150 MG PO TABS: 150.0000 mg | ORAL_TABLET | Freq: Once | ORAL | 1 refills | Status: AC

## 2015-11-25 NOTE — Assessment & Plan Note (Addendum)
Patient had modest improvement after antibiotic and prednisone use. She still endorses right side sinus pressure and ear pressure which is causing dull HA. No fever, exquisite tenderness. Patient and I jointly decided on another trial of prednisone for suspected eustachian tube dysfunction. Return precautions given.

## 2015-11-25 NOTE — Progress Notes (Signed)
Subjective:    Patient ID: Jillian [name], female    DOB: October 25, 1950, 65 y.o.   MRN: [id number]  CC: MAC DARRIN is a 65 y.o. female who presents today for an acute visit.    HPI: She is here for an acute visit for reevaluation of acute sinusitis for 2 weeks. She was initially seen by PCP 9/13. She was treated with amoxicillin and prednisone 6 day taper at that time with some relief. Has dull HA with pressure of sinus, has been using Sudafed, hot rags, aleve.  Main complaint is 'head pressure' and 'burning in sinuses' although noted it is better. Endorses right ear pain.No discharge.   No fever, chills, sore throat, cough, nasal congestion, no changes in vision.   Seasonal allergies.     HISTORY:  Past Medical History:  Diagnosis Date  . Allergy    seasonal rhinitis  . Arthritis    right shoulder,  no intervention  . Cancer ([place])    basal cell   . GERD (gastroesophageal reflux disease)   . Hyperlipidemia   . Hypertension   . Irritable bowel syndrome (IBS) Oct 2011   last colonoscopy showed diverticulum, no polyps   Past Surgical History:  Procedure Laterality Date  . TONSILLECTOMY AND ADENOIDECTOMY  1956  . TUBAL LIGATION  1977   Family History  Problem Relation Age of Onset  . Hyperlipidemia Mother   . Hyperlipidemia Father   . Hypertension Father   . Hyperlipidemia Maternal Grandmother   . Hyperlipidemia Maternal Grandfather   . Cancer Cousin     Allergies: Doxycycline; Levaquin [levofloxacin in d5w]; Sulfa drugs cross reactors; and Tetanus toxoids Current Outpatient Prescriptions on File Prior to Visit  Medication Sig Dispense Refill  . ALPRAZolam (XANAX) 0.25 MG tablet Take 1 tablet (0.25 mg total) by mouth at bedtime as needed for sleep. (Patient not taking: Reported on 11/09/2015) 30 tablet 2  . amLODipine (NORVASC) 2.5 MG tablet TAKE 1 TABLET DAILY 30 tablet 2  . aspirin 81 MG tablet Take 81 mg by mouth daily.      . Cholecalciferol (VITAMIN  D3) 1000 UNITS CAPS Take 1 capsule by mouth daily.      . enalapril (VASOTEC) 20 MG tablet TAKE 1 TABLET TWICE DAILY 180 tablet 0  . enalapril (VASOTEC) 20 MG tablet TAKE 1 TABLET TWICE DAILY 180 tablet 1  . fluticasone (FLONASE) 50 MCG/ACT nasal spray Place 2 sprays into the nose daily. 16 g 5  . HYDROcodone-acetaminophen (NORCO/VICODIN) 5-325 MG tablet Take 1 tablet by mouth every 6 (six) hours as needed for moderate pain. (Patient not taking: Reported on 11/09/2015) 30 tablet 0  . ibuprofen (ADVIL,MOTRIN) 200 MG tablet Take 400 mg by mouth every 6 (six) hours as needed.      . loratadine (CLARITIN) 10 MG tablet Take 10 mg by mouth daily.    . magnesium oxide (MAG-OX) 400 MG tablet Take 400 mg by mouth daily.      . Melatonin 5 MG CAPS Take 1 capsule by mouth at bedtime as needed.    Marland Kitchen omeprazole (PRILOSEC) 20 MG capsule Take 1 capsule (20 mg total) by mouth daily as needed. 90 capsule 3  . simvastatin (ZOCOR) 40 MG tablet TAKE 1 TABLET EVERY NIGHT AT BEDTIME 30 tablet 2  . zoster vaccine live, PF, (ZOSTAVAX) 60454 UNT/0.65ML injection Inject 19,400 Units into the skin once. 1 each 0  . zoster vaccine live, PF, (ZOSTAVAX) 09811 UNT/0.65ML injection Inject 19,400 Units into the  skin once. 1 each 0   No current facility-administered medications on file prior to visit.     Social History  Substance Use Topics  . Smoking status: Former Smoker    Types: Cigarettes    Quit date: 03/16/1999  . Smokeless tobacco: Never Used  . Alcohol use No    Review of Systems  Constitutional: Negative for chills and fever.  HENT: Positive for congestion, ear pain (right) and sinus pressure (right). Negative for rhinorrhea and sore throat.   Eyes: Negative for visual disturbance.  Respiratory: Negative for cough, shortness of breath and wheezing.   Cardiovascular: Negative for chest pain and palpitations.  Gastrointestinal: Negative for nausea and vomiting.  Neurological: Positive for headaches. Negative  for dizziness and syncope.      Objective:    BP 126/78   Pulse 73   Temp 97.8 F (36.6 C) (Oral)   Wt 198 lb 12.8 oz (90.2 kg)   SpO2 97%   BMI 32.09 kg/m    Physical Exam  Constitutional: She appears well-developed and well-nourished.  HENT:  Head: Normocephalic and atraumatic.  Right Ear: Hearing, external ear and ear canal normal. No drainage, swelling or tenderness. No foreign bodies. Tympanic membrane is erythematous (mild erythema). Tympanic membrane is not bulging. No middle ear effusion. No decreased hearing is noted.  Left Ear: Hearing, tympanic membrane, external ear and ear canal normal. No drainage, swelling or tenderness. No foreign bodies. Tympanic membrane is not erythematous and not bulging.  No middle ear effusion. No decreased hearing is noted.  Nose: Nose normal. No rhinorrhea. Right sinus exhibits no maxillary sinus tenderness and no frontal sinus tenderness. Left sinus exhibits no maxillary sinus tenderness and no frontal sinus tenderness.  Mouth/Throat: Uvula is midline, oropharynx is clear and moist and mucous membranes are normal. No oropharyngeal exudate, posterior oropharyngeal edema, posterior oropharyngeal erythema or tonsillar abscesses.  Eyes: Conjunctivae are normal.  Cardiovascular: Regular rhythm, normal heart sounds and normal pulses.   Pulmonary/Chest: Effort normal and breath sounds normal. She has no wheezes. She has no rhonchi. She has no rales.  Lymphadenopathy:       Head (right side): No submental, no submandibular, no tonsillar, no preauricular, no posterior auricular and no occipital adenopathy present.       Head (left side): No submental, no submandibular, no tonsillar, no preauricular, no posterior auricular and no occipital adenopathy present.    She has no cervical adenopathy.  Neurological: She is alert.  Skin: Skin is warm and dry.  Psychiatric: She has a normal mood and affect. Her speech is normal and behavior is normal. Thought  content normal.  Vitals reviewed.      Assessment & Plan:   Problem List Items Addressed This Visit      Nervous and Auditory   Eustachian tube dysfunction    Patient had modest improvement after antibiotic and prednisone use. She still endorses right side sinus pressure and ear pressure which is causing dull HA. No fever, exquisite tenderness. Patient and I jointly decided on another trial of prednisone for suspected eustachian tube dysfunction. Return precautions given.        Other Visit Diagnoses    Sinus congestion    -  Primary   Relevant Medications   predniSONE (DELTASONE) 10 MG tablet   Antibiotic-induced yeast infection       Relevant Medications   fluconazole (DIFLUCAN) 150 MG tablet        I have discontinued Ms. Christopoulos's amoxicillin and predniSONE. I  am also having her start on predniSONE and fluconazole. Additionally, I am having her maintain her Vitamin D3, magnesium oxide, ibuprofen, aspirin, omeprazole, loratadine, ALPRAZolam, zoster vaccine live (PF), Melatonin, zoster vaccine live (PF), enalapril, HYDROcodone-acetaminophen, fluticasone, enalapril, simvastatin, and amLODipine.   Meds ordered this encounter  Medications  . predniSONE (DELTASONE) 10 MG tablet    Sig: Take 4 tablets ( total 40 mg) by mouth for 2 days; take 3 tablets ( total 30 mg) by mouth for 2 days; take 2 tablets ( total 20 mg) by mouth for 1 day; take 1 tablet ( total 10 mg) by mouth for 1 day.    Dispense:  17 tablet    Refill:  0    Order Specific Question:   Supervising Provider    Answer:   Deborra Medina L [[id number]]  . fluconazole (DIFLUCAN) 150 MG tablet    Sig: Take 1 tablet (150 mg total) by mouth once. Take one tablet PO once. If continue to have symptoms, may take one tablet PO 3 days later.    Dispense:  2 tablet    Refill:  1    Order Specific Question:   Supervising Provider    Answer:   Crecencio Mc [[id number]]    Return precautions given.   Risks, benefits, and  alternatives of the medications and treatment plan prescribed today were discussed, and patient expressed understanding.   Education regarding symptom management and diagnosis given to patient on AVS.  Continue to follow with TULLO, Aris Everts, MD for routine health maintenance.   Jillian [name] and I agreed with plan.   Mable Paris, FNP

## 2015-11-25 NOTE — Patient Instructions (Signed)

## 2015-11-25 NOTE — Progress Notes (Signed)
Pre visit review using our clinic review tool, if applicable. No additional management support is needed unless otherwise documented below in the visit note. 

## 2015-11-30 ENCOUNTER — Ambulatory Visit: Payer: Medicare Other | Admitting: Internal Medicine

## 2015-12-19 ENCOUNTER — Other Ambulatory Visit: Payer: Self-pay | Admitting: Internal Medicine

## 2015-12-19 DIAGNOSIS — Z1231 Encounter for screening mammogram for malignant neoplasm of breast: Secondary | ICD-10-CM

## 2015-12-20 DIAGNOSIS — Z789 Other specified health status: Secondary | ICD-10-CM | POA: Diagnosis not present

## 2015-12-20 DIAGNOSIS — R208 Other disturbances of skin sensation: Secondary | ICD-10-CM | POA: Diagnosis not present

## 2015-12-20 DIAGNOSIS — L538 Other specified erythematous conditions: Secondary | ICD-10-CM | POA: Diagnosis not present

## 2015-12-20 DIAGNOSIS — B078 Other viral warts: Secondary | ICD-10-CM | POA: Diagnosis not present

## 2015-12-22 ENCOUNTER — Other Ambulatory Visit: Payer: Self-pay | Admitting: Internal Medicine

## 2015-12-27 ENCOUNTER — Ambulatory Visit (INDEPENDENT_AMBULATORY_CARE_PROVIDER_SITE_OTHER): Payer: Medicare Other | Admitting: Internal Medicine

## 2015-12-27 ENCOUNTER — Other Ambulatory Visit (HOSPITAL_COMMUNITY)
Admission: RE | Admit: 2015-12-27 | Discharge: 2015-12-27 | Disposition: A | Discharge status: Home / Self Care | Payer: Medicare Other | Source: Ambulatory Visit | Attending: Internal Medicine | Admitting: Internal Medicine

## 2015-12-27 VITALS — BP 118/72 | HR 65 | Temp 98.3°F | Resp 12 | Ht 66.0 in | Wt 200.0 lb

## 2015-12-27 DIAGNOSIS — Z124 Encounter for screening for malignant neoplasm of cervix: Secondary | ICD-10-CM | POA: Diagnosis not present

## 2015-12-27 DIAGNOSIS — Z01419 Encounter for gynecological examination (general) (routine) without abnormal findings: Secondary | ICD-10-CM | POA: Diagnosis not present

## 2015-12-27 DIAGNOSIS — Z1151 Encounter for screening for human papillomavirus (HPV): Secondary | ICD-10-CM | POA: Insufficient documentation

## 2015-12-27 DIAGNOSIS — E559 Vitamin D deficiency, unspecified: Secondary | ICD-10-CM | POA: Diagnosis not present

## 2015-12-27 DIAGNOSIS — Z23 Encounter for immunization: Secondary | ICD-10-CM | POA: Diagnosis not present

## 2015-12-27 DIAGNOSIS — R5383 Other fatigue: Secondary | ICD-10-CM

## 2015-12-27 DIAGNOSIS — N898 Other specified noninflammatory disorders of vagina: Secondary | ICD-10-CM | POA: Diagnosis not present

## 2015-12-27 DIAGNOSIS — Z01411 Encounter for gynecological examination (general) (routine) with abnormal findings: Secondary | ICD-10-CM | POA: Insufficient documentation

## 2015-12-27 DIAGNOSIS — N761 Subacute and chronic vaginitis: Secondary | ICD-10-CM

## 2015-12-27 DIAGNOSIS — Z0000 Encounter for general adult medical examination without abnormal findings: Secondary | ICD-10-CM

## 2015-12-27 DIAGNOSIS — Z113 Encounter for screening for infections with a predominantly sexual mode of transmission: Secondary | ICD-10-CM | POA: Insufficient documentation

## 2015-12-27 DIAGNOSIS — E782 Mixed hyperlipidemia: Secondary | ICD-10-CM | POA: Diagnosis not present

## 2015-12-27 DIAGNOSIS — I10 Essential (primary) hypertension: Secondary | ICD-10-CM | POA: Diagnosis not present

## 2015-12-27 LAB — COMPREHENSIVE METABOLIC PANEL
ALBUMIN: 4.3 g/dL (ref 3.5–5.2)
ALK PHOS: 58 U/L (ref 39–117)
ALT: 13 U/L (ref 0–35)
AST: 17 U/L (ref 0–37)
BUN: 19 mg/dL (ref 6–23)
CO2: 28 mEq/L (ref 19–32)
CREATININE: 1.12 mg/dL (ref 0.40–1.20)
Calcium: 9.8 mg/dL (ref 8.4–10.5)
Chloride: 106 mEq/L (ref 96–112)
GFR: 51.67 mL/min — ABNORMAL LOW (ref >60.00)
GLUCOSE: 87 mg/dL (ref 70–99)
Potassium: 4.9 mEq/L (ref 3.5–5.1)
SODIUM: 142 meq/L (ref 135–145)
TOTAL PROTEIN: 6.7 g/dL (ref 6.0–8.3)
Total Bilirubin: 0.4 mg/dL (ref 0.2–1.2)

## 2015-12-27 LAB — LIPID PANEL
CHOL/HDL RATIO: 3
Cholesterol: 169 mg/dL (ref 0–200)
HDL: 55.1 mg/dL (ref >39.00)
LDL CALC: 83 mg/dL (ref 0–99)
NONHDL: 113.72
TRIGLYCERIDES: 154 mg/dL — AB (ref 0.0–149.0)
VLDL: 30.8 mg/dL (ref 0.0–40.0)

## 2015-12-27 LAB — CBC WITH DIFFERENTIAL/PLATELET
BASOS PCT: 0.3 % (ref 0.0–3.0)
Basophils Absolute: 0 10*3/uL (ref 0.0–0.1)
EOS ABS: 0.1 10*3/uL (ref 0.0–0.7)
Eosinophils Relative: 0.9 % (ref 0.0–5.0)
HEMATOCRIT: 37.2 % (ref 36.0–46.0)
Hemoglobin: 12.7 g/dL (ref 12.0–15.0)
LYMPHS ABS: 2.8 10*3/uL (ref 0.7–4.0)
LYMPHS PCT: 32 % (ref 12.0–46.0)
MCHC: 34 g/dL (ref 30.0–36.0)
MCV: 87.3 fl (ref 78.0–100.0)
Monocytes Absolute: 0.6 10*3/uL (ref 0.1–1.0)
Monocytes Relative: 7.2 % (ref 3.0–12.0)
NEUTROS ABS: 5.2 10*3/uL (ref 1.4–7.7)
NEUTROS PCT: 59.6 % (ref 43.0–77.0)
PLATELETS: 247 10*3/uL (ref 150.0–400.0)
RBC: 4.26 Mil/uL (ref 3.87–5.11)
RDW: 14.2 % (ref 11.5–15.5)
WBC: 8.7 10*3/uL (ref 4.0–10.5)

## 2015-12-27 LAB — VITAMIN D 25 HYDROXY (VIT D DEFICIENCY, FRACTURES): VITD: 32.51 ng/mL (ref 30.00–100.00)

## 2015-12-27 LAB — TSH: TSH: 1.37 u[IU]/mL (ref 0.35–4.50)

## 2015-12-27 MED ORDER — AMLODIPINE BESYLATE 2.5 MG PO TABS: 2.5000 mg | ORAL_TABLET | Freq: Every day | ORAL | 2 refills | Status: DC

## 2015-12-27 NOTE — Progress Notes (Signed)
Pre-visit discussion using our clinic review tool. No additional management support is needed unless otherwise documented below in the visit note.  

## 2015-12-27 NOTE — Patient Instructions (Signed)
I am repeating tests for vaginitis today.  If they are negative,  I propose that we try vaginal estrogen for 3 months to see if it helps your irritation  Menopause is a normal process in which your reproductive ability comes to an end. This process happens gradually over a span of months to years, usually between the ages of 46 and 71. Menopause is complete when you have missed 12 consecutive menstrual periods. It is important to talk with your health care provider about some of the most common conditions that affect postmenopausal women, such as heart disease, cancer, and bone loss (osteoporosis). Adopting a healthy lifestyle and getting preventive care can help to promote your health and wellness. Those actions can also lower your chances of developing some of these common conditions. WHAT SHOULD I KNOW ABOUT MENOPAUSE? During menopause, you may experience a number of symptoms, such as:  Moderate-to-severe hot flashes.  Night sweats.  Decrease in sex drive.  Mood swings.  Headaches.  Tiredness.  Irritability.  Memory problems.  Insomnia. Choosing to treat or not to treat menopausal changes is an individual decision that you make with your health care provider. WHAT SHOULD I KNOW ABOUT HORMONE REPLACEMENT THERAPY AND SUPPLEMENTS? Hormone therapy products are effective for treating symptoms that are associated with menopause, such as hot flashes and night sweats. Hormone replacement carries certain risks, especially as you become older. If you are thinking about using estrogen or estrogen with progestin treatments, discuss the benefits and risks with your health care provider. WHAT SHOULD I KNOW ABOUT HEART DISEASE AND STROKE? Heart disease, heart attack, and stroke become more likely as you age. This may be due, in part, to the hormonal changes that your body experiences during menopause. These can affect how your body processes dietary fats, triglycerides, and cholesterol. Heart attack  and stroke are both medical emergencies. There are many things that you can do to help prevent heart disease and stroke:  Have your blood pressure checked at least every 1-2 years. High blood pressure causes heart disease and increases the risk of stroke.  If you are 35-41 years old, ask your health care provider if you should take aspirin to prevent a heart attack or a stroke.  Do not use any tobacco products, including cigarettes, chewing tobacco, or electronic cigarettes. If you need help quitting, ask your health care provider.  It is important to eat a healthy diet and maintain a healthy weight.  Be sure to include plenty of vegetables, fruits, low-fat dairy products, and lean protein.  Avoid eating foods that are high in solid fats, added sugars, or salt (sodium).  Get regular exercise. This is one of the most important things that you can do for your health.  Try to exercise for at least 150 minutes each week. The type of exercise that you do should increase your heart rate and make you sweat. This is known as moderate-intensity exercise.  Try to do strengthening exercises at least twice each week. Do these in addition to the moderate-intensity exercise.  Know your numbers.Ask your health care provider to check your cholesterol and your blood glucose. Continue to have your blood tested as directed by your health care provider. WHAT SHOULD I KNOW ABOUT CANCER SCREENING? There are several types of cancer. Take the following steps to reduce your risk and to catch any cancer development as early as possible. Breast Cancer  Practice breast self-awareness.  This means understanding how your breasts normally appear and feel.  It  also means doing regular breast self-exams. Let your health care provider know about any changes, no matter how small.  If you are 71 or older, have a clinician do a breast exam (clinical breast exam or CBE) every year. Depending on your age, family history,  and medical history, it may be recommended that you also have a yearly breast X-ray (mammogram).  If you have a family history of breast cancer, talk with your health care provider about genetic screening.  If you are at high risk for breast cancer, talk with your health care provider about having an MRI and a mammogram every year.  Breast cancer (BRCA) gene test is recommended for women who have family members with BRCA-related cancers. Results of the assessment will determine the need for genetic counseling and BRCA1 and for BRCA2 testing. BRCA-related cancers include these types:  Breast. This occurs in males or females.  Ovarian.  Tubal. This may also be called fallopian tube cancer.  Cancer of the abdominal or pelvic lining (peritoneal cancer).  Prostate.  Pancreatic. Cervical, Uterine, and Ovarian Cancer Your health care provider may recommend that you be screened regularly for cancer of the pelvic organs. These include your ovaries, uterus, and vagina. This screening involves a pelvic exam, which includes checking for microscopic changes to the surface of your cervix (Pap test).  For women ages 21-65, health care providers may recommend a pelvic exam and a Pap test every three years. For women ages 63-65, they may recommend the Pap test and pelvic exam, combined with testing for human papilloma virus (HPV), every five years. Some types of HPV increase your risk of cervical cancer. Testing for HPV may also be done on women of any age who have unclear Pap test results.  Other health care providers may not recommend any screening for nonpregnant women who are considered low risk for pelvic cancer and have no symptoms. Ask your health care provider if a screening pelvic exam is right for you.  If you have had past treatment for cervical cancer or a condition that could lead to cancer, you need Pap tests and screening for cancer for at least 20 years after your treatment. If Pap tests  have been discontinued for you, your risk factors (such as having a new sexual partner) need to be reassessed to determine if you should start having screenings again. Some women have medical problems that increase the chance of getting cervical cancer. In these cases, your health care provider may recommend that you have screening and Pap tests more often.  If you have a family history of uterine cancer or ovarian cancer, talk with your health care provider about genetic screening.  If you have vaginal bleeding after reaching menopause, tell your health care provider.  There are currently no reliable tests available to screen for ovarian cancer. Lung Cancer Lung cancer screening is recommended for adults 108-6 years old who are at high risk for lung cancer because of a history of smoking. A yearly low-dose CT scan of the lungs is recommended if you:  Currently smoke.  Have a history of at least 30 pack-years of smoking and you currently smoke or have quit within the past 15 years. A pack-year is smoking an average of one pack of cigarettes per day for one year. Yearly screening should:  Continue until it has been 15 years since you quit.  Stop if you develop a health problem that would prevent you from having lung cancer treatment. Colorectal Cancer  This type of cancer can be detected and can often be prevented.  Routine colorectal cancer screening usually begins at age 68 and continues through age 32.  If you have risk factors for colon cancer, your health care provider may recommend that you be screened at an earlier age.  If you have a family history of colorectal cancer, talk with your health care provider about genetic screening.  Your health care provider may also recommend using home test kits to check for hidden blood in your stool.  A small camera at the end of a tube can be used to examine your colon directly (sigmoidoscopy or colonoscopy). This is done to check for the  earliest forms of colorectal cancer.  Direct examination of the colon should be repeated every 5-10 years until age 20. However, if early forms of precancerous polyps or small growths are found or if you have a family history or genetic risk for colorectal cancer, you may need to be screened more often. Skin Cancer  Check your skin from head to toe regularly.  Monitor any moles. Be sure to tell your health care provider:  About any new moles or changes in moles, especially if there is a change in a mole's shape or color.  If you have a mole that is larger than the size of a pencil eraser.  If any of your family members has a history of skin cancer, especially at a young age, talk with your health care provider about genetic screening.  Always use sunscreen. Apply sunscreen liberally and repeatedly throughout the day.  Whenever you are outside, protect yourself by wearing long sleeves, pants, a wide-brimmed hat, and sunglasses. WHAT SHOULD I KNOW ABOUT OSTEOPOROSIS? Osteoporosis is a condition in which bone destruction happens more quickly than new bone creation. After menopause, you may be at an increased risk for osteoporosis. To help prevent osteoporosis or the bone fractures that can happen because of osteoporosis, the following is recommended:  If you are 61-63 years old, get at least 1,000 mg of calcium and at least 600 mg of vitamin D per day.  If you are older than age 41 but younger than age 69, get at least 1,200 mg of calcium and at least 600 mg of vitamin D per day.  If you are older than age 77, get at least 1,200 mg of calcium and at least 800 mg of vitamin D per day. Smoking and excessive alcohol intake increase the risk of osteoporosis. Eat foods that are rich in calcium and vitamin D, and do weight-bearing exercises several times each week as directed by your health care provider. WHAT SHOULD I KNOW ABOUT HOW MENOPAUSE AFFECTS [place]? Depression may occur at any  age, but it is more common as you become older. Common symptoms of depression include:  Low or sad mood.  Changes in sleep patterns.  Changes in appetite or eating patterns.  Feeling an overall lack of motivation or enjoyment of activities that you previously enjoyed.  Frequent crying spells. Talk with your health care provider if you think that you are experiencing depression. WHAT SHOULD I KNOW ABOUT IMMUNIZATIONS? It is important that you get and maintain your immunizations. These include:  Tetanus, diphtheria, and pertussis (Tdap) booster vaccine.  Influenza every year before the flu season begins.  Pneumonia vaccine.  Shingles vaccine. Your health care provider may also recommend other immunizations.   This information is not intended to replace advice given to you by your health care  provider. Make sure you discuss any questions you have with your health care provider.   Document Released: 04/06/2005 Document Revised: 03/05/2014 Document Reviewed: 10/15/2013 [employer] Interactive Patient Education 2016 [employer]  

## 2015-12-27 NOTE — Progress Notes (Signed)
Patient ID: Jillian [name], female    DOB: October 17, 1950  Age: 65 y.o. MRN: [id number]  The patient is here for annual physical examination and management of other chronic and acute problems.   colonoscopy normal 2010  Mammogram set up for Jan 13 2016 X smoker quit 2001 .declines low dose CT scan      The risk factors are reflected in the social history.  The roster of all physicians providing medical care to patient - is listed in the Snapshot section of the chart.  Activities of daily living:  The patient is 100% independent in all ADLs: dressing, toileting, feeding as well as independent mobility  Home safety : The patient has smoke detectors in the home. They wear seatbelts.  There are no firearms at home. There is no violence in the home.   There is no risks for hepatitis, STDs or HIV. There is no   history of blood transfusion. They have no travel history to infectious disease endemic areas of the world.  The patient has seen their dentist in the last six month. They have seen their eye doctor in the last year. They admit to slight hearing difficulty with regard to whispered voices and some television programs.  They have deferred audiologic testing in the last year.  They do not  have excessive sun exposure. Discussed the need for sun protection: hats, long sleeves and use of sunscreen if there is significant sun exposure.   Diet: the importance of a healthy diet is discussed. They do have a healthy diet.  The benefits of regular aerobic exercise were discussed. She walks 4 times per week ,  20 minutes.   Depression screen: there are no signs or vegative symptoms of depression- irritability, change in appetite, anhedonia, sadness/tearfullness.  Cognitive assessment: the patient manages all their financial and personal affairs and is actively engaged. They could relate day,date,year and events; recalled 2/3 objects at 3 minutes; performed clock-face test normally.  The following  portions of the patient's history were reviewed and updated as appropriate: allergies, current medications, past family history, past medical history,  past surgical history, past social history  and problem list.  Visual acuity was not assessed per patient preference since she has regular follow up with her ophthalmologist. Hearing and body mass index were assessed and reviewed.   During the course of the visit the patient was educated and counseled about appropriate screening and preventive services including : fall prevention , diabetes screening, nutrition counseling, colorectal cancer screening, and recommended immunizations.    CC: The primary encounter diagnosis was Vaginal leukorrhea. Diagnoses of Cervical cancer screening, Mixed hyperlipidemia, Essential hypertension, Vitamin D deficiency, Fatigue, unspecified type, Encounter for immunization, Chronic vaginitis, and Encounter for preventive health examination were also pertinent to this visit.  Still having vaginal discharge,  associated with itching. In September,  cultures were normal/negative for bacterial /yeast infections. seh has not been sexually activi in over 10 years and her symptoms are relatively new. Does not douche or use estrogen cream.   History Jillian [name] has a past medical history of Allergy; Arthritis; Cancer [facility]); GERD (gastroesophageal reflux disease); Hyperlipidemia; Hypertension; and Irritable bowel syndrome (IBS) (Oct 2011).   She has a past surgical history that includes Tubal ligation (1977) and Tonsillectomy and adenoidectomy (1956).   Her family history includes Cancer in her cousin; Hyperlipidemia in her father, maternal grandfather, maternal grandmother, and mother; Hypertension in her father.She reports that she quit smoking about 16 years ago. Her smoking use  included Cigarettes. She has never used smokeless tobacco. She reports that she does not drink alcohol or use drugs.  Outpatient Medications Prior to  Visit  Medication Sig Dispense Refill  . ALPRAZolam (XANAX) 0.25 MG tablet Take 1 tablet (0.25 mg total) by mouth at bedtime as needed for sleep. 30 tablet 2  . aspirin 81 MG tablet Take 81 mg by mouth daily.      . Cholecalciferol (VITAMIN D3) 1000 UNITS CAPS Take 1 capsule by mouth daily.      . enalapril (VASOTEC) 20 MG tablet TAKE 1 TABLET TWICE DAILY 180 tablet 0  . fluticasone (FLONASE) 50 MCG/ACT nasal spray Place 2 sprays into the nose daily. 16 g 5  . ibuprofen (ADVIL,MOTRIN) 200 MG tablet Take 400 mg by mouth every 6 (six) hours as needed.      . loratadine (CLARITIN) 10 MG tablet Take 10 mg by mouth daily.    . magnesium oxide (MAG-OX) 400 MG tablet Take 400 mg by mouth daily.      . Melatonin 5 MG CAPS Take 1 capsule by mouth at bedtime as needed.    Marland Kitchen omeprazole (PRILOSEC) 20 MG capsule Take 1 capsule (20 mg total) by mouth daily as needed. 90 capsule 3  . simvastatin (ZOCOR) 40 MG tablet TAKE 1 TABLET EVERY NIGHT AT BEDTIME 90 tablet 0  . HYDROcodone-acetaminophen (NORCO/VICODIN) 5-325 MG tablet Take 1 tablet by mouth every 6 (six) hours as needed for moderate pain. (Patient not taking: Reported on 12/27/2015) 30 tablet 0  . zoster vaccine live, PF, (ZOSTAVAX) 60454 UNT/0.65ML injection Inject 19,400 Units into the skin once. (Patient not taking: Reported on 12/27/2015) 1 each 0  . zoster vaccine live, PF, (ZOSTAVAX) 09811 UNT/0.65ML injection Inject 19,400 Units into the skin once. (Patient not taking: Reported on 12/27/2015) 1 each 0  . amLODipine (NORVASC) 2.5 MG tablet TAKE 1 TABLET DAILY 30 tablet 2  . enalapril (VASOTEC) 20 MG tablet TAKE 1 TABLET TWICE DAILY 180 tablet 1  . enalapril (VASOTEC) 20 MG tablet TAKE 1 TABLET TWICE DAILY 180 tablet 0  . predniSONE (DELTASONE) 10 MG tablet Take 4 tablets ( total 40 mg) by mouth for 2 days; take 3 tablets ( total 30 mg) by mouth for 2 days; take 2 tablets ( total 20 mg) by mouth for 1 day; take 1 tablet ( total 10 mg) by mouth for 1  day. 17 tablet 0   No facility-administered medications prior to visit.     Review of Systems   Patient denies headache, fevers, malaise, unintentional weight loss, skin rash, eye pain, sinus congestion and sinus pain, sore throat, dysphagia,  hemoptysis , cough, dyspnea, wheezing, chest pain, palpitations, orthopnea, edema, abdominal pain, nausea, melena, diarrhea, constipation, flank pain, dysuria, hematuria, urinary  Frequency, nocturia, numbness, tingling, seizures,  Focal weakness, Loss of consciousness,  Tremor, insomnia, depression, anxiety, and suicidal ideation.      Objective:  BP 118/72   Pulse 65   Temp 98.3 F (36.8 C) (Oral)   Resp 12   Ht 5\' 6"  (1.676 m)   Wt 200 lb (90.7 kg)   SpO2 96%   BMI 32.28 kg/m    Physical Exam    General Appearance:    Alert, cooperative, no distress, appears stated age  Head:    Normocephalic, without obvious abnormality, atraumatic  Eyes:    PERRL, conjunctiva/corneas clear, EOM's intact, fundi    benign, both eyes  Ears:    Normal TM's and external  ear canals, both ears  Nose:   Nares normal, septum midline, mucosa normal, no drainage    or sinus tenderness  Throat:   Lips, mucosa, and tongue normal; teeth and gums normal  Neck:   Supple, symmetrical, trachea midline, no adenopathy;    thyroid:  no enlargement/tenderness/nodules; no carotid   bruit or JVD  Back:     Symmetric, no curvature, ROM normal, no CVA tenderness  Lungs:     Clear to auscultation bilaterally, respirations unlabored  Chest Wall:    No tenderness or deformity   Heart:    Regular rate and rhythm, S1 and S2 normal, no murmur, rub   or gallop  Breast Exam:    No tenderness, masses, or nipple abnormality  Abdomen:     Soft, non-tender, bowel sounds active all four quadrants,    no masses, no organomegaly  Genitalia:    Pelvic: cervix normal in appearance, external genitalia normal, no adnexal masses or tenderness, no cervical motion tenderness, rectovaginal  septum normal, uterus normal size, shape, and consistency and vagina normal without discharge  Extremities:   Extremities normal, atraumatic, no cyanosis or edema  Pulses:   2+ and symmetric all extremities  Skin:   Skin color, texture, turgor normal, no rashes or lesions  Lymph nodes:   Cervical, supraclavicular, and axillary nodes normal  Neurologic:   CNII-XII intact, normal strength, sensation and reflexes    throughout   Assessment & Plan:   Problem List Items Addressed This Visit    Vaginitis    Likely atrophic given absence of infection on prior testing . Repeat testing,  followed by trial of vaginal estrogen       Vaginal leukorrhea - Primary    Repeating testing for infection.  If negative will recommend trial of vaginal estrogen vs GYN referral       Relevant Orders   Cytology - PAP   Encounter for preventive health examination    Annual comprehensive preventive exam was done as well as an evaluation and management of chronic conditions .  During the course of the visit the patient was educated and counseled about appropriate screening and preventive services including :  diabetes screening, lipid analysis with projected  10 year  risk for CAD , nutrition counseling, breast, cervical and colorectal cancer screening, and recommended immunizations.  Printed recommendations for health maintenance screenings was given      Hyperlipidemia   Relevant Medications   amLODipine (NORVASC) 2.5 MG tablet   Other Relevant Orders   Lipid panel (Completed)   Hypertension   Relevant Medications   amLODipine (NORVASC) 2.5 MG tablet   Other Relevant Orders   Comprehensive metabolic panel (Completed)    Other Visit Diagnoses    Cervical cancer screening       Relevant Orders   Cytology - PAP   Vitamin D deficiency       Relevant Orders   VITAMIN D 25 Hydroxy (Vit-D Deficiency, Fractures) (Completed)   Fatigue, unspecified type       Relevant Orders   TSH (Completed)   CBC with  Differential/Platelet (Completed)   Encounter for immunization       Relevant Orders   Flu vaccine HIGH DOSE PF (Completed)      I have discontinued Ms. Cullars's predniSONE. I have also changed her amLODipine. Additionally, I am having her maintain her Vitamin D3, magnesium oxide, ibuprofen, aspirin, omeprazole, loratadine, ALPRAZolam, zoster vaccine live (PF), Melatonin, zoster vaccine live (PF), enalapril, HYDROcodone-acetaminophen, fluticasone, and  simvastatin.  Meds ordered this encounter  Medications  . amLODipine (NORVASC) 2.5 MG tablet    Sig: Take 1 tablet (2.5 mg total) by mouth daily.    Dispense:  30 tablet    Refill:  2    Medications Discontinued During This Encounter  Medication Reason  . amLODipine (NORVASC) 2.5 MG tablet Reorder  . predniSONE (DELTASONE) 10 MG tablet Completed Course  . enalapril (VASOTEC) 20 MG tablet Duplicate  . enalapril (VASOTEC) 20 MG tablet Duplicate    Follow-up: No Follow-up on file.   Crecencio Mc, MD

## 2015-12-29 ENCOUNTER — Encounter: Payer: Self-pay | Admitting: Internal Medicine

## 2015-12-29 DIAGNOSIS — N952 Postmenopausal atrophic vaginitis: Secondary | ICD-10-CM | POA: Insufficient documentation

## 2015-12-29 DIAGNOSIS — N898 Other specified noninflammatory disorders of vagina: Secondary | ICD-10-CM | POA: Insufficient documentation

## 2015-12-29 DIAGNOSIS — Z0000 Encounter for general adult medical examination without abnormal findings: Secondary | ICD-10-CM | POA: Insufficient documentation

## 2015-12-29 NOTE — Assessment & Plan Note (Signed)
Likely atrophic given absence of infection on prior testing . Repeat testing,  followed by trial of vaginal estrogen

## 2015-12-29 NOTE — Assessment & Plan Note (Signed)
I have addressed  BMI and recommended wt loss of 10% of body weight over the next 6 months using a low fat, low starch, high protein  fruit/vegetable based Mediterranean diet and 30 minutes of aerobic exercise a minimum of 5 days per week.   No results found for: HGBA1C

## 2015-12-29 NOTE — Assessment & Plan Note (Signed)
Repeating testing for infection.  If negative will recommend trial of vaginal estrogen vs GYN referral

## 2015-12-29 NOTE — Assessment & Plan Note (Signed)
Annual comprehensive preventive exam was done as well as an evaluation and management of chronic conditions .  During the course of the visit the patient was educated and counseled about appropriate screening and preventive services including :  diabetes screening, lipid analysis with projected  10 year  risk for CAD , nutrition counseling, breast, cervical and colorectal cancer screening, and recommended immunizations.  Printed recommendations for health maintenance screenings was given 

## 2015-12-30 LAB — CYTOLOGY - PAP
CHLAMYDIA, DNA PROBE: NEGATIVE
DIAGNOSIS: NEGATIVE
HPV: NOT DETECTED
Neisseria Gonorrhea: NEGATIVE
Trichomonas: NEGATIVE

## 2015-12-31 ENCOUNTER — Encounter: Payer: Self-pay | Admitting: Internal Medicine

## 2016-01-02 LAB — CERVICOVAGINAL ANCILLARY ONLY
Bacterial vaginitis: NEGATIVE
Candida vaginitis: NEGATIVE

## 2016-01-03 ENCOUNTER — Encounter: Payer: Self-pay | Admitting: Internal Medicine

## 2016-01-03 LAB — CERVICOVAGINAL ANCILLARY ONLY: Herpes: NEGATIVE

## 2016-01-12 ENCOUNTER — Encounter: Payer: Self-pay | Admitting: Internal Medicine

## 2016-01-13 ENCOUNTER — Ambulatory Visit
Admission: RE | Admit: 2016-01-13 | Discharge: 2016-01-13 | Disposition: A | Discharge status: Home / Self Care | Payer: Medicare Other | Source: Ambulatory Visit | Attending: Internal Medicine | Admitting: Internal Medicine

## 2016-01-13 DIAGNOSIS — Z1231 Encounter for screening mammogram for malignant neoplasm of breast: Secondary | ICD-10-CM | POA: Insufficient documentation

## 2016-01-13 MED ORDER — ZOSTER VACCINE LIVE 19400 UNT/0.65ML ~~LOC~~ SUSR: 0.6500 mL | Freq: Once | SUBCUTANEOUS | 0 refills | Status: AC

## 2016-01-13 NOTE — Telephone Encounter (Signed)
Who is responsible now for reading e mails?   I have handled this request , but in the future,  Refilling requests for vaccines does not need an MD action.  thanks

## 2016-01-17 DIAGNOSIS — H2513 Age-related nuclear cataract, bilateral: Secondary | ICD-10-CM | POA: Diagnosis not present

## 2016-02-07 ENCOUNTER — Ambulatory Visit (INDEPENDENT_AMBULATORY_CARE_PROVIDER_SITE_OTHER): Payer: Medicare Other

## 2016-02-07 VITALS — BP 112/70 | HR 64 | Temp 98.0°F | Resp 14 | Ht 66.0 in | Wt 201.8 lb

## 2016-02-07 DIAGNOSIS — Z0000 Encounter for general adult medical examination without abnormal findings: Secondary | ICD-10-CM

## 2016-02-07 NOTE — Patient Instructions (Addendum)
  Jillian [name] , Thank you for taking time to come for your Medicare Wellness Visit. I appreciate your ongoing commitment to your health goals. Please review the following plan we discussed and let me know if I can assist you in the future.   Follow up with Dr. Derrel Nip as needed.  These are the goals we discussed: Goals    . Increase physical activity          Water aerobics 2 times weekly for 1 hour Silver sneaker class 3 times week for 1 hour       This is a list of the screening recommended for you and due dates:  Health Maintenance  Topic Date Due  . Tetanus Vaccine  07/31/1968  . Shingles Vaccine  07/31/2009  . Pneumonia vaccines (2 of 2 - PPSV23) 11/29/2015  . Mammogram  01/12/2018  . Colon Cancer Screening  12/18/2018  . Flu Shot  Completed  . DEXA scan (bone density measurement)  Completed  .  Hepatitis C: One time screening is recommended by Center for Disease Control  (CDC) for  adults born from [birth year] through 1966.   Completed

## 2016-02-07 NOTE — Progress Notes (Signed)
Subjective:   Jillian [name] is a 65 y.o. female who presents for an Initial Medicare Annual Wellness Visit.  Review of Systems    No ROS.  Medicare Wellness Visit.  Cardiac Risk Factors include: advanced age (>79men, >36 women);hypertension     Objective:    Today's Vitals   02/07/16 0810 02/07/16 0836  BP:  102/70  Pulse:  64  Resp:  14  Temp:  98 F (36.7 C)  TempSrc:  Oral  SpO2:  97%  Weight:  201 lb 12.8 oz (91.5 kg)  Height: 5\' 6"  (1.676 m) 5\' 6"  (1.676 m)   Body mass index is 32.57 kg/m.   Current Medications (verified) Outpatient Encounter Prescriptions as of 02/07/2016  Medication Sig  . ALPRAZolam (XANAX) 0.25 MG tablet Take 1 tablet (0.25 mg total) by mouth at bedtime as needed for sleep.  Marland Kitchen amLODipine (NORVASC) 2.5 MG tablet Take 1 tablet (2.5 mg total) by mouth daily.  Marland Kitchen aspirin 81 MG tablet Take 81 mg by mouth daily.    . Cholecalciferol (VITAMIN D3) 1000 UNITS CAPS Take 1 capsule by mouth daily.    . enalapril (VASOTEC) 20 MG tablet TAKE 1 TABLET TWICE DAILY  . fluticasone (FLONASE) 50 MCG/ACT nasal spray Place 2 sprays into the nose daily.  Marland Kitchen HYDROcodone-acetaminophen (NORCO/VICODIN) 5-325 MG tablet Take 1 tablet by mouth every 6 (six) hours as needed for moderate pain.  Marland Kitchen ibuprofen (ADVIL,MOTRIN) 200 MG tablet Take 400 mg by mouth every 6 (six) hours as needed.    . loratadine (CLARITIN) 10 MG tablet Take 10 mg by mouth daily.  . magnesium oxide (MAG-OX) 400 MG tablet Take 400 mg by mouth daily.    . Melatonin 5 MG CAPS Take 1 capsule by mouth at bedtime as needed.  Marland Kitchen omeprazole (PRILOSEC) 20 MG capsule Take 1 capsule (20 mg total) by mouth daily as needed.  . simvastatin (ZOCOR) 40 MG tablet TAKE 1 TABLET EVERY NIGHT AT BEDTIME  . zoster vaccine live, PF, (ZOSTAVAX) 60454 UNT/0.65ML injection Inject 19,400 Units into the skin once.  . zoster vaccine live, PF, (ZOSTAVAX) 09811 UNT/0.65ML injection Inject 19,400 Units into the skin once.   No  facility-administered encounter medications on file as of 02/07/2016.     Allergies (verified) Doxycycline; Levaquin [levofloxacin in d5w]; Sulfa drugs cross reactors; and Tetanus toxoids   History: Past Medical History:  Diagnosis Date  . Allergy    seasonal rhinitis  . Arthritis    right shoulder,  no intervention  . Cancer ([place])    basal cell   . GERD (gastroesophageal reflux disease)   . Hyperlipidemia   . Hypertension   . Irritable bowel syndrome (IBS) Oct 2011   last colonoscopy showed diverticulum, no polyps   Past Surgical History:  Procedure Laterality Date  . TONSILLECTOMY AND ADENOIDECTOMY  1956  . TUBAL LIGATION  1977   Family History  Problem Relation Age of Onset  . Hyperlipidemia Mother   . Hyperlipidemia Father   . Hypertension Father   . Hyperlipidemia Maternal Grandmother   . Hyperlipidemia Maternal Grandfather   . Cancer Cousin   . Breast cancer Neg Hx    Social History   Occupational History  . 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 Smoking status: Former Smoker    Types: Cigarettes    Quit date: 03/16/1999  . Smokeless tobacco: Never Used  . Alcohol use No  . Drug use: No  .  Sexual activity: Not Currently    Tobacco Counseling Counseling given: Not Answered   Activities of Daily Living In your present state of health, do you have any difficulty performing the following activities: 02/07/2016  Hearing? N  Vision? N  Difficulty concentrating or making decisions? N  Walking or climbing stairs? Y  Dressing or bathing? N  Doing errands, shopping? N  Preparing Food and eating ? N  Using the Toilet? N  In the past six months, have you accidently leaked urine? N  Do you have problems with loss of bowel control? N  Managing your Medications? N  Managing your Finances? N  Housekeeping or managing your Housekeeping? N  Some recent data might be hidden    Immunizations and Health Maintenance Immunization  History  Administered Date(s) Administered  . Influenza, High Dose Seasonal PF 11/29/2014, 12/27/2015  . Influenza-Unspecified 11/18/2011, 11/10/2012, 11/25/2013  . Pneumococcal Conjugate-13 11/29/2014   Health Maintenance Due  Topic Date Due  . TETANUS/TDAP  07/31/1968  . ZOSTAVAX  07/31/2009  . PNA vac Low Risk Adult (2 of 2 - PPSV23) 11/29/2015    Patient Care Team: Crecencio Mc, MD as PCP - General (Internal Medicine)  Indicate any recent Medical Services you may have received from other than [facility] providers in the past year (date may be approximate).     Assessment:   This is a routine wellness examination for [facility]. The goal of the wellness visit is to assist the patient how to close the gaps in care and create a preventative care plan for the patient.   Taking calcium VIT D as appropriate/Osteoporosis risk reviewed.  Medications reviewed; taking without issues or barriers.  Safety issues reviewed; lives alone. Smoke detectors in the home. No firearms in the home. Wears seatbelts when driving or riding with others. No violence in the home.  No identified risk were noted; The patient was oriented x 3; appropriate in dress and manner and no objective failures at ADL's or IADL's.   BMI; discussed the importance of a healthy diet, water intake and exercise. Educational material provided.  Patient Concerns: None at this time. Follow up with PCP as needed.  Hearing/Vision screen Hearing Screening Comments: Passes the whisper test Vision Screening Comments: Followed by Dr. Ellin Mayhew Phillip Heal) Wears reading glasses Last OV 12/2015  Dietary issues and exercise activities discussed: Current Exercise Habits: Structured exercise class, Time (Minutes): 60, Frequency (Times/Week): 2, Weekly Exercise (Minutes/Week): 120, Intensity: Moderate  Goals    . Increase physical activity          Water aerobics 2 times weekly for 1 hour Silver sneaker class 3 times week for 1  hour      Depression Screen PHQ 2/9 Scores 02/07/2016 11/29/2014 03/30/2013  PHQ - 2 Score 0 0 0    Fall Risk Fall Risk  02/07/2016 11/29/2014  Falls in the past year? No No    Cognitive Function: MMSE - Mini Mental State Exam 02/07/2016  Orientation to time 5  Orientation to Place 5  Registration 3  Attention/ Calculation 5  Recall 3  Language- name 2 objects 2  Language- repeat 1  Language- follow 3 step command 3  Language- read & follow direction 1  Write a sentence 1  Copy design 1  Total score 30        Screening Tests Health Maintenance  Topic Date Due  . TETANUS/TDAP  07/31/1968  . ZOSTAVAX  07/31/2009  . PNA vac Low Risk Adult (2 of 2 -  PPSV23) 11/29/2015  . MAMMOGRAM  01/12/2018  . COLONOSCOPY  12/18/2018  . INFLUENZA VACCINE  Completed  . DEXA SCAN  Completed  . Hepatitis C Screening  Completed      Plan:    End of life planning; Advance aging; Advanced directives discussed. No HCPOA/Living Will requested.  Medicare Attestation I have personally reviewed: The patient's medical and social history Their use of alcohol, tobacco or illicit drugs Their current medications and supplements The patient's functional ability including ADLs,fall risks, home safety risks, cognitive, and hearing and visual impairment Diet and physical activities Evidence for depression   The patient's weight, height, BMI, and visual acuity have been recorded in the chart.  I have made referrals and provided education to the patient based on review of the above and I have provided the patient with a written personalized care plan for preventive services.    During the course of the visit, Kimari was educated and counseled about the following appropriate screening and preventive services:   Vaccines to include Pneumoccal, Influenza, Hepatitis B, Td, Zostavax, HCV  Electrocardiogram  Cardiovascular disease screening  Colorectal cancer screening  Bone density  screening  Diabetes screening  Glaucoma screening  Mammography/PAP  Nutrition counseling  Smoking cessation counseling  Patient Instructions (the written plan) were given to the patient.    Varney Biles, LPN   [phone]    Reviewed above information.  Agree with plan.  Dr Nicki Reaper

## 2016-03-22 ENCOUNTER — Other Ambulatory Visit: Payer: Self-pay | Admitting: Internal Medicine

## 2016-04-23 ENCOUNTER — Other Ambulatory Visit: Payer: Self-pay | Admitting: Internal Medicine

## 2016-05-21 ENCOUNTER — Ambulatory Visit (INDEPENDENT_AMBULATORY_CARE_PROVIDER_SITE_OTHER): Payer: Medicare Other | Admitting: Internal Medicine

## 2016-05-21 ENCOUNTER — Ambulatory Visit: Payer: Medicare Other | Admitting: Internal Medicine

## 2016-05-21 VITALS — BP 130/82 | HR 70 | Temp 98.2°F | Wt 203.4 lb

## 2016-05-21 DIAGNOSIS — I10 Essential (primary) hypertension: Secondary | ICD-10-CM

## 2016-05-21 DIAGNOSIS — B9789 Other viral agents as the cause of diseases classified elsewhere: Secondary | ICD-10-CM

## 2016-05-21 DIAGNOSIS — J069 Acute upper respiratory infection, unspecified: Secondary | ICD-10-CM | POA: Diagnosis not present

## 2016-05-21 DIAGNOSIS — K219 Gastro-esophageal reflux disease without esophagitis: Secondary | ICD-10-CM

## 2016-05-21 DIAGNOSIS — R509 Fever, unspecified: Secondary | ICD-10-CM | POA: Diagnosis not present

## 2016-05-21 LAB — POC INFLUENZA A&B (BINAX/QUICKVUE)
INFLUENZA A, POC: NEGATIVE
INFLUENZA B, POC: NEGATIVE

## 2016-05-21 MED ORDER — BENZONATATE 200 MG PO CAPS: 200.0000 mg | ORAL_CAPSULE | Freq: Three times a day (TID) | ORAL | 1 refills | Status: DC | PRN

## 2016-05-21 MED ORDER — PREDNISONE 10 MG PO TABS: ORAL_TABLET | ORAL | 0 refills | Status: DC

## 2016-05-21 MED ORDER — GUAIFENESIN-CODEINE 100-10 MG/5ML PO SYRP: 10.0000 mL | ORAL_SOLUTION | Freq: Every evening | ORAL | 0 refills | Status: DC | PRN

## 2016-05-21 NOTE — Patient Instructions (Addendum)
Take the vitamin D 2000 IUs every other day    Ok to take pepcid up to twice daily instead of daily prevacid   Hair skin and nails vitamins are ok to take  For your infection:  Your flu test was negative.    Prednisone   Taper for 6 days to resolve the inflammation   Tessalon perles 200 mg every 8 hours for cough   cheratussin with codeine for nighttime cough

## 2016-05-21 NOTE — Progress Notes (Signed)
Pre visit review using our clinic review tool, if applicable. No additional management support is needed unless otherwise documented below in the visit note. 

## 2016-05-21 NOTE — Progress Notes (Signed)
Subjective:  Patient ID: Jillian [name], female    DOB: 1951/01/08  Age: 66 y.o. MRN: [id number]  CC: The primary encounter diagnosis was Fever, unspecified fever cause. Diagnoses of Viral URI with cough, Gastroesophageal reflux disease without esophagitis, and Essential hypertension were also pertinent to this visit.  HPI Jillian [name] presents for respiratory  infection,  Started a week ago, after spending  the day working in her yard.  No sick contacts  Had a low grade fever for 4 days,  Had body aches and chills;Marland Kitchen Now resolved.  Still with cough and congestion  Has been taking Coricidin cold n flu, sudafed PE  Allergy meds,  And saline irrigation of sinus passages.  Has noted some improvement, denies facial and ear pain,    Had the flu vaccine   Outpatient Medications Prior to Visit  Medication Sig Dispense Refill  . ALPRAZolam (XANAX) 0.25 MG tablet Take 1 tablet (0.25 mg total) by mouth at bedtime as needed for sleep. 30 tablet 2  . amLODipine (NORVASC) 2.5 MG tablet TAKE 1 TABLET DAILY 30 tablet 1  . aspirin 81 MG tablet Take 81 mg by mouth daily.      . Cholecalciferol (VITAMIN D3) 1000 UNITS CAPS Take 1 capsule by mouth daily.      . enalapril (VASOTEC) 20 MG tablet TAKE 1 TABLET TWICE DAILY 60 tablet 1  . fluticasone (FLONASE) 50 MCG/ACT nasal spray Place 2 sprays into the nose daily. 16 g 5  . HYDROcodone-acetaminophen (NORCO/VICODIN) 5-325 MG tablet Take 1 tablet by mouth every 6 (six) hours as needed for moderate pain. 30 tablet 0  . ibuprofen (ADVIL,MOTRIN) 200 MG tablet Take 400 mg by mouth every 6 (six) hours as needed.      . loratadine (CLARITIN) 10 MG tablet Take 10 mg by mouth daily.    . magnesium oxide (MAG-OX) 400 MG tablet Take 400 mg by mouth daily.      . Melatonin 5 MG CAPS Take 1 capsule by mouth at bedtime as needed.    Marland Kitchen omeprazole (PRILOSEC) 20 MG capsule Take 1 capsule (20 mg total) by mouth daily as needed. 90 capsule 3  . simvastatin (ZOCOR)  40 MG tablet TAKE 1 TABLET BY MOUTH EVERY NIGHT AT BEDTIME 90 tablet 0  . zoster vaccine live, PF, (ZOSTAVAX) 12878 UNT/0.65ML injection Inject 19,400 Units into the skin once. 1 each 0  . zoster vaccine live, PF, (ZOSTAVAX) 67672 UNT/0.65ML injection Inject 19,400 Units into the skin once. 1 each 0   No facility-administered medications prior to visit.     Review of Systems;  Patient denies headache, fevers, malaise, unintentional weight loss, skin rash, eye pain, sinus congestion and sinus pain, sore throat, dysphagia,  hemoptysis , cough, dyspnea, wheezing, chest pain, palpitations, orthopnea, edema, abdominal pain, nausea, melena, diarrhea, constipation, flank pain, dysuria, hematuria, urinary  Frequency, nocturia, numbness, tingling, seizures,  Focal weakness, Loss of consciousness,  Tremor, insomnia, depression, anxiety, and suicidal ideation.      Objective:  BP 130/82   Pulse 70   Temp 98.2 F (36.8 C) (Oral)   Wt 203 lb 6 oz (92.3 kg)   SpO2 96%   BMI 32.83 kg/m   BP Readings from Last 3 Encounters:  05/21/16 130/82  02/07/16 112/70  12/27/15 118/72    Wt Readings from Last 3 Encounters:  05/21/16 203 lb 6 oz (92.3 kg)  02/07/16 201 lb 12.8 oz (91.5 kg)  12/27/15 200 lb (90.7 kg)  General appearance: alert, cooperative and appears stated age Ears:dull  TM's bilaterally without bulging or erythema, normal  external ear canals both ears Throat: lips, mucosa, and tongue normal; teeth and gums normal Neck: no adenopathy, no carotid bruit, supple, symmetrical, trachea midline and thyroid not enlarged, symmetric, no tenderness/mass/nodules Back: symmetric, no curvature. ROM normal. No CVA tenderness. Lungs: clear to auscultation bilaterally Heart: regular rate and rhythm, S1, S2 normal, no murmur, click, rub or gallop Abdomen: soft, non-tender; bowel sounds normal; no masses,  no organomegaly Pulses: 2+ and symmetric Skin: Skin color, texture, turgor normal. No rashes  or lesions Lymph nodes: Cervical, supraclavicular, and axillary nodes normal.  No results found for: HGBA1C  Lab Results  Component Value Date   CREATININE 1.12 12/27/2015   CREATININE 1.03 05/30/2015   CREATININE 1.07 11/29/2014    Lab Results  Component Value Date   WBC 8.7 12/27/2015   HGB 12.7 12/27/2015   HCT 37.2 12/27/2015   PLT 247.0 12/27/2015   GLUCOSE 87 12/27/2015   CHOL 169 12/27/2015   TRIG 154.0 (H) 12/27/2015   HDL 55.10 12/27/2015   LDLDIRECT 85.0 05/30/2015   LDLCALC 83 12/27/2015   ALT 13 12/27/2015   AST 17 12/27/2015   NA 142 12/27/2015   K 4.9 12/27/2015   CL 106 12/27/2015   CREATININE 1.12 12/27/2015   BUN 19 12/27/2015   CO2 28 12/27/2015   TSH 1.37 12/27/2015    Mm Screening Breast Tomo Bilateral  Result Date: 01/16/2016 CLINICAL DATA:  Screening. EXAM: 2D DIGITAL SCREENING BILATERAL MAMMOGRAM WITH CAD AND ADJUNCT TOMO COMPARISON:  Previous exam(s). ACR Breast Density Category b: There are scattered areas of fibroglandular density. FINDINGS: There are no findings suspicious for malignancy. Images were processed with CAD. IMPRESSION: No mammographic evidence of malignancy. A result letter of this screening mammogram will be mailed directly to the patient. RECOMMENDATION: Screening mammogram in one year. (Code:SM-B-01Y) BI-RADS CATEGORY  1: Negative. Electronically Signed   By: Everlean Alstrom M.D.   On: 01/16/2016 07:55    Assessment & Plan:   Problem List Items Addressed This Visit    GERD (gastroesophageal reflux disease)    The risks of long term PPI use for acid suppression in patients without documented Barretts esophagus were discussed, including the possibility of osteoporosis, iron , B12 and magnesium deficiencies,  CKD, and dementia. Suggested trial of pepcid 20 mg daily and if GERD symptoms return,  To resume daily PPI and  referral for EGD.        Hypertension    Well controlled on current regimen. Renal function stable, no  changes today.  Lab Results  Component Value Date   CREATININE 1.12 12/27/2015   Lab Results  Component Value Date   NA 142 12/27/2015   K 4.9 12/27/2015   CL 106 12/27/2015   CO2 28 12/27/2015         Viral URI with cough    Rapid flu test was done for historical significance and was negative.  Will treat for bronchitis with prednisone taper and cough suppressant        Other Visit Diagnoses    Fever, unspecified fever cause    -  Primary   Relevant Orders   POC Influenza A&B(BINAX/QUICKVUE) (Completed)      I am having Jillian [name] start on predniSONE, benzonatate, and guaiFENesin-codeine. I am also having her maintain her Vitamin D3, magnesium oxide, ibuprofen, aspirin, omeprazole, loratadine, ALPRAZolam, zoster vaccine live (PF), Melatonin, zoster vaccine live (PF),  HYDROcodone-acetaminophen, fluticasone, simvastatin, amLODipine, and enalapril.  Meds ordered this encounter  Medications  . predniSONE (DELTASONE) 10 MG tablet    Sig: 6 tablets on Day 1 , then reduce by 1 tablet daily until gone    Dispense:  21 tablet    Refill:  0  . benzonatate (TESSALON) 200 MG capsule    Sig: Take 1 capsule (200 mg total) by mouth 3 (three) times daily as needed for cough.    Dispense:  60 capsule    Refill:  1  . guaiFENesin-codeine (CHERATUSSIN AC) 100-10 MG/5ML syrup    Sig: Take 10 mLs by mouth at bedtime and may repeat dose one time if needed.    Dispense:  118 mL    Refill:  0    There are no discontinued medications.  Follow-up: No Follow-up on file.   Crecencio Mc, MD

## 2016-05-22 ENCOUNTER — Encounter: Payer: Self-pay | Admitting: Internal Medicine

## 2016-05-22 DIAGNOSIS — B9789 Other viral agents as the cause of diseases classified elsewhere: Secondary | ICD-10-CM

## 2016-05-22 DIAGNOSIS — J069 Acute upper respiratory infection, unspecified: Secondary | ICD-10-CM | POA: Insufficient documentation

## 2016-05-22 NOTE — Assessment & Plan Note (Signed)
Rapid flu test was done for historical significance and was negative.  Will treat for bronchitis with prednisone taper and cough suppressant

## 2016-05-22 NOTE — Assessment & Plan Note (Signed)
The risks of long term PPI use for acid suppression in patients without documented Barretts esophagus were discussed, including the possibility of osteoporosis, iron , B12 and magnesium deficiencies,  CKD, and dementia. Suggested trial of pepcid 20 mg daily and if GERD symptoms return,  To resume daily PPI and  referral for EGD.   

## 2016-05-22 NOTE — Assessment & Plan Note (Signed)
Well controlled on current regimen. Renal function stable, no changes today.  Lab Results  Component Value Date   CREATININE 1.12 12/27/2015   Lab Results  Component Value Date   NA 142 12/27/2015   K 4.9 12/27/2015   CL 106 12/27/2015   CO2 28 12/27/2015

## 2016-05-23 ENCOUNTER — Other Ambulatory Visit: Payer: Self-pay | Admitting: Internal Medicine

## 2016-05-23 NOTE — Telephone Encounter (Signed)
Do not see lactulose in pt's med list.   Last OV: 05/21/2016 Next OV: 06/25/2016

## 2016-05-24 MED ORDER — LACTULOSE 20 GM/30ML PO SOLN: ORAL | 3 refills | Status: DC

## 2016-05-24 NOTE — Telephone Encounter (Signed)
HISTORICAL REFILLED

## 2016-06-07 ENCOUNTER — Encounter: Payer: Self-pay | Admitting: Family

## 2016-06-07 ENCOUNTER — Ambulatory Visit (INDEPENDENT_AMBULATORY_CARE_PROVIDER_SITE_OTHER): Payer: Medicare Other | Admitting: Family

## 2016-06-07 VITALS — BP 122/68 | HR 71 | Temp 98.5°F | Ht 66.0 in | Wt 199.8 lb

## 2016-06-07 DIAGNOSIS — J019 Acute sinusitis, unspecified: Secondary | ICD-10-CM

## 2016-06-07 MED ORDER — AMOXICILLIN 500 MG PO CAPS: 500.0000 mg | ORAL_CAPSULE | Freq: Two times a day (BID) | ORAL | 0 refills | Status: DC

## 2016-06-07 NOTE — Patient Instructions (Signed)
Start amoxicillin  Take probiotics during and 2 weeks after medication  If congestion if thick- I would advise mucinex.   If thin and runny, you may stay on claritin, flonase regimen.   As discussed, I do have concern for diverticulitis.   If you change your mind or have any new, worsening, symptoms, please seek urgent care and I would advise go to ED to evaluate for diverticulitis.

## 2016-06-07 NOTE — Progress Notes (Signed)
Subjective:    Patient ID: Jillian [name], female    DOB: Mar 23, 1950, 66 y.o.   MRN: [id number]  CC: Jillian [name] is a 66 y.o. female who presents today for follow up.   HPI: Seen 3 weeks ago and treated for bronchitis with prednisone, tessalon with some resolve however seemed to come back.   Once off prednisone, congestion came back, thin runny and 'tickling' back of throat.   Complains of  Continued congestion, low grade fever ( 99.8), chills. Nausea and non bloody diarrhea x 3 days ago. Has 4-5 diarrhea per day .   Does note intermittent for months right low back pain, dull ache, improves with diarrhea. She has had this same pain in the past and 'always thought my IBS.' This has not worsened.   No vomiting, fever, dysuria, dyspepsia.   Has diverticulosis.   h/o IBS, GERD.     HISTORY:  Past Medical History:  Diagnosis Date  . Allergy    seasonal rhinitis  . Arthritis    right shoulder,  no intervention  . Cancer ([place])    basal cell   . GERD (gastroesophageal reflux disease)   . Hyperlipidemia   . Hypertension   . Irritable bowel syndrome (IBS) Oct 2011   last colonoscopy showed diverticulum, no polyps   Past Surgical History:  Procedure Laterality Date  . TONSILLECTOMY AND ADENOIDECTOMY  1956  . TUBAL LIGATION  1977   Family History  Problem Relation Age of Onset  . Hyperlipidemia Mother   . Hyperlipidemia Father   . Hypertension Father   . Hyperlipidemia Maternal Grandmother   . Hyperlipidemia Maternal Grandfather   . Cancer Cousin   . Breast cancer Neg Hx     Allergies: Doxycycline; Levaquin [levofloxacin in d5w]; Sulfa drugs cross reactors; and Tetanus toxoids Current Outpatient Prescriptions on File Prior to Visit  Medication Sig Dispense Refill  . ALPRAZolam (XANAX) 0.25 MG tablet Take 1 tablet (0.25 mg total) by mouth at bedtime as needed for sleep. 30 tablet 2  . amLODipine (NORVASC) 2.5 MG tablet TAKE 1 TABLET DAILY 30 tablet 2  .  aspirin 81 MG tablet Take 81 mg by mouth daily.      . benzonatate (TESSALON) 200 MG capsule Take 1 capsule (200 mg total) by mouth 3 (three) times daily as needed for cough. 60 capsule 1  . Cholecalciferol (VITAMIN D3) 1000 UNITS CAPS Take 1 capsule by mouth daily.      . enalapril (VASOTEC) 20 MG tablet TAKE 1 TABLET TWICE DAILY 60 tablet 1  . fluticasone (FLONASE) 50 MCG/ACT nasal spray Place 2 sprays into the nose daily. 16 g 5  . guaiFENesin-codeine (CHERATUSSIN AC) 100-10 MG/5ML syrup Take 10 mLs by mouth at bedtime and may repeat dose one time if needed. 118 mL 0  . HYDROcodone-acetaminophen (NORCO/VICODIN) 5-325 MG tablet Take 1 tablet by mouth every 6 (six) hours as needed for moderate pain. 30 tablet 0  . ibuprofen (ADVIL,MOTRIN) 200 MG tablet Take 400 mg by mouth every 6 (six) hours as needed.      . Lactulose 20 GM/30ML SOLN 30 ml every 4 hours until constipation is relieved 236 mL 3  . loratadine (CLARITIN) 10 MG tablet Take 10 mg by mouth daily.    . magnesium oxide (MAG-OX) 400 MG tablet Take 400 mg by mouth daily.      . Melatonin 5 MG CAPS Take 1 capsule by mouth at bedtime as needed.    Marland Kitchen omeprazole (  PRILOSEC) 20 MG capsule Take 1 capsule (20 mg total) by mouth daily as needed. 90 capsule 3  . predniSONE (DELTASONE) 10 MG tablet 6 tablets on Day 1 , then reduce by 1 tablet daily until gone 21 tablet 0  . simvastatin (ZOCOR) 40 MG tablet TAKE 1 TABLET BY MOUTH EVERY NIGHT AT BEDTIME 90 tablet 0  . zoster vaccine live, PF, (ZOSTAVAX) 39767 UNT/0.65ML injection Inject 19,400 Units into the skin once. 1 each 0  . zoster vaccine live, PF, (ZOSTAVAX) 34193 UNT/0.65ML injection Inject 19,400 Units into the skin once. 1 each 0   No current facility-administered medications on file prior to visit.     Social History  Substance Use Topics  . Smoking status: Former Smoker    Types: Cigarettes    Quit date: 03/16/1999  . Smokeless tobacco: Never Used  . Alcohol use No    Review of  Systems  Constitutional: Positive for chills. Negative for fever.  HENT: Positive for congestion, postnasal drip and rhinorrhea. Negative for ear pain and sore throat.   Respiratory: Negative for cough, shortness of breath and wheezing.   Cardiovascular: Negative for chest pain and palpitations.  Gastrointestinal: Positive for diarrhea and nausea. Negative for abdominal distention, abdominal pain, constipation and vomiting.  Genitourinary: Negative for dysuria.  Musculoskeletal: Positive for back pain.      Objective:    There were no vitals taken for this visit. BP Readings from Last 3 Encounters:  05/21/16 130/82  02/07/16 112/70  12/27/15 118/72   Wt Readings from Last 3 Encounters:  05/21/16 203 lb 6 oz (92.3 kg)  02/07/16 201 lb 12.8 oz (91.5 kg)  12/27/15 200 lb (90.7 kg)    Physical Exam  Constitutional: She appears well-developed and well-nourished.  HENT:  Head: Normocephalic and atraumatic.  Right Ear: Hearing, tympanic membrane, external ear and ear canal normal. No drainage, swelling or tenderness. No foreign bodies. Tympanic membrane is not erythematous and not bulging. No middle ear effusion. No decreased hearing is noted.  Left Ear: Hearing, tympanic membrane, external ear and ear canal normal. No drainage, swelling or tenderness. No foreign bodies. Tympanic membrane is not erythematous and not bulging.  No middle ear effusion. No decreased hearing is noted.  Nose: Rhinorrhea present. Right sinus exhibits no maxillary sinus tenderness and no frontal sinus tenderness. Left sinus exhibits no maxillary sinus tenderness and no frontal sinus tenderness.  Mouth/Throat: Uvula is midline, oropharynx is clear and moist and mucous membranes are normal. No oropharyngeal exudate, posterior oropharyngeal edema, posterior oropharyngeal erythema or tonsillar abscesses.  Thick congestion seen posterior pharygnx  Eyes: Conjunctivae are normal.  Cardiovascular: Normal rate, regular  rhythm, normal heart sounds and normal pulses.   Pulmonary/Chest: Effort normal and breath sounds normal. She has no wheezes. She has no rhonchi. She has no rales.  Abdominal: Soft. Normal appearance and bowel sounds are normal. She exhibits no distension, no fluid wave, no ascites and no mass. There is no tenderness. There is no rigidity, no rebound, no guarding and no CVA tenderness.  Lymphadenopathy:       Head (right side): No submental, no submandibular, no tonsillar, no preauricular, no posterior auricular and no occipital adenopathy present.       Head (left side): No submental, no submandibular, no tonsillar, no preauricular, no posterior auricular and no occipital adenopathy present.    She has no cervical adenopathy.  Neurological: She is alert.  Skin: Skin is warm and dry.  Psychiatric: She has a normal  mood and affect. Her speech is normal and behavior is normal. Thought content normal.  Vitals reviewed.      Assessment & Plan:   1. Acute non-recurrent sinusitis, unspecified location Afebrile and well appearing today. Reassured by benign abdominal exam. I advised patient to have abdominal CT based on diarrhea, chills, and h/o diverticulosis. She politely declines as suspects diarrhea is r/t amount of congestion. I agreed reasonable only if she remains vigilant and if symptoms, do not quickly improve on antibiotic, she will seek urgent attention and advised the ED.   - amoxicillin (AMOXIL) 500 MG capsule; Take 1 capsule (500 mg total) by mouth 2 (two) times daily.  Dispense: 14 capsule; Refill: 0   I am having Ms. Stahle maintain her Vitamin D3, magnesium oxide, ibuprofen, aspirin, omeprazole, loratadine, ALPRAZolam, zoster vaccine live (PF), Melatonin, zoster vaccine live (PF), HYDROcodone-acetaminophen, fluticasone, simvastatin, enalapril, predniSONE, benzonatate, guaiFENesin-codeine, amLODipine, and Lactulose.   No orders of the defined types were placed in this  encounter.   Return precautions given.   Risks, benefits, and alternatives of the medications and treatment plan prescribed today were discussed, and patient expressed understanding.   Education regarding symptom management and diagnosis given to patient on AVS.  Continue to follow with TULLO, Aris Everts, MD for routine health maintenance.   Jillian [name] and I agreed with plan.   Mable Paris, FNP

## 2016-06-07 NOTE — Progress Notes (Signed)
Pre visit review using our clinic review tool, if applicable. No additional management support is needed unless otherwise documented below in the visit note. 

## 2016-06-10 ENCOUNTER — Encounter: Payer: Self-pay | Admitting: Family

## 2016-06-11 ENCOUNTER — Ambulatory Visit
Admission: RE | Admit: 2016-06-11 | Discharge: 2016-06-11 | Disposition: A | Discharge status: Home / Self Care | Payer: Medicare Other | Source: Ambulatory Visit | Attending: Family | Admitting: Family

## 2016-06-11 ENCOUNTER — Other Ambulatory Visit (INDEPENDENT_AMBULATORY_CARE_PROVIDER_SITE_OTHER): Payer: Medicare Other

## 2016-06-11 ENCOUNTER — Telehealth: Payer: Self-pay | Admitting: Family

## 2016-06-11 ENCOUNTER — Telehealth: Payer: Self-pay | Admitting: Internal Medicine

## 2016-06-11 ENCOUNTER — Other Ambulatory Visit: Payer: Self-pay | Admitting: Family

## 2016-06-11 DIAGNOSIS — R109 Unspecified abdominal pain: Secondary | ICD-10-CM | POA: Insufficient documentation

## 2016-06-11 DIAGNOSIS — K5730 Diverticulosis of large intestine without perforation or abscess without bleeding: Secondary | ICD-10-CM | POA: Insufficient documentation

## 2016-06-11 DIAGNOSIS — I2510 Atherosclerotic heart disease of native coronary artery without angina pectoris: Secondary | ICD-10-CM | POA: Insufficient documentation

## 2016-06-11 DIAGNOSIS — R197 Diarrhea, unspecified: Secondary | ICD-10-CM

## 2016-06-11 DIAGNOSIS — K769 Liver disease, unspecified: Secondary | ICD-10-CM | POA: Diagnosis not present

## 2016-06-11 DIAGNOSIS — I700 Atherosclerosis of aorta: Secondary | ICD-10-CM | POA: Insufficient documentation

## 2016-06-11 LAB — CBC WITH DIFFERENTIAL/PLATELET
BASOS ABS: 0 10*3/uL (ref 0.0–0.1)
Basophils Relative: 0.5 % (ref 0.0–3.0)
EOS PCT: 5.5 % — AB (ref 0.0–5.0)
Eosinophils Absolute: 0.4 10*3/uL (ref 0.0–0.7)
HCT: 39.6 % (ref 36.0–46.0)
Hemoglobin: 13.3 g/dL (ref 12.0–15.0)
Lymphocytes Relative: 21.9 % (ref 12.0–46.0)
Lymphs Abs: 1.6 10*3/uL (ref 0.7–4.0)
MCHC: 33.5 g/dL (ref 30.0–36.0)
MCV: 88.3 fl (ref 78.0–100.0)
MONO ABS: 0.7 10*3/uL (ref 0.1–1.0)
MONOS PCT: 9.9 % (ref 3.0–12.0)
NEUTROS PCT: 62.2 % (ref 43.0–77.0)
Neutro Abs: 4.5 10*3/uL (ref 1.4–7.7)
Platelets: 256 10*3/uL (ref 150.0–400.0)
RBC: 4.48 Mil/uL (ref 3.87–5.11)
RDW: 13.8 % (ref 11.5–15.5)
WBC: 7.3 10*3/uL (ref 4.0–10.5)

## 2016-06-11 LAB — COMPREHENSIVE METABOLIC PANEL
ALT: 16 U/L (ref 0–35)
AST: 24 U/L (ref 0–37)
Albumin: 4.2 g/dL (ref 3.5–5.2)
Alkaline Phosphatase: 53 U/L (ref 39–117)
BUN: 20 mg/dL (ref 6–23)
CALCIUM: 9.7 mg/dL (ref 8.4–10.5)
CHLORIDE: 105 meq/L (ref 96–112)
CO2: 25 meq/L (ref 19–32)
Creatinine, Ser: 1.24 mg/dL — ABNORMAL HIGH (ref 0.40–1.20)
GFR: 45.88 mL/min — AB (ref >60.00)
Glucose, Bld: 105 mg/dL — ABNORMAL HIGH (ref 70–99)
POTASSIUM: 4.4 meq/L (ref 3.5–5.1)
SODIUM: 138 meq/L (ref 135–145)
Total Bilirubin: 0.5 mg/dL (ref 0.2–1.2)
Total Protein: 7.3 g/dL (ref 6.0–8.3)

## 2016-06-11 LAB — POCT I-STAT CREATININE: CREATININE: 1.2 mg/dL — AB (ref 0.44–1.00)

## 2016-06-11 LAB — LIPASE: Lipase: 31 U/L (ref 11.0–59.0)

## 2016-06-11 MED ORDER — IOPAMIDOL (ISOVUE-300) INJECTION 61%
100.0000 mL | Freq: Once | INTRAVENOUS | Status: AC | PRN
  Administered 2016-06-11: 100 mL via INTRAVENOUS

## 2016-06-11 NOTE — Telephone Encounter (Signed)
FYI

## 2016-06-11 NOTE — Telephone Encounter (Signed)
Discussed results of CT with pain  Still having diarrhea, non bloody. Stomach 'feels irritated.' States 2 episodes per day. No fever, vomiting.  Endorses nausea.   Congestion has improved on amoxicillin.  'Had this before' with IBS.   Eating plenty of yogurt. Following clear liquids- broths. No  pain with eating.   Discussed lab results.   Stool culture and if normal, will start imodium.

## 2016-06-11 NOTE — Telephone Encounter (Signed)
noted 

## 2016-06-11 NOTE — Progress Notes (Unsigned)
Call pt-   Ensure she saw mychart message this morning about stat ct and LABS today

## 2016-06-11 NOTE — Telephone Encounter (Signed)
Call came in from Cat scan at [facility] regarding report in [place]. Ordered by Kerrie Buffalo. Thank you!

## 2016-06-11 NOTE — Progress Notes (Signed)
Spoke to patient. I have set up ab appointment for today @ 1445.  Patient is awaiting phone call from [facility] about CT scan.

## 2016-06-14 ENCOUNTER — Other Ambulatory Visit: Payer: Medicare Other

## 2016-06-14 DIAGNOSIS — R197 Diarrhea, unspecified: Secondary | ICD-10-CM | POA: Diagnosis not present

## 2016-06-18 LAB — STOOL CULTURE

## 2016-06-24 ENCOUNTER — Other Ambulatory Visit: Payer: Self-pay | Admitting: Internal Medicine

## 2016-06-25 ENCOUNTER — Ambulatory Visit (INDEPENDENT_AMBULATORY_CARE_PROVIDER_SITE_OTHER): Payer: Medicare Other | Admitting: Internal Medicine

## 2016-06-25 ENCOUNTER — Encounter: Payer: Self-pay | Admitting: Internal Medicine

## 2016-06-25 VITALS — BP 100/68 | HR 68 | Temp 98.2°F | Resp 15 | Ht 66.0 in | Wt 198.6 lb

## 2016-06-25 DIAGNOSIS — Z1159 Encounter for screening for other viral diseases: Secondary | ICD-10-CM | POA: Diagnosis not present

## 2016-06-25 DIAGNOSIS — I700 Atherosclerosis of aorta: Secondary | ICD-10-CM | POA: Diagnosis not present

## 2016-06-25 DIAGNOSIS — I10 Essential (primary) hypertension: Secondary | ICD-10-CM | POA: Diagnosis not present

## 2016-06-25 DIAGNOSIS — E6609 Other obesity due to excess calories: Secondary | ICD-10-CM

## 2016-06-25 DIAGNOSIS — E782 Mixed hyperlipidemia: Secondary | ICD-10-CM | POA: Diagnosis not present

## 2016-06-25 DIAGNOSIS — J301 Allergic rhinitis due to pollen: Secondary | ICD-10-CM | POA: Diagnosis not present

## 2016-06-25 DIAGNOSIS — E559 Vitamin D deficiency, unspecified: Secondary | ICD-10-CM | POA: Diagnosis not present

## 2016-06-25 DIAGNOSIS — Z6832 Body mass index (BMI) 32.0-32.9, adult: Secondary | ICD-10-CM

## 2016-06-25 DIAGNOSIS — R944 Abnormal results of kidney function studies: Secondary | ICD-10-CM | POA: Diagnosis not present

## 2016-06-25 DIAGNOSIS — G4701 Insomnia due to medical condition: Secondary | ICD-10-CM | POA: Diagnosis not present

## 2016-06-25 LAB — LIPID PANEL
CHOLESTEROL: 148 mg/dL (ref 0–200)
HDL: 49.7 mg/dL (ref >39.00)
LDL CALC: 67 mg/dL (ref 0–99)
NonHDL: 98.4
TRIGLYCERIDES: 159 mg/dL — AB (ref 0.0–149.0)
Total CHOL/HDL Ratio: 3
VLDL: 31.8 mg/dL (ref 0.0–40.0)

## 2016-06-25 LAB — COMPREHENSIVE METABOLIC PANEL
ALK PHOS: 60 U/L (ref 39–117)
ALT: 21 U/L (ref 0–35)
AST: 28 U/L (ref 0–37)
Albumin: 4.3 g/dL (ref 3.5–5.2)
BUN: 19 mg/dL (ref 6–23)
CALCIUM: 9.7 mg/dL (ref 8.4–10.5)
CO2: 27 mEq/L (ref 19–32)
Chloride: 106 mEq/L (ref 96–112)
Creatinine, Ser: 1.02 mg/dL (ref 0.40–1.20)
GFR: 57.47 mL/min — AB (ref >60.00)
GLUCOSE: 95 mg/dL (ref 70–99)
POTASSIUM: 4.2 meq/L (ref 3.5–5.1)
Sodium: 140 mEq/L (ref 135–145)
TOTAL PROTEIN: 7.2 g/dL (ref 6.0–8.3)
Total Bilirubin: 0.5 mg/dL (ref 0.2–1.2)

## 2016-06-25 LAB — HEPATITIS A ANTIBODY, IGM: HEP A IGM: NONREACTIVE

## 2016-06-25 LAB — VITAMIN D 25 HYDROXY (VIT D DEFICIENCY, FRACTURES): VITD: 39.45 ng/mL (ref 30.00–100.00)

## 2016-06-25 LAB — HEPATITIS B SURFACE ANTIBODY,QUALITATIVE: HEP B S AB: POSITIVE — AB

## 2016-06-25 MED ORDER — GABAPENTIN 100 MG PO CAPS: 100.0000 mg | ORAL_CAPSULE | Freq: Three times a day (TID) | ORAL | 3 refills | Status: DC

## 2016-06-25 NOTE — Patient Instructions (Addendum)
Allegra comes in 60 and 180 mg doses,  Once daily  (fexofenadine)  continue flonase  We can add singulair if needed to control your allergies .   fOR YOUR NEUROPATHY:   I am prescribing gabapentin to help with your nerve pain.  You can start with 100 mg at bedtime  and either increase the dose at bedtime up to 300 mg gradually OR:  you can add a morning and afternoon dose if needed  If you tolerate this medication and find that it helps your pain , we can increase the dose gradually as needed , up to 2400 mg daily    The most common side effects are fluid retention and dizziness (but usually only at higher doses)

## 2016-06-25 NOTE — Progress Notes (Signed)
Subjective:  Patient ID: Jillian [name], female    DOB: 1951-02-17  Age: 66 y.o. MRN: [id number]  CC: The primary encounter diagnosis was Mixed hyperlipidemia. Diagnoses of Essential hypertension, Vitamin D deficiency, Decreased GFR, Need for hepatitis B screening test, Seasonal allergic rhinitis due to pollen, Insomnia due to medical condition, Class 1 obesity due to excess calories without serious comorbidity with body mass index (BMI) of 32.0 to 32.9 in adult, and Atherosclerosis of aorta (HCC) were also pertinent to this visit.  HPI Jillian [name] presents for 6 month  follow up on hypertension, hyperlipidemia. and GERD.  Last seen on March 28, treated for bronchitis   Having symptoms attributed to seasonal allergies  , sneezing,  Coughing,  Works in the yard a lot.  Voice a little hoarse.  Using flonase,  No headaches,  Fevers,  Purulent drainage or sputum. No recent ravel or sick contacts.   Had CT scan of abdomen and pelvis  last month for evalution of diarrhea sugggestive of colitis.  Diverticulosis without inflammation was noted.   Atherosclerosis or aorta and coronary arteries was also noted.  She has been taking  And tolerating statin for over 6 months.       BP has been at or below 130/80 by home measurements.  Tolerating medications Trying to lose weight . Fatty liver diagnosis discussed  Tobacco abuse history: Quit smoking at age 66  Not sleeping well due to chronic peripheral neuropathy and history of working 3rd shift for years.     Outpatient Medications Prior to Visit  Medication Sig Dispense Refill  . aspirin 81 MG tablet Take 81 mg by mouth daily.      . benzonatate (TESSALON) 200 MG capsule Take 1 capsule (200 mg total) by mouth 3 (three) times daily as needed for cough. 60 capsule 1  . Cholecalciferol (VITAMIN D3) 1000 UNITS CAPS Take 1 capsule by mouth daily.      . fluticasone (FLONASE) 50 MCG/ACT nasal spray Place 2 sprays into the nose daily. 16 g 5    . HYDROcodone-acetaminophen (NORCO/VICODIN) 5-325 MG tablet Take 1 tablet by mouth every 6 (six) hours as needed for moderate pain. 30 tablet 0  . ibuprofen (ADVIL,MOTRIN) 200 MG tablet Take 400 mg by mouth every 6 (six) hours as needed.      . Lactulose 20 GM/30ML SOLN 30 ml every 4 hours until constipation is relieved 236 mL 3  . loratadine (CLARITIN) 10 MG tablet Take 10 mg by mouth daily.    . magnesium oxide (MAG-OX) 400 MG tablet Take 400 mg by mouth daily.      . Melatonin 5 MG CAPS Take 1 capsule by mouth at bedtime as needed.    Marland Kitchen omeprazole (PRILOSEC) 20 MG capsule Take 1 capsule (20 mg total) by mouth daily as needed. 90 capsule 3  . simvastatin (ZOCOR) 40 MG tablet TAKE 1 TABLET BY MOUTH EVERY NIGHT AT BEDTIME 90 tablet 0  . zoster vaccine live, PF, (ZOSTAVAX) 99833 UNT/0.65ML injection Inject 19,400 Units into the skin once. 1 each 0  . zoster vaccine live, PF, (ZOSTAVAX) 82505 UNT/0.65ML injection Inject 19,400 Units into the skin once. 1 each 0  . ALPRAZolam (XANAX) 0.25 MG tablet Take 1 tablet (0.25 mg total) by mouth at bedtime as needed for sleep. 30 tablet 2  . amLODipine (NORVASC) 2.5 MG tablet TAKE 1 TABLET DAILY 30 tablet 2  . enalapril (VASOTEC) 20 MG tablet TAKE 1 TABLET TWICE DAILY 60 tablet  1  . guaiFENesin-codeine (CHERATUSSIN AC) 100-10 MG/5ML syrup Take 10 mLs by mouth at bedtime and may repeat dose one time if needed. 118 mL 0  . amoxicillin (AMOXIL) 500 MG capsule Take 1 capsule (500 mg total) by mouth 2 (two) times daily. (Patient not taking: Reported on 06/25/2016) 14 capsule 0  . predniSONE (DELTASONE) 10 MG tablet 6 tablets on Day 1 , then reduce by 1 tablet daily until gone (Patient not taking: Reported on 06/25/2016) 21 tablet 0   No facility-administered medications prior to visit.     Review of Systems;  Patient denies headache, fevers, malaise, unintentional weight loss, skin rash, eye pain, sinus congestion and sinus pain, sore throat, dysphagia,   hemoptysis ,, dyspnea, wheezing, chest pain, palpitations, orthopnea, edema, abdominal pain, nausea, melena, diarrhea, constipation, flank pain, dysuria, hematuria, urinary  Frequency, nocturia, numbness,  seizures,  Focal weakness, Loss of consciousness,  Tremor,, depression, anxiety, and suicidal ideation.      Objective:  BP 100/68 (BP Location: Left Arm, Patient Position: Sitting, Cuff Size: Large)   Pulse 68   Temp 98.2 F (36.8 C) (Oral)   Resp 15   Ht 5\' 6"  (1.676 m)   Wt 198 lb 9.6 oz (90.1 kg)   SpO2 95%   BMI 32.05 kg/m   BP Readings from Last 3 Encounters:  06/25/16 100/68  06/07/16 122/68  05/21/16 130/82    Wt Readings from Last 3 Encounters:  06/25/16 198 lb 9.6 oz (90.1 kg)  06/07/16 199 lb 12.8 oz (90.6 kg)  05/21/16 203 lb 6 oz (92.3 kg)    General appearance: alert, cooperative and appears stated age Ears: normal TM's and external ear canals both ears Throat: lips, mucosa, and tongue normal; teeth and gums normal Neck: no adenopathy, no carotid bruit, supple, symmetrical, trachea midline and thyroid not enlarged, symmetric, no tenderness/mass/nodules Back: symmetric, no curvature. ROM normal. No CVA tenderness. Lungs: clear to auscultation bilaterally Heart: regular rate and rhythm, S1, S2 normal, no murmur, click, rub or gallop Abdomen: soft, non-tender; bowel sounds normal; no masses,  no organomegaly Pulses: 2+ and symmetric Skin: Skin color, texture, turgor normal. No rashes or lesions Lymph nodes: Cervical, supraclavicular, and axillary nodes normal.  No results found for: HGBA1C  Lab Results  Component Value Date   CREATININE 1.02 06/25/2016   CREATININE 1.20 (H) 06/11/2016   CREATININE 1.24 (H) 06/11/2016    Lab Results  Component Value Date   WBC 7.3 06/11/2016   HGB 13.3 06/11/2016   HCT 39.6 06/11/2016   PLT 256.0 06/11/2016   GLUCOSE 95 06/25/2016   CHOL 148 06/25/2016   TRIG 159.0 (H) 06/25/2016   HDL 49.70 06/25/2016    LDLDIRECT 85.0 05/30/2015   LDLCALC 67 06/25/2016   ALT 21 06/25/2016   AST 28 06/25/2016   NA 140 06/25/2016   K 4.2 06/25/2016   CL 106 06/25/2016   CREATININE 1.02 06/25/2016   BUN 19 06/25/2016   CO2 27 06/25/2016   TSH 1.37 12/27/2015    Ct Abdomen Pelvis W Contrast  Result Date: 06/11/2016 CLINICAL DATA:  66 year old female with history of nausea and diarrhea for 1 month. History of right lower quadrant tenderness. EXAM: CT ABDOMEN AND PELVIS WITH CONTRAST TECHNIQUE: Multidetector CT imaging of the abdomen and pelvis was performed using the standard protocol following bolus administration of intravenous contrast. CONTRAST:  121mL ISOVUE-300 IOPAMIDOL (ISOVUE-300) INJECTION 61% COMPARISON:  No priors. FINDINGS: Lower chest: Atherosclerotic calcifications in the left anterior descending coronary artery.  Mild scarring in the medial aspect of the right lower lobe. Aortic atherosclerosis. Hepatobiliary: Diffuse low attenuation throughout the hepatic parenchyma, suggestive of hepatic steatosis (difficult to say for certain on today's noncontrast CT examination). No cystic or solid hepatic lesions. No intra or extrahepatic biliary ductal dilatation. Gallbladder is normal in appearance. Pancreas: No pancreatic mass. No pancreatic ductal dilatation. No pancreatic or peripancreatic fluid or inflammatory changes. Spleen: Unremarkable. Adrenals/Urinary Tract: Multiple subcentimeter low-attenuation lesions in both kidneys are too small to definitively characterize, but are statistically favored to represent tiny cysts. Bilateral adrenal glands are normal in appearance. No hydroureteronephrosis. Urinary bladder is normal in appearance. Stomach/Bowel: Normal appearance of the stomach. No pathologic dilatation of small bowel or colon. Numerous colonic diverticulae are noted, most evident in the sigmoid colon, without surrounding inflammatory changes to suggest an acute diverticulitis at this time. The appendix  is not confidently identified and may be surgically absent. Regardless, there are no inflammatory changes noted adjacent to the cecum to suggest the presence of an acute appendicitis at this time. Vascular/Lymphatic: Aortic atherosclerosis, without evidence of aneurysm or dissection in the abdominal or pelvic vasculature. No lymphadenopathy noted in the abdomen or pelvis. Reproductive: Uterus and ovaries are atrophic. Other: No significant volume of ascites.  No pneumoperitoneum. Musculoskeletal: There are no aggressive appearing lytic or blastic lesions noted in the visualized portions of the skeleton. IMPRESSION: 1. No acute findings are noted in the abdomen or pelvis to account for the patient's symptoms. 2. Colonic diverticulosis without evidence of acute diverticulitis at this time. 3. Probable hepatic steatosis. 4. Aortic atherosclerosis, in addition to left anterior descending coronary artery disease. Please note that although the presence of coronary artery calcium documents the presence of coronary artery disease, the severity of this disease and any potential stenosis cannot be assessed on this non-gated CT examination. Assessment for potential risk factor modification, dietary therapy or pharmacologic therapy may be warranted, if clinically indicated. Electronically Signed   By: Vinnie Langton M.D.   On: 06/11/2016 12:30    Assessment & Plan:   Problem List Items Addressed This Visit    Allergic rhinitis due to pollen    Continue steroid nasal spray .  Recommend chaning from claritin to altnerative oral antihistamine for seasonal allergies that are now available otc. She can try  allegra (fexofenadine 180 mg daily), zyrtec (cetirizine 10 mg daily) ,..  Will add singulair if no improvement       Atherosclerosis of aorta (HCC)    Noted on CT scan.  Addressed with patient.  Continue statin and Low dose asa.       Relevant Medications   amLODipine (NORVASC) 2.5 MG tablet   Hyperlipidemia -  Primary    LDL has been at goal on current medications. She has no side effects , and LFTs are normal . No changes today  Lab Results  Component Value Date   CHOL 148 06/25/2016   HDL 49.70 06/25/2016   LDLCALC 67 06/25/2016   LDLDIRECT 85.0 05/30/2015   TRIG 159.0 (H) 06/25/2016   CHOLHDL 3 06/25/2016    Lab Results  Component Value Date   ALT 21 06/25/2016   AST 28 06/25/2016   ALKPHOS 60 06/25/2016   BILITOT 0.5 06/25/2016           Relevant Medications   amLODipine (NORVASC) 2.5 MG tablet   Other Relevant Orders   Lipid panel (Completed)   Hypertension    Well controlled on current regimen. Renal function stable, no  changes today.  Lab Results  Component Value Date   CREATININE 1.02 06/25/2016   Lab Results  Component Value Date   NA 140 06/25/2016   K 4.2 06/25/2016   CL 106 06/25/2016   CO2 27 06/25/2016         Relevant Medications   amLODipine (NORVASC) 2.5 MG tablet   Other Relevant Orders   Comprehensive metabolic panel (Completed)   Insomnia    Multifactorial. Previously managed with prn alprazolam.   Will address neuropathy with trial of gabapentin .  The risks and benefits of benzodiazepine use were reviewed with patient today including excessive sedation leading to respiratory depression,  impaired thinking/driving, and addiction.  Patient was advised to avoid concurrent use with alcohol, to use medication only as needed and not to share with others  .       Obesity    I have congratulated her in reduction of  Weight and encouraged  Continued weight loss with goal of 10% of body weight over the next 6 months using a low glycemic index diet and regular exercise a minimum of 5 days per week.         Other Visit Diagnoses    Vitamin D deficiency       Relevant Orders   VITAMIN D 25 Hydroxy (Vit-D Deficiency, Fractures) (Completed)   Decreased GFR       Relevant Orders   Comprehensive metabolic panel (Completed)   Need for hepatitis B  screening test       Relevant Orders   Hepatitis B surface antibody (Completed)   Hepatitis A antibody, IgM (Completed)     A total of 25 minutes of face to face time was spent with patient more than half of which was spent in counselling about the above mentioned conditions  and coordination of care  I have discontinued Ms. Baise's ALPRAZolam, predniSONE, guaiFENesin-codeine, and amoxicillin. I am also having her start on gabapentin. Additionally, I am having her maintain her Vitamin D3, magnesium oxide, ibuprofen, aspirin, omeprazole, loratadine, zoster vaccine live (PF), Melatonin, zoster vaccine live (PF), HYDROcodone-acetaminophen, fluticasone, simvastatin, benzonatate, Lactulose, and amLODipine.  Meds ordered this encounter  Medications  . amLODipine (NORVASC) 2.5 MG tablet  . gabapentin (NEURONTIN) 100 MG capsule    Sig: Take 1 capsule (100 mg total) by mouth 3 (three) times daily.    Dispense:  90 capsule    Refill:  3    Medications Discontinued During This Encounter  Medication Reason  . amLODipine (NORVASC) 2.5 MG tablet Therapy completed  . amoxicillin (AMOXIL) 500 MG capsule Therapy completed  . predniSONE (DELTASONE) 10 MG tablet Therapy completed  . ALPRAZolam (XANAX) 0.25 MG tablet   . guaiFENesin-codeine (CHERATUSSIN AC) 100-10 MG/5ML syrup     Follow-up: No Follow-up on file.   Crecencio Mc, MD

## 2016-06-25 NOTE — Progress Notes (Signed)
Pre visit review using our clinic review tool, if applicable. No additional management support is needed unless otherwise documented below in the visit note. 

## 2016-06-26 DIAGNOSIS — I700 Atherosclerosis of aorta: Secondary | ICD-10-CM | POA: Insufficient documentation

## 2016-06-26 NOTE — Assessment & Plan Note (Signed)
Well controlled on current regimen. Renal function stable, no changes today.  Lab Results  Component Value Date   CREATININE 1.02 06/25/2016   Lab Results  Component Value Date   NA 140 06/25/2016   K 4.2 06/25/2016   CL 106 06/25/2016   CO2 27 06/25/2016

## 2016-06-26 NOTE — Assessment & Plan Note (Addendum)
Continue steroid nasal spray .  Recommend chaning from claritin to altnerative oral antihistamine for seasonal allergies that are now available otc. She can try  allegra (fexofenadine 180 mg daily), zyrtec (cetirizine 10 mg daily) ,..  Will add singulair if no improvement

## 2016-06-26 NOTE — Assessment & Plan Note (Addendum)
Multifactorial. Previously managed with prn alprazolam.   Will address neuropathy with trial of gabapentin .  The risks and benefits of benzodiazepine use were reviewed with patient today including excessive sedation leading to respiratory depression,  impaired thinking/driving, and addiction.  Patient was advised to avoid concurrent use with alcohol, to use medication only as needed and not to share with others  .

## 2016-06-26 NOTE — Assessment & Plan Note (Signed)
Noted on CT scan.  Addressed with patient.  Continue statin and Low dose asa.

## 2016-06-26 NOTE — Assessment & Plan Note (Signed)
I have congratulated her in reduction of  Weight and encouraged  Continued weight loss with goal of 10% of body weight over the next 6 months using a low glycemic index diet and regular exercise a minimum of 5 days per week.

## 2016-06-26 NOTE — Assessment & Plan Note (Signed)
LDL has been at goal on current medications. She has no side effects , and LFTs are normal . No changes today  Lab Results  Component Value Date   CHOL 148 06/25/2016   HDL 49.70 06/25/2016   LDLCALC 67 06/25/2016   LDLDIRECT 85.0 05/30/2015   TRIG 159.0 (H) 06/25/2016   CHOLHDL 3 06/25/2016    Lab Results  Component Value Date   ALT 21 06/25/2016   AST 28 06/25/2016   ALKPHOS 60 06/25/2016   BILITOT 0.5 06/25/2016

## 2016-06-27 ENCOUNTER — Encounter: Payer: Self-pay | Admitting: Internal Medicine

## 2016-07-24 ENCOUNTER — Other Ambulatory Visit: Payer: Self-pay | Admitting: Internal Medicine

## 2016-07-26 ENCOUNTER — Other Ambulatory Visit: Payer: Self-pay | Admitting: Internal Medicine

## 2016-07-26 NOTE — Telephone Encounter (Signed)
Do not see where you have given in the past listed as historical provider. Ok to refill?

## 2016-07-27 NOTE — Telephone Encounter (Signed)
refilled 

## 2016-08-22 ENCOUNTER — Other Ambulatory Visit: Payer: Self-pay | Admitting: Internal Medicine

## 2016-09-20 ENCOUNTER — Other Ambulatory Visit: Payer: Self-pay | Admitting: Internal Medicine

## 2016-10-10 DIAGNOSIS — D229 Melanocytic nevi, unspecified: Secondary | ICD-10-CM | POA: Diagnosis not present

## 2016-10-10 DIAGNOSIS — Z85828 Personal history of other malignant neoplasm of skin: Secondary | ICD-10-CM | POA: Diagnosis not present

## 2016-10-22 ENCOUNTER — Other Ambulatory Visit: Payer: Self-pay | Admitting: Internal Medicine

## 2016-10-26 ENCOUNTER — Other Ambulatory Visit: Payer: Self-pay | Admitting: Internal Medicine

## 2016-12-10 ENCOUNTER — Ambulatory Visit (INDEPENDENT_AMBULATORY_CARE_PROVIDER_SITE_OTHER): Payer: Medicare Other | Admitting: Family Medicine

## 2016-12-10 ENCOUNTER — Encounter: Payer: Self-pay | Admitting: Family Medicine

## 2016-12-10 DIAGNOSIS — M545 Low back pain: Secondary | ICD-10-CM

## 2016-12-10 DIAGNOSIS — J069 Acute upper respiratory infection, unspecified: Secondary | ICD-10-CM | POA: Diagnosis not present

## 2016-12-10 DIAGNOSIS — G8929 Other chronic pain: Secondary | ICD-10-CM

## 2016-12-10 MED ORDER — GUAIFENESIN ER 600 MG PO TB12: 600.0000 mg | ORAL_TABLET | Freq: Two times a day (BID) | ORAL | Status: DC

## 2016-12-10 NOTE — Progress Notes (Signed)
  Tommi Rumps, MD Phone: [phone]  Jillian [name] is a 66 y.o. female who presents today for follow-up.  Patient notes for the last 2 days she's had sore throat with sinus congestion and postnasal drip. Some nausea though no vomiting or diarrhea. No abdominal pain. Notes right ear is been bothering her. She's had a few chills but no fevers. Some mild cough that is nonproductive. Using nasal saline, Flonase, and Claritin.  Patient notes chronic low back pain though she did have some thoracic back pain that was new recently. This was a soreness in the muscles. Did not radiate. No incontinence. Started after moving furniture. She notes she stopped the gabapentin and this went away. No pain over the last several days.  PMH: Former smoker   ROS see history of present illness  Objective  Physical Exam Vitals:   12/10/16 1059  BP: 118/80  Pulse: 60  Temp: 98.2 F (36.8 C)  SpO2: 96%    BP Readings from Last 3 Encounters:  12/10/16 118/80  06/25/16 100/68  06/07/16 122/68   Wt Readings from Last 3 Encounters:  12/10/16 198 lb 12.8 oz (90.2 kg)  06/25/16 198 lb 9.6 oz (90.1 kg)  06/07/16 199 lb 12.8 oz (90.6 kg)    Physical Exam  Constitutional: No distress.  HENT:  Head: Normocephalic and atraumatic.  Mouth/Throat: Oropharynx is clear and moist. No oropharyngeal exudate.  Normal TMs bilaterally  Eyes: Pupils are equal, round, and reactive to light. Conjunctivae are normal.  Neck: Neck supple.  Cardiovascular: Normal rate, regular rhythm and normal heart sounds.   Pulmonary/Chest: Effort normal and breath sounds normal.  Musculoskeletal:  No midline spine tenderness, no midline spine step-off, no muscular back tenderness, stands from seated position easily  Lymphadenopathy:    She has no cervical adenopathy.  Neurological: She is alert. Gait normal.  Skin: She is not diaphoretic.     Assessment/Plan: Please see individual problem list.  Viral  URI Symptoms most consistent with viral URI. Discussed supportive care. Continue Flonase and Claritin. Could add Mucinex if needed. If not improving by Friday she will contact us.  Lumbago Chronic intermittent issue with low back pain. Recently had some thoracic back discomfort though this has resolved with discontinuing gabapentin. She does report the gabapentin made her drowsy the next day. Advised not to take this and to monitor for recurrence of discomfort.  Tommi Rumps, MD [place]

## 2016-12-10 NOTE — Assessment & Plan Note (Signed)
Chronic intermittent issue with low back pain. Recently had some thoracic back discomfort though this has resolved with discontinuing gabapentin. She does report the gabapentin made her drowsy the next day. Advised not to take this and to monitor for recurrence of discomfort.

## 2016-12-10 NOTE — Assessment & Plan Note (Signed)
Symptoms most consistent with viral URI. Discussed supportive care. Continue Flonase and Claritin. Could add Mucinex if needed. If not improving by Friday she will contact us.

## 2016-12-10 NOTE — Patient Instructions (Addendum)
Nice to see you. You likely have a viral illness. You should continue the saline, Flonase, Claritin. You could add Mucinex if needed. If your symptoms worsen or do not improve please let us know.

## 2016-12-23 ENCOUNTER — Other Ambulatory Visit: Payer: Self-pay | Admitting: Internal Medicine

## 2016-12-24 ENCOUNTER — Ambulatory Visit: Payer: Medicare Other | Admitting: Internal Medicine

## 2017-01-08 ENCOUNTER — Other Ambulatory Visit: Payer: Self-pay | Admitting: Internal Medicine

## 2017-01-08 DIAGNOSIS — Z1231 Encounter for screening mammogram for malignant neoplasm of breast: Secondary | ICD-10-CM

## 2017-01-09 ENCOUNTER — Encounter: Payer: Self-pay | Admitting: Internal Medicine

## 2017-01-09 ENCOUNTER — Ambulatory Visit (INDEPENDENT_AMBULATORY_CARE_PROVIDER_SITE_OTHER): Payer: Medicare Other | Admitting: Internal Medicine

## 2017-01-09 VITALS — BP 124/68 | HR 63 | Temp 98.1°F | Resp 15 | Ht 66.0 in | Wt 201.2 lb

## 2017-01-09 DIAGNOSIS — I10 Essential (primary) hypertension: Secondary | ICD-10-CM

## 2017-01-09 DIAGNOSIS — K760 Fatty (change of) liver, not elsewhere classified: Secondary | ICD-10-CM | POA: Diagnosis not present

## 2017-01-09 DIAGNOSIS — R7301 Impaired fasting glucose: Secondary | ICD-10-CM | POA: Diagnosis not present

## 2017-01-09 DIAGNOSIS — Z6832 Body mass index (BMI) 32.0-32.9, adult: Secondary | ICD-10-CM

## 2017-01-09 DIAGNOSIS — Z23 Encounter for immunization: Secondary | ICD-10-CM

## 2017-01-09 DIAGNOSIS — E6609 Other obesity due to excess calories: Secondary | ICD-10-CM | POA: Diagnosis not present

## 2017-01-09 DIAGNOSIS — E782 Mixed hyperlipidemia: Secondary | ICD-10-CM | POA: Diagnosis not present

## 2017-01-09 DIAGNOSIS — I2510 Atherosclerotic heart disease of native coronary artery without angina pectoris: Secondary | ICD-10-CM

## 2017-01-09 LAB — COMPREHENSIVE METABOLIC PANEL
ALBUMIN: 4.3 g/dL (ref 3.5–5.2)
ALT: 15 U/L (ref 0–35)
AST: 22 U/L (ref 0–37)
Alkaline Phosphatase: 55 U/L (ref 39–117)
BUN: 19 mg/dL (ref 6–23)
CALCIUM: 9.7 mg/dL (ref 8.4–10.5)
CO2: 28 meq/L (ref 19–32)
CREATININE: 1.06 mg/dL (ref 0.40–1.20)
Chloride: 105 mEq/L (ref 96–112)
GFR: 54.88 mL/min — ABNORMAL LOW (ref >60.00)
Glucose, Bld: 93 mg/dL (ref 70–99)
Potassium: 4.6 mEq/L (ref 3.5–5.1)
Sodium: 140 mEq/L (ref 135–145)
Total Bilirubin: 0.4 mg/dL (ref 0.2–1.2)
Total Protein: 7.4 g/dL (ref 6.0–8.3)

## 2017-01-09 LAB — HEMOGLOBIN A1C: Hgb A1c MFr Bld: 5.7 % (ref 4.6–6.5)

## 2017-01-09 NOTE — Progress Notes (Signed)
Subjective:  Patient ID: Jillian [name], female    DOB: 06-09-1950  Age: 66 y.o. MRN: [id number]  CC: The primary encounter diagnosis was Essential hypertension. Diagnoses of Impaired fasting glucose, Need for hepatitis A and B vaccination, Mixed hyperlipidemia, Class 1 obesity due to excess calories without serious comorbidity with body mass index (BMI) of 32.0 to 32.9 in adult, Atherosclerosis of native coronary artery of native heart without angina pectoris, and Hepatic steatosis were also pertinent to this visit.  HPI DAILY DOE presents for follow up on hyperlipidemia, hypertension and obesity   Frustrated at  Her inability to lose weight .  Diet reviewed in detail.  Eating only twice daily some days,  Others 3.  No snacks.  Low glycemic 95% of the time.  Not exercising ,  walks 3 times per week   Hypertension: patient checks blood pressure twice weekly at home.  Readings have been for the most part < 140/80 at rest . Patient is following a reduce salt diet most days and is taking medications as prescribed.   Outpatient Medications Prior to Visit  Medication Sig Dispense Refill  . amLODipine (NORVASC) 2.5 MG tablet TAKE 1 TABLET DAILY 30 tablet 3  . aspirin 81 MG tablet Take 81 mg by mouth daily.      . Cholecalciferol (VITAMIN D3) 1000 UNITS CAPS Take 1 capsule by mouth daily.      . enalapril (VASOTEC) 20 MG tablet TAKE ONE TABLET BY MOUTH TWICE A DAY 60 tablet 3  . fluticasone (FLONASE) 50 MCG/ACT nasal spray PLACE 2 SPRAYS INTO THE NOSE DAILY. 16 g 4  . ibuprofen (ADVIL,MOTRIN) 200 MG tablet Take 400 mg by mouth every 6 (six) hours as needed.      . Lactulose 20 GM/30ML SOLN 30 ml every 4 hours until constipation is relieved 236 mL 3  . omeprazole (PRILOSEC) 20 MG capsule Take 1 capsule (20 mg total) by mouth daily as needed. 90 capsule 3  . simvastatin (ZOCOR) 40 MG tablet TAKE 1 TABLET BY MOUTH EVERY NIGHT AT BEDTIME 30 tablet 0  . benzonatate (TESSALON) 200 MG  capsule Take 1 capsule (200 mg total) by mouth 3 (three) times daily as needed for cough. (Patient not taking: Reported on 01/09/2017) 60 capsule 1  . HYDROcodone-acetaminophen (NORCO/VICODIN) 5-325 MG tablet Take 1 tablet by mouth every 6 (six) hours as needed for moderate pain. (Patient not taking: Reported on 01/09/2017) 30 tablet 0  . loratadine (CLARITIN) 10 MG tablet Take 10 mg by mouth daily.    . magnesium oxide (MAG-OX) 400 MG tablet Take 400 mg by mouth daily.      Marland Kitchen zoster vaccine live, PF, (ZOSTAVAX) 44818 UNT/0.65ML injection Inject 19,400 Units into the skin once. (Patient not taking: Reported on 01/09/2017) 1 each 0  . zoster vaccine live, PF, (ZOSTAVAX) [id number] UNT/0.65ML injection Inject 19,400 Units into the skin once. (Patient not taking: Reported on 01/09/2017) 1 each 0   No facility-administered medications prior to visit.     Review of Systems;  Patient denies headache, fevers, malaise, unintentional weight loss, skin rash, eye pain, sinus congestion and sinus pain, sore throat, dysphagia,  hemoptysis , cough, dyspnea, wheezing, chest pain, palpitations, orthopnea, edema, abdominal pain, nausea, melena, diarrhea, constipation, flank pain, dysuria, hematuria, urinary  Frequency, nocturia, numbness, tingling, seizures,  Focal weakness, Loss of consciousness,  Tremor, insomnia, depression, anxiety, and suicidal ideation.      Objective:  BP 124/68 (BP Location: Left Arm, Patient Position:  Sitting, Cuff Size: Large)   Pulse 63   Temp 98.1 F (36.7 C) (Oral)   Resp 15   Ht 5\' 6"  (1.676 m)   Wt 201 lb 3.2 oz (91.3 kg)   SpO2 96%   BMI 32.47 kg/m   BP Readings from Last 3 Encounters:  01/09/17 124/68  12/10/16 118/80  06/25/16 100/68    Wt Readings from Last 3 Encounters:  01/09/17 201 lb 3.2 oz (91.3 kg)  12/10/16 198 lb 12.8 oz (90.2 kg)  06/25/16 198 lb 9.6 oz (90.1 kg)    General appearance: alert, cooperative and appears stated age Ears: normal TM's and  external ear canals both ears Throat: lips, mucosa, and tongue normal; teeth and gums normal Neck: no adenopathy, no carotid bruit, supple, symmetrical, trachea midline and thyroid not enlarged, symmetric, no tenderness/mass/nodules Back: symmetric, no curvature. ROM normal. No CVA tenderness. Lungs: clear to auscultation bilaterally Heart: regular rate and rhythm, S1, S2 normal, no murmur, click, rub or gallop Abdomen: soft, non-tender; bowel sounds normal; no masses,  no organomegaly Pulses: 2+ and symmetric Skin: Skin color, texture, turgor normal. No rashes or lesions Lymph nodes: Cervical, supraclavicular, and axillary nodes normal.  Lab Results  Component Value Date   HGBA1C 5.7 01/09/2017    Lab Results  Component Value Date   CREATININE 1.06 01/09/2017   CREATININE 1.02 06/25/2016   CREATININE 1.20 (H) 06/11/2016    Lab Results  Component Value Date   WBC 7.3 06/11/2016   HGB 13.3 06/11/2016   HCT 39.6 06/11/2016   PLT 256.0 06/11/2016   GLUCOSE 93 01/09/2017   CHOL 148 06/25/2016   TRIG 159.0 (H) 06/25/2016   HDL 49.70 06/25/2016   LDLDIRECT 85.0 05/30/2015   LDLCALC 67 06/25/2016   ALT 15 01/09/2017   AST 22 01/09/2017   NA 140 01/09/2017   K 4.6 01/09/2017   CL 105 01/09/2017   CREATININE 1.06 01/09/2017   BUN 19 01/09/2017   CO2 28 01/09/2017   TSH 1.37 12/27/2015   HGBA1C 5.7 01/09/2017     Assessment & Plan:   Problem List Items Addressed This Visit    Atherosclerosis of coronary artery    LAD, noted on CT abd done in April.  Continue statin , aspirin and ACE inhibitor.       Hepatic steatosis    Suggested by appearance of liver on April non contrasted CT.  Continue statin, lifestyle mofications and weight loss      Hyperlipidemia    LDL has been at goal on current medications by last fasting panel in April .Marland Kitchen She has no side effects , and LFTs are normal . No changes today  Lab Results  Component Value Date   CHOL 148 06/25/2016   HDL  49.70 06/25/2016   LDLCALC 67 06/25/2016   LDLDIRECT 85.0 05/30/2015   TRIG 159.0 (H) 06/25/2016   CHOLHDL 3 06/25/2016    Lab Results  Component Value Date   ALT 15 01/09/2017   AST 22 01/09/2017   ALKPHOS 55 01/09/2017   BILITOT 0.4 01/09/2017           Hypertension - Primary    Well controlled on current regimen. Renal function stable, no changes today.  Lab Results  Component Value Date   CREATININE 1.06 01/09/2017   Lab Results  Component Value Date   NA 140 01/09/2017   K 4.6 01/09/2017   CL 105 01/09/2017   CO2 28 01/09/2017  Relevant Orders   Comprehensive metabolic panel (Completed)   Obesity    I have addressed  BMI and recommended a low glycemic index diet utilizing smaller more frequent meals to increase metabolism.  I have also recommended that patient start exercising with a goal of 30 minutes of aerobic exercise a minimum of 5 days per week. Screening for lipid disorders, thyroid and diabetes has been done . Lab Results  Component Value Date   TSH 1.37 12/27/2015   Lab Results  Component Value Date   HGBA1C 5.7 01/09/2017           Other Visit Diagnoses    Impaired fasting glucose       Relevant Orders   Hemoglobin A1c (Completed)   Need for hepatitis A and B vaccination       Relevant Orders   Hepatitis A hepatitis B combined vaccine IM (Completed)      I have discontinued Belenda Cruise A. Guinther's magnesium oxide, loratadine, zoster vaccine live (PF), zoster vaccine live (PF), HYDROcodone-acetaminophen, and benzonatate. I am also having her maintain her Vitamin D3, ibuprofen, aspirin, omeprazole, Lactulose, fluticasone, enalapril, amLODipine, and simvastatin.  No orders of the defined types were placed in this encounter.   Medications Discontinued During This Encounter  Medication Reason  . benzonatate (TESSALON) 200 MG capsule Patient has not taken in last 30 days  . HYDROcodone-acetaminophen (NORCO/VICODIN) 5-325 MG tablet  Completed Course  . loratadine (CLARITIN) 10 MG tablet Patient has not taken in last 30 days  . magnesium oxide (MAG-OX) 400 MG tablet Patient has not taken in last 30 days  . zoster vaccine live, PF, (ZOSTAVAX) [id number] UNT/0.65ML injection Patient has not taken in last 30 days  . zoster vaccine live, PF, (ZOSTAVAX) [id number] UNT/0.65ML injection Patient has not taken in last 30 days    Follow-up: Return in about 6 months (around 07/09/2017) for 1 month RN  visit for [place] .   Crecencio Mc, MD

## 2017-01-09 NOTE — Patient Instructions (Addendum)
You will have a much harder time losing weight if you do not eat MORE OFTEN  and exercise daily   Eat a 100 cal snack (MAKE IT 8g sugar or less  unless it's a piece of fruit,  Like AN apple , a bowl of cherries,  berries,  Strawberries ,  Or a single peach.    NO BANANAS)  100 CAL NUTS.   KIND BARs come in a  3 G SUGAR 100 CAL SNACK SIZE ([facility] AND [facility] HAVE THEM)   Another 100 CALORIE SNACK IN the AFTERNOON    You need 30 minutes of exercise at least 4 days per week .  No EXCUSES!!

## 2017-01-11 ENCOUNTER — Encounter: Payer: Self-pay | Admitting: Internal Medicine

## 2017-01-12 DIAGNOSIS — K760 Fatty (change of) liver, not elsewhere classified: Secondary | ICD-10-CM | POA: Insufficient documentation

## 2017-01-12 DIAGNOSIS — I2510 Atherosclerotic heart disease of native coronary artery without angina pectoris: Secondary | ICD-10-CM | POA: Insufficient documentation

## 2017-01-12 NOTE — Assessment & Plan Note (Signed)
LAD, noted on CT abd done in April.  Continue statin , aspirin and ACE inhibitor.

## 2017-01-12 NOTE — Assessment & Plan Note (Signed)
Well controlled on current regimen. Renal function stable, no changes today.  Lab Results  Component Value Date   CREATININE 1.06 01/09/2017   Lab Results  Component Value Date   NA 140 01/09/2017   K 4.6 01/09/2017   CL 105 01/09/2017   CO2 28 01/09/2017

## 2017-01-12 NOTE — Assessment & Plan Note (Signed)
Suggested by appearance of liver on April non contrasted CT.  Continue statin, lifestyle mofications and weight loss

## 2017-01-12 NOTE — Assessment & Plan Note (Signed)
LDL has been at goal on current medications by last fasting panel in April .Marland Kitchen She has no side effects , and LFTs are normal . No changes today  Lab Results  Component Value Date   CHOL 148 06/25/2016   HDL 49.70 06/25/2016   LDLCALC 67 06/25/2016   LDLDIRECT 85.0 05/30/2015   TRIG 159.0 (H) 06/25/2016   CHOLHDL 3 06/25/2016    Lab Results  Component Value Date   ALT 15 01/09/2017   AST 22 01/09/2017   ALKPHOS 55 01/09/2017   BILITOT 0.4 01/09/2017

## 2017-01-12 NOTE — Assessment & Plan Note (Signed)
I have addressed  BMI and recommended a low glycemic index diet utilizing smaller more frequent meals to increase metabolism.  I have also recommended that patient start exercising with a goal of 30 minutes of aerobic exercise a minimum of 5 days per week. Screening for lipid disorders, thyroid and diabetes has been done . Lab Results  Component Value Date   TSH 1.37 12/27/2015   Lab Results  Component Value Date   HGBA1C 5.7 01/09/2017

## 2017-01-20 ENCOUNTER — Other Ambulatory Visit: Payer: Self-pay | Admitting: Internal Medicine

## 2017-01-22 DIAGNOSIS — H2513 Age-related nuclear cataract, bilateral: Secondary | ICD-10-CM | POA: Diagnosis not present

## 2017-02-05 DIAGNOSIS — H2513 Age-related nuclear cataract, bilateral: Secondary | ICD-10-CM | POA: Diagnosis not present

## 2017-02-06 ENCOUNTER — Ambulatory Visit: Payer: Medicare Other

## 2017-02-07 ENCOUNTER — Ambulatory Visit
Admission: RE | Admit: 2017-02-07 | Discharge: 2017-02-07 | Disposition: A | Discharge status: Home / Self Care | Payer: Medicare Other | Source: Ambulatory Visit | Attending: Internal Medicine | Admitting: Internal Medicine

## 2017-02-07 DIAGNOSIS — Z1231 Encounter for screening mammogram for malignant neoplasm of breast: Secondary | ICD-10-CM | POA: Diagnosis not present

## 2017-02-14 ENCOUNTER — Ambulatory Visit: Payer: Medicare Other

## 2017-02-14 ENCOUNTER — Ambulatory Visit (INDEPENDENT_AMBULATORY_CARE_PROVIDER_SITE_OTHER): Payer: Medicare Other

## 2017-02-14 VITALS — BP 126/70 | HR 71 | Temp 98.4°F | Resp 15 | Ht 66.0 in | Wt 203.8 lb

## 2017-02-14 DIAGNOSIS — Z0000 Encounter for general adult medical examination without abnormal findings: Secondary | ICD-10-CM | POA: Diagnosis not present

## 2017-02-14 DIAGNOSIS — Z23 Encounter for immunization: Secondary | ICD-10-CM | POA: Diagnosis not present

## 2017-02-14 DIAGNOSIS — Z1331 Encounter for screening for depression: Secondary | ICD-10-CM

## 2017-02-14 NOTE — Patient Instructions (Addendum)
  Jillian [name] , Thank you for taking time to come for your Medicare Wellness Visit. I appreciate your ongoing commitment to your health goals. Please review the following plan we discussed and let me know if I can assist you in the future.   Follow up with Dr. Derrel Nip as needed.    Bring a copy of your [place] and/or Living Will to be scanned into chart.  Last Hep A/B vaccine to be administered on or around 07/09/17.  Have a great day and Merry Christmas!  These are the goals we discussed: Goals    . Increase physical activity     Water aerobics when possible Walk for exercise       This is a list of the screening recommended for you and due dates:  Health Maintenance  Topic Date Due  . Colon Cancer Screening  12/18/2018  . Mammogram  02/08/2019  . Flu Shot  Completed  . DEXA scan (bone density measurement)  Completed  .  Hepatitis C: One time screening is recommended by Center for Disease Control  (CDC) for  adults born from [birth year] through 1966.   Completed  . Pneumonia vaccines  Completed

## 2017-02-14 NOTE — Progress Notes (Signed)
Subjective:   Jillian [name] is a 66 y.o. female who presents for Medicare Annual (Subsequent) preventive examination.  Review of Systems:  No ROS.  Medicare Wellness Visit. Additional risk factors are reflected in the social history.  Cardiac Risk Factors include: advanced age (>26men, >74 women);hypertension;obesity (BMI >30kg/m2)     Objective:     Vitals: BP 126/70 (BP Location: Left Arm, Patient Position: Sitting, Cuff Size: Normal)   Pulse 71   Temp 98.4 F (36.9 C) (Oral)   Resp 15   Ht 5\' 6"  (1.676 m)   Wt 203 lb 12.8 oz (92.4 kg)   BMI 32.89 kg/m   Body mass index is 32.89 kg/m.  Advanced Directives 02/14/2017 02/07/2016  Does Patient Have a Medical Advance Directive? Yes Yes  Type of Advance Directive Living will;Healthcare Power of Attorney Living will  Does patient want to make changes to medical advance directive? No - Patient declined No - Patient declined  Copy of [place] in Chart? No - copy requested -    Tobacco Social History   Tobacco Use  Smoking Status Former Smoker  . Types: Cigarettes  . Last attempt to quit: 03/16/1999  . Years since quitting: 17.9  Smokeless Tobacco Never Used     Counseling given: Not Answered   Clinical Intake:  Pre-visit preparation completed: Yes  Pain : No/denies pain     Nutritional Status: BMI > 30  Obese Diabetes: No  How often do you need to have someone help you when you read instructions, pamphlets, or other written materials from your doctor or pharmacy?: 1 - Never  Interpreter Needed?: No     Past Medical History:  Diagnosis Date  . Allergy    seasonal rhinitis  . Arthritis    right shoulder,  no intervention  . Cancer ([place])    basal cell   . GERD (gastroesophageal reflux disease)   . Hyperlipidemia   . Hypertension   . Irritable bowel syndrome (IBS) Oct 2011   last colonoscopy showed diverticulum, no polyps   Past Surgical History:  Procedure Laterality  Date  . TONSILLECTOMY AND ADENOIDECTOMY  1956  . TUBAL LIGATION  1977   Family History  Problem Relation Age of Onset  . Hyperlipidemia Mother   . Hyperlipidemia Father   . Hypertension Father   . Hyperlipidemia Maternal Grandmother   . Hyperlipidemia Maternal Grandfather   . Cancer Cousin   . Breast cancer Neg Hx    Social History   Socioeconomic History  . Marital status: Divorced    Spouse name: None  . Number of children: None  . Years of education: None  . Highest education level: None  Social Needs  . Financial resource strain: None  . Food insecurity - worry: None  . Food insecurity - inability: None  . Transportation needs - medical: None  . Transportation needs - non-medical: None  Occupational History  . Occupation: cutsodial    Employer: [employer]    Comment: works at [employer]  . Smoking status: Former Smoker    Types: Cigarettes    Last attempt to quit: 03/16/1999    Years since quitting: 17.9  . Smokeless tobacco: Never Used  Substance and Sexual Activity  . Alcohol use: No    Alcohol/week: 0.0 oz  . Drug use: No  . Sexual activity: Not Currently  Other Topics Concern  . None  Social History Narrative  . None    Outpatient Encounter Medications  as of 02/14/2017  Medication Sig  . amLODipine (NORVASC) 2.5 MG tablet TAKE 1 TABLET DAILY  . aspirin 81 MG tablet Take 81 mg by mouth daily.    . Cholecalciferol (VITAMIN D3) 1000 UNITS CAPS Take 1 capsule by mouth daily.    . enalapril (VASOTEC) 20 MG tablet TAKE ONE TABLET BY MOUTH TWICE A DAY  . fluticasone (FLONASE) 50 MCG/ACT nasal spray PLACE 2 SPRAYS INTO THE NOSE DAILY.  Marland Kitchen ibuprofen (ADVIL,MOTRIN) 200 MG tablet Take 400 mg by mouth every 6 (six) hours as needed.    . Lactulose 20 GM/30ML SOLN 30 ml every 4 hours until constipation is relieved  . omeprazole (PRILOSEC) 20 MG capsule Take 1 capsule (20 mg total) by mouth daily as needed.  . simvastatin (ZOCOR) 40 MG tablet TAKE 1 TABLET BY MOUTH  EVERY NIGHT AT BEDTIME   No facility-administered encounter medications on file as of 02/14/2017.     Activities of Daily Living In your present state of health, do you have any difficulty performing the following activities: 02/14/2017  Hearing? N  Vision? Y  Comment Cataract surgery scheduled  Difficulty concentrating or making decisions? N  Walking or climbing stairs? Y  Comment chronic back pain  Dressing or bathing? N  Doing errands, shopping? N  Preparing Food and eating ? N  Using the Toilet? N  In the past six months, have you accidently leaked urine? N  Do you have problems with loss of bowel control? N  Managing your Medications? N  Managing your Finances? N  Housekeeping or managing your Housekeeping? N  Some recent data might be hidden    Patient Care Team: Crecencio Mc, MD as PCP - General (Internal Medicine)    Assessment:   This is a routine wellness examination for [facility]. The goal of the wellness visit is to assist the patient how to close the gaps in care and create a preventative care plan for the patient.   The roster of all physicians providing medical care to patient is listed in the Snapshot section of the chart.  Taking calcium VIT D as appropriate/Osteoporosis risk reviewed.    Safety issues reviewed; Smoke and carbon monoxide detectors in the home. No firearms in the home.  Wears seatbelts when driving or riding with others. Patient does wear sunscreen or protective clothing when in direct sunlight. No violence in the home.  Depression- PHQ 2 &9 complete.  No signs/symptoms or verbal communication regarding little pleasure in doing things, feeling down, depressed or hopeless. No changes in sleeping, energy, eating, concentrating.  No thoughts of self harm or harm towards others.  Time spent on this topic is 15 minutes.   Patient is alert, normal appearance, oriented to person/place/and time.  Correctly identified the president of the [place],  recall of 3/3 words, and performing simple calculations. Displays appropriate judgement and can read correct time from watch face.   No new identified risk were noted.  No failures at ADL's or IADL's.    BMI- discussed the importance of a healthy diet, water intake and the benefits of aerobic exercise. Educational material provided.   24 hour diet recall: Low carb diet  Daily fluid intake: 0 cups of caffeine, 8 cups of water  Dental- every 6 months.  Dr. Ronita Hipps.   Eye- Visual acuity not assessed per patient preference since they have regular follow up with the ophthalmologist.  Wears corrective lenses.  Sleep patterns- Sleeps 7-8 hours at night.  Wakes feeling rested.  Second Hep A/B vaccine vaccine administered, per order.  Given in L deltoid, tolerated well and voiced no distress.  Health maintenance gaps- closed.  Patient Concerns: None at this time. Follow up with PCP as needed.  Exercise Activities and Dietary recommendations Current Exercise Habits: Home exercise routine, Type of exercise: walking, Time (Minutes): 30, Frequency (Times/Week): 7, Weekly Exercise (Minutes/Week): 210, Intensity: Moderate  Goals    . Increase physical activity     Water aerobics when possible Walk for exercise       Fall Risk Fall Risk  02/14/2017 06/07/2016 02/07/2016 11/29/2014  Falls in the past year? No No No No    Depression Screen PHQ 2/9 Scores 02/14/2017 06/07/2016 02/07/2016 11/29/2014  PHQ - 2 Score 0 0 0 0  PHQ- 9 Score 0 - - -     Cognitive Function MMSE - Mini Mental State Exam 02/14/2017 02/07/2016  Orientation to time 5 5  Orientation to Place 5 5  Registration 3 3  Attention/ Calculation 5 5  Recall 3 3  Language- name 2 objects 2 2  Language- repeat 1 1  Language- follow 3 step command 3 3  Language- read & follow direction 1 1  Write a sentence 1 1  Copy design 1 1  Total score 30 30        Immunization History  Administered Date(s) Administered  .  Hep A / Hep B 01/09/2017, 02/14/2017  . Influenza, High Dose Seasonal PF 11/29/2014, 12/27/2015, 11/20/2016  . Influenza-Unspecified 11/18/2011, 11/10/2012, 11/25/2013  . Pneumococcal Conjugate-13 11/29/2014  . Pneumococcal Polysaccharide-23 02/29/2016    Screening Tests Health Maintenance  Topic Date Due  . COLONOSCOPY  12/18/2018  . MAMMOGRAM  02/08/2019  . INFLUENZA VACCINE  Completed  . DEXA SCAN  Completed  . Hepatitis C Screening  Completed  . PNA vac Low Risk Adult  Completed       Plan:    End of life planning; Advance aging; Advanced directives discussed. Copy of current HCPOA/Living Will requested.    I have personally reviewed and noted the following in the patient's chart:   . Medical and social history . Use of alcohol, tobacco or illicit drugs  . Current medications and supplements . Functional ability and status . Nutritional status . Physical activity . Advanced directives . List of other physicians . Hospitalizations, surgeries, and ER visits in previous 12 months . Vitals . Screenings to include cognitive, depression, and falls . Referrals and appointments  In addition, I have reviewed and discussed with patient certain preventive protocols, quality metrics, and best practice recommendations. A written personalized care plan for preventive services as well as general preventive health recommendations were provided to patient.     Varney Biles, LPN  [id number]   Reviewed above information.  Agree with assessment and plan.    Dr Nicki Reaper

## 2017-02-17 ENCOUNTER — Other Ambulatory Visit: Payer: Self-pay | Admitting: Internal Medicine

## 2017-03-29 DIAGNOSIS — H2512 Age-related nuclear cataract, left eye: Secondary | ICD-10-CM | POA: Diagnosis not present

## 2017-04-01 ENCOUNTER — Encounter: Payer: Self-pay | Admitting: *Deleted

## 2017-04-09 ENCOUNTER — Ambulatory Visit: Payer: Medicare Other | Admitting: Certified Registered Nurse Anesthetist

## 2017-04-09 ENCOUNTER — Ambulatory Visit
Admission: RE | Admit: 2017-04-09 | Discharge: 2017-04-09 | Disposition: A | Discharge status: Home / Self Care | Payer: Medicare Other | Source: Ambulatory Visit | Attending: Ophthalmology | Admitting: Ophthalmology

## 2017-04-09 ENCOUNTER — Encounter: Payer: Self-pay | Admitting: Emergency Medicine

## 2017-04-09 ENCOUNTER — Encounter
Admission: RE | Disposition: A | Discharge status: Home / Self Care | Payer: Self-pay | Source: Ambulatory Visit | Attending: Ophthalmology

## 2017-04-09 DIAGNOSIS — Z7982 Long term (current) use of aspirin: Secondary | ICD-10-CM | POA: Insufficient documentation

## 2017-04-09 DIAGNOSIS — G629 Polyneuropathy, unspecified: Secondary | ICD-10-CM | POA: Diagnosis not present

## 2017-04-09 DIAGNOSIS — Z87891 Personal history of nicotine dependence: Secondary | ICD-10-CM | POA: Diagnosis not present

## 2017-04-09 DIAGNOSIS — Z79899 Other long term (current) drug therapy: Secondary | ICD-10-CM | POA: Diagnosis not present

## 2017-04-09 DIAGNOSIS — H2512 Age-related nuclear cataract, left eye: Secondary | ICD-10-CM | POA: Diagnosis not present

## 2017-04-09 DIAGNOSIS — I10 Essential (primary) hypertension: Secondary | ICD-10-CM | POA: Diagnosis not present

## 2017-04-09 DIAGNOSIS — E7800 Pure hypercholesterolemia, unspecified: Secondary | ICD-10-CM | POA: Insufficient documentation

## 2017-04-09 DIAGNOSIS — K219 Gastro-esophageal reflux disease without esophagitis: Secondary | ICD-10-CM | POA: Insufficient documentation

## 2017-04-09 HISTORY — DX: Dizziness and giddiness: R42

## 2017-04-09 HISTORY — DX: Polyneuropathy, unspecified: G62.9

## 2017-04-09 HISTORY — PX: CATARACT EXTRACTION W/PHACO: SHX586

## 2017-04-09 SURGERY — PHACOEMULSIFICATION, CATARACT, WITH IOL INSERTION
Anesthesia: Monitor Anesthesia Care | Site: Eye | Laterality: Left | Wound class: Clean

## 2017-04-09 MED ORDER — FENTANYL CITRATE (PF) 100 MCG/2ML IJ SOLN
INTRAMUSCULAR | Status: AC
  Filled 2017-04-09: qty 2

## 2017-04-09 MED ORDER — LIDOCAINE HCL (PF) 4 % IJ SOLN
INTRAMUSCULAR | Status: AC
  Filled 2017-04-09: qty 5

## 2017-04-09 MED ORDER — POVIDONE-IODINE 5 % OP SOLN
OPHTHALMIC | Status: AC
  Filled 2017-04-09: qty 30

## 2017-04-09 MED ORDER — NA CHONDROIT SULF-NA HYALURON 40-17 MG/ML IO SOLN
INTRAOCULAR | Status: DC | PRN
  Administered 2017-04-09: 1 mL via INTRAOCULAR

## 2017-04-09 MED ORDER — EPINEPHRINE PF 1 MG/ML IJ SOLN
INTRAMUSCULAR | Status: AC
  Filled 2017-04-09: qty 2

## 2017-04-09 MED ORDER — FENTANYL CITRATE (PF) 100 MCG/2ML IJ SOLN
INTRAMUSCULAR | Status: DC | PRN
  Administered 2017-04-09: 25 ug via INTRAVENOUS
  Administered 2017-04-09: 75 ug via INTRAVENOUS

## 2017-04-09 MED ORDER — MOXIFLOXACIN HCL 0.5 % OP SOLN
OPHTHALMIC | Status: DC | PRN
  Administered 2017-04-09: 0.2 mL via OPHTHALMIC

## 2017-04-09 MED ORDER — MOXIFLOXACIN HCL 0.5 % OP SOLN: 1.0000 [drp] | OPHTHALMIC | Status: DC | PRN

## 2017-04-09 MED ORDER — CARBACHOL 0.01 % IO SOLN
INTRAOCULAR | Status: DC | PRN
  Administered 2017-04-09: 0.5 mL via INTRAOCULAR

## 2017-04-09 MED ORDER — NA CHONDROIT SULF-NA HYALURON 40-17 MG/ML IO SOLN
INTRAOCULAR | Status: AC
  Filled 2017-04-09: qty 1

## 2017-04-09 MED ORDER — LIDOCAINE HCL (PF) 4 % IJ SOLN
INTRAOCULAR | Status: DC | PRN
  Administered 2017-04-09: 4 mL via OPHTHALMIC

## 2017-04-09 MED ORDER — BSS IO SOLN
INTRAOCULAR | Status: DC | PRN
  Administered 2017-04-09: 08:00:00 via OPHTHALMIC

## 2017-04-09 MED ORDER — ARMC OPHTHALMIC DILATING DROPS
1.0000 "application " | OPHTHALMIC | Status: AC
  Administered 2017-04-09 (×3): 1 via OPHTHALMIC

## 2017-04-09 MED ORDER — POVIDONE-IODINE 5 % OP SOLN
OPHTHALMIC | Status: DC | PRN
  Administered 2017-04-09: 1 via OPHTHALMIC

## 2017-04-09 MED ORDER — SODIUM CHLORIDE 0.9 % IV SOLN
INTRAVENOUS | Status: DC
  Administered 2017-04-09: 08:00:00 via INTRAVENOUS

## 2017-04-09 SURGICAL SUPPLY — 16 items

## 2017-04-09 NOTE — Anesthesia Procedure Notes (Signed)
Procedure Name: MAC Performed by: Demetrius Charity, CRNA Pre-anesthesia Checklist: Patient identified, Emergency Drugs available, Suction available, Patient being monitored and Timeout performed Oxygen Delivery Method: Nasal cannula

## 2017-04-09 NOTE — Op Note (Signed)
PREOPERATIVE DIAGNOSIS:  Nuclear sclerotic cataract of the left eye.   POSTOPERATIVE DIAGNOSIS:  Nuclear sclerotic cataract of the left eye.   OPERATIVE PROCEDURE: Procedure(s): CATARACT EXTRACTION PHACO AND INTRAOCULAR LENS PLACEMENT (IOC)   SURGEON:  Birder Robson, MD.   ANESTHESIA:  Anesthesiologist: Emmie Niemann, MD CRNA: Demetrius Charity, CRNA  1.      Managed anesthesia care. 2.     0.77ml of Shugarcaine was instilled following the paracentesis   COMPLICATIONS:  None.   TECHNIQUE:   Stop and chop   DESCRIPTION OF PROCEDURE:  The patient was examined and consented in the preoperative holding area where the aforementioned topical anesthesia was applied to the left eye and then brought back to the Operating Room where the left eye was prepped and draped in the usual sterile ophthalmic fashion and a lid speculum was placed. A paracentesis was created with the side port blade and the anterior chamber was filled with viscoelastic. A near clear corneal incision was performed with the steel keratome. A continuous curvilinear capsulorrhexis was performed with a cystotome followed by the capsulorrhexis forceps. Hydrodissection and hydrodelineation were carried out with BSS on a blunt cannula. The lens was removed in a stop and chop  technique and the remaining cortical material was removed with the irrigation-aspiration handpiece. The capsular bag was inflated with viscoelastic and the Technis [id number] lens was placed in the capsular bag without complication. The remaining viscoelastic was removed from the eye with the irrigation-aspiration handpiece. The wounds were hydrated. The anterior chamber was flushed with Miostat and the eye was inflated to physiologic pressure. 0.14ml Vigamox was placed in the anterior chamber. The wounds were found to be water tight. The eye was dressed with Vigamox. The patient was given protective glasses to wear throughout the day and a shield with which to sleep  tonight. The patient was also given drops with which to begin a drop regimen today and will follow-up with me in one day. Implant Name Type Inv. Item Serial No. Manufacturer Lot No. LRB No. Used  LENS IOL DIOP 21.5 - [id number] Intraocular Lens LENS IOL DIOP 21.5 [id number] AMO  Left 1    Procedure(s) with comments: CATARACT EXTRACTION PHACO AND INTRAOCULAR LENS PLACEMENT (IOC) (Left) - [place] 00:29.6 AP% 18.4 CDE 5.44 FLUID PACK LOT # [id number]  Electronically signed: Birder Robson 04/09/2017 8:18 AM

## 2017-04-09 NOTE — Anesthesia Post-op Follow-up Note (Signed)
Anesthesia QCDR form completed.        

## 2017-04-09 NOTE — Anesthesia Postprocedure Evaluation (Signed)
Anesthesia Post Note  Patient: Jillian [name]  Procedure(s) Performed: CATARACT EXTRACTION PHACO AND INTRAOCULAR LENS PLACEMENT (IOC) (Left Eye)  Patient location during evaluation: PACU Anesthesia Type: MAC Level of consciousness: awake and alert and oriented Pain management: pain level controlled Vital Signs Assessment: post-procedure vital signs reviewed and stable Respiratory status: spontaneous breathing, nonlabored ventilation and respiratory function stable Cardiovascular status: blood pressure returned to baseline and stable Postop Assessment: no signs of nausea or vomiting Anesthetic complications: no     Last Vitals:  Vitals:   04/09/17 0819 04/09/17 0831  BP: 140/70 126/61  Pulse: 66 63  Resp: 14 14  Temp: 36.5 C   SpO2: 100% 99%    Last Pain:  Vitals:   04/09/17 0819  TempSrc: Temporal                 Taiga Lupinacci

## 2017-04-09 NOTE — H&P (Signed)
All labs reviewed. Abnormal studies sent to patients PCP when indicated.  Previous H&P reviewed, patient examined, there are NO CHANGES.  Jillian Purohit Porfilio2/12/20197:59 AM

## 2017-04-09 NOTE — Transfer of Care (Signed)
2Immediate Anesthesia Transfer of Care Note  Patient: Jillian [name]  Procedure(s) Performed: CATARACT EXTRACTION PHACO AND INTRAOCULAR LENS PLACEMENT (IOC) (Left Eye)  Patient Location: PACU  Anesthesia Type:MAC  Level of Consciousness: awake, alert  and oriented  Airway & Oxygen Therapy: Patient Spontanous Breathing  Post-op Assessment: Report given to RN and Post -op Vital signs reviewed and stable  Post vital signs: Reviewed and stable  Last Vitals:  Vitals:   04/09/17 0657  BP: (!) 158/82  Pulse: 73  Resp: 17  Temp: (!) 36.2 C  SpO2: 97%    Last Pain:  Vitals:   04/09/17 0657  TempSrc: Oral         Complications: No apparent anesthesia complications

## 2017-04-09 NOTE — Anesthesia Preprocedure Evaluation (Signed)
Anesthesia Evaluation  Patient identified by MRN, date of birth, ID band Patient awake    Reviewed: Allergy & Precautions, NPO status , Patient's Chart, lab work & pertinent test results  History of Anesthesia Complications Negative for: history of anesthetic complications  Airway Mallampati: II  TM Distance: >3 FB Neck ROM: Full    Dental  (+) Partial Lower, Partial Upper   Pulmonary neg sleep apnea, neg COPD, former smoker,    breath sounds clear to auscultation- rhonchi (-) wheezing      Cardiovascular Exercise Tolerance: Good hypertension, Pt. on medications (-) CAD, (-) Past MI, (-) Cardiac Stents and (-) CABG  Rhythm:Regular Rate:Normal - Systolic murmurs and - Diastolic murmurs    Neuro/Psych negative neurological ROS  negative psych ROS   GI/Hepatic Neg liver ROS, GERD  ,  Endo/Other  negative endocrine ROSneg diabetes  Renal/GU negative Renal ROS     Musculoskeletal  (+) Arthritis ,   Abdominal (+) + obese,   Peds  Hematology negative hematology ROS (+)   Anesthesia Other Findings Past Medical History: No date: Allergy     Comment:  seasonal rhinitis No date: Arthritis     Comment:  right shoulder,  no intervention No date: Cancer (HCC)     Comment:  basal cell  No date: GERD (gastroesophageal reflux disease) No date: Hyperlipidemia No date: Hypertension Oct 2011: Irritable bowel syndrome (IBS)     Comment:  last colonoscopy showed diverticulum, no polyps No date: Neuropathy     Comment:  FEET No date: Vertigo     Comment:  OCCAS   Reproductive/Obstetrics                             Anesthesia Physical  Anesthesia Plan  ASA: II  Anesthesia Plan: MAC   Post-op Pain Management:    Induction: Intravenous  PONV Risk Score and Plan: 2 and Midazolam  Airway Management Planned: Natural Airway  Additional Equipment:   Intra-op Plan:   Post-operative Plan:    Informed Consent: I have reviewed the patients History and Physical, chart, labs and discussed the procedure including the risks, benefits and alternatives for the proposed anesthesia with the patient or authorized representative who has indicated his/her understanding and acceptance.     Plan Discussed with: CRNA and Anesthesiologist  Anesthesia Plan Comments:         Anesthesia Quick Evaluation  

## 2017-04-09 NOTE — Discharge Instructions (Signed)
Eye Surgery Discharge Instructions  Expect mild scratchy sensation or mild soreness. DO NOT RUB YOUR EYE!  The day of surgery:  Minimal physical activity, but bed rest is not required  No reading, computer work, or close hand work  No bending, lifting, or straining.  May watch TV  For 24 hours:  No driving, legal decisions, or alcoholic beverages  Safety precautions  Eat anything you prefer: It is better to start with liquids, then soup then solid foods.  _____ Eye patch should be worn until postoperative exam tomorrow.  ____ Solar shield eyeglasses should be worn for comfort in the sunlight/patch while sleeping  Resume all regular medications including aspirin or Coumadin if these were discontinued prior to surgery. You may shower, bathe, shave, or wash your hair. Tylenol may be taken for mild discomfort.  Call your doctor if you experience significant pain, nausea, or vomiting, fever > 101 or other signs of infection. [phone] or [phone] Specific instructions:  Follow-up Information    Birder Robson, MD Follow up.   Specialty:  Ophthalmology Why:  February 13 at 10:00 Contact information: [address] [place] [address] [phone]

## 2017-04-10 ENCOUNTER — Encounter: Payer: Self-pay | Admitting: Ophthalmology

## 2017-04-20 ENCOUNTER — Other Ambulatory Visit: Payer: Self-pay | Admitting: Internal Medicine

## 2017-04-23 DIAGNOSIS — H2511 Age-related nuclear cataract, right eye: Secondary | ICD-10-CM | POA: Diagnosis not present

## 2017-04-25 ENCOUNTER — Encounter: Payer: Self-pay | Admitting: *Deleted

## 2017-04-30 ENCOUNTER — Encounter
Admission: RE | Disposition: A | Discharge status: Home / Self Care | Payer: Self-pay | Source: Ambulatory Visit | Attending: Ophthalmology

## 2017-04-30 ENCOUNTER — Ambulatory Visit: Payer: Medicare Other | Admitting: Certified Registered Nurse Anesthetist

## 2017-04-30 ENCOUNTER — Ambulatory Visit
Admission: RE | Admit: 2017-04-30 | Discharge: 2017-04-30 | Disposition: A | Discharge status: Home / Self Care | Payer: Medicare Other | Source: Ambulatory Visit | Attending: Ophthalmology | Admitting: Ophthalmology

## 2017-04-30 ENCOUNTER — Encounter: Payer: Self-pay | Admitting: *Deleted

## 2017-04-30 DIAGNOSIS — Z6831 Body mass index (BMI) 31.0-31.9, adult: Secondary | ICD-10-CM | POA: Insufficient documentation

## 2017-04-30 DIAGNOSIS — I10 Essential (primary) hypertension: Secondary | ICD-10-CM | POA: Diagnosis not present

## 2017-04-30 DIAGNOSIS — G629 Polyneuropathy, unspecified: Secondary | ICD-10-CM | POA: Insufficient documentation

## 2017-04-30 DIAGNOSIS — Z79899 Other long term (current) drug therapy: Secondary | ICD-10-CM | POA: Insufficient documentation

## 2017-04-30 DIAGNOSIS — K589 Irritable bowel syndrome without diarrhea: Secondary | ICD-10-CM | POA: Diagnosis not present

## 2017-04-30 DIAGNOSIS — Z7982 Long term (current) use of aspirin: Secondary | ICD-10-CM | POA: Diagnosis not present

## 2017-04-30 DIAGNOSIS — M1990 Unspecified osteoarthritis, unspecified site: Secondary | ICD-10-CM | POA: Insufficient documentation

## 2017-04-30 DIAGNOSIS — E7800 Pure hypercholesterolemia, unspecified: Secondary | ICD-10-CM | POA: Insufficient documentation

## 2017-04-30 DIAGNOSIS — Z881 Allergy status to other antibiotic agents status: Secondary | ICD-10-CM | POA: Diagnosis not present

## 2017-04-30 DIAGNOSIS — K219 Gastro-esophageal reflux disease without esophagitis: Secondary | ICD-10-CM | POA: Diagnosis not present

## 2017-04-30 DIAGNOSIS — Z87891 Personal history of nicotine dependence: Secondary | ICD-10-CM | POA: Insufficient documentation

## 2017-04-30 DIAGNOSIS — Z85828 Personal history of other malignant neoplasm of skin: Secondary | ICD-10-CM | POA: Diagnosis not present

## 2017-04-30 DIAGNOSIS — Z887 Allergy status to serum and vaccine status: Secondary | ICD-10-CM | POA: Insufficient documentation

## 2017-04-30 DIAGNOSIS — E669 Obesity, unspecified: Secondary | ICD-10-CM | POA: Insufficient documentation

## 2017-04-30 DIAGNOSIS — H2511 Age-related nuclear cataract, right eye: Secondary | ICD-10-CM | POA: Insufficient documentation

## 2017-04-30 DIAGNOSIS — Z882 Allergy status to sulfonamides status: Secondary | ICD-10-CM | POA: Diagnosis not present

## 2017-04-30 DIAGNOSIS — E785 Hyperlipidemia, unspecified: Secondary | ICD-10-CM | POA: Diagnosis not present

## 2017-04-30 HISTORY — PX: CATARACT EXTRACTION W/PHACO: SHX586

## 2017-04-30 SURGERY — PHACOEMULSIFICATION, CATARACT, WITH IOL INSERTION
Anesthesia: Monitor Anesthesia Care | Site: Eye | Laterality: Right | Wound class: Clean

## 2017-04-30 MED ORDER — SODIUM CHLORIDE 0.9 % IV SOLN
INTRAVENOUS | Status: DC
  Administered 2017-04-30: 06:00:00 via INTRAVENOUS

## 2017-04-30 MED ORDER — POVIDONE-IODINE 5 % OP SOLN
OPHTHALMIC | Status: DC | PRN
  Administered 2017-04-30: 1 via OPHTHALMIC

## 2017-04-30 MED ORDER — ARMC OPHTHALMIC DILATING DROPS
1.0000 "application " | OPHTHALMIC | Status: AC
  Administered 2017-04-30 (×3): 1 via OPHTHALMIC

## 2017-04-30 MED ORDER — NA CHONDROIT SULF-NA HYALURON 40-17 MG/ML IO SOLN
INTRAOCULAR | Status: DC | PRN
  Administered 2017-04-30: 1 mL via INTRAOCULAR

## 2017-04-30 MED ORDER — MOXIFLOXACIN HCL 0.5 % OP SOLN: 1.0000 [drp] | OPHTHALMIC | Status: DC | PRN

## 2017-04-30 MED ORDER — NA CHONDROIT SULF-NA HYALURON 40-17 MG/ML IO SOLN
INTRAOCULAR | Status: AC
  Filled 2017-04-30: qty 1

## 2017-04-30 MED ORDER — EPINEPHRINE PF 1 MG/ML IJ SOLN
INTRAMUSCULAR | Status: AC
  Filled 2017-04-30: qty 2

## 2017-04-30 MED ORDER — ARMC OPHTHALMIC DILATING DROPS
OPHTHALMIC | Status: AC
  Administered 2017-04-30: 1 via OPHTHALMIC
  Filled 2017-04-30: qty 0.4

## 2017-04-30 MED ORDER — MOXIFLOXACIN HCL 0.5 % OP SOLN
OPHTHALMIC | Status: AC
  Filled 2017-04-30: qty 3

## 2017-04-30 MED ORDER — MOXIFLOXACIN HCL 0.5 % OP SOLN
OPHTHALMIC | Status: DC | PRN
  Administered 2017-04-30: 0.2 mL via OPHTHALMIC

## 2017-04-30 MED ORDER — MIDAZOLAM HCL 2 MG/2ML IJ SOLN
INTRAMUSCULAR | Status: AC
  Filled 2017-04-30: qty 2

## 2017-04-30 MED ORDER — CARBACHOL 0.01 % IO SOLN
INTRAOCULAR | Status: DC | PRN
  Administered 2017-04-30: 0.5 mL via INTRAOCULAR

## 2017-04-30 MED ORDER — EPINEPHRINE PF 1 MG/ML IJ SOLN
INTRAOCULAR | Status: DC | PRN
  Administered 2017-04-30: 08:00:00 via OPHTHALMIC

## 2017-04-30 MED ORDER — MIDAZOLAM HCL 2 MG/2ML IJ SOLN
INTRAMUSCULAR | Status: DC | PRN
  Administered 2017-04-30: 0.5 mg via INTRAVENOUS
  Administered 2017-04-30: 1 mg via INTRAVENOUS
  Administered 2017-04-30: 0.5 mg via INTRAVENOUS

## 2017-04-30 MED ORDER — LIDOCAINE HCL (PF) 4 % IJ SOLN
INTRAOCULAR | Status: DC | PRN
  Administered 2017-04-30: 4 mL via OPHTHALMIC

## 2017-04-30 SURGICAL SUPPLY — 16 items

## 2017-04-30 NOTE — Transfer of Care (Signed)
Immediate Anesthesia Transfer of Care Note  Patient: Jillian [name]  Procedure(s) Performed: CATARACT EXTRACTION PHACO AND INTRAOCULAR LENS PLACEMENT (IOC) (Right Eye)  Patient Location: PACU and Short Stay  Anesthesia Type:MAC  Level of Consciousness: awake, alert , oriented and patient cooperative  Airway & Oxygen Therapy: Patient Spontanous Breathing  Post-op Assessment: Report given to RN and Post -op Vital signs reviewed and stable  Post vital signs: Reviewed and stable  Last Vitals:  Vitals:   04/30/17 0610  BP: (!) 157/80  Pulse: 67  Resp: 16  Temp: 36.4 C  SpO2: 100%    Last Pain:  Vitals:   04/30/17 0610  TempSrc: Oral         Complications: No apparent anesthesia complications

## 2017-04-30 NOTE — Anesthesia Preprocedure Evaluation (Signed)
Anesthesia Evaluation  Patient identified by MRN, date of birth, ID band Patient awake    Reviewed: Allergy & Precautions, NPO status , Patient's Chart, lab work & pertinent test results  History of Anesthesia Complications Negative for: history of anesthetic complications  Airway Mallampati: II  TM Distance: >3 FB Neck ROM: Full    Dental  (+) Partial Lower, Partial Upper   Pulmonary neg sleep apnea, neg COPD, former smoker,    breath sounds clear to auscultation- rhonchi (-) wheezing      Cardiovascular Exercise Tolerance: Good hypertension, Pt. on medications (-) CAD, (-) Past MI, (-) Cardiac Stents and (-) CABG  Rhythm:Regular Rate:Normal - Systolic murmurs and - Diastolic murmurs    Neuro/Psych negative neurological ROS  negative psych ROS   GI/Hepatic Neg liver ROS, GERD  ,  Endo/Other  negative endocrine ROSneg diabetes  Renal/GU negative Renal ROS     Musculoskeletal  (+) Arthritis ,   Abdominal (+) + obese,   Peds  Hematology negative hematology ROS (+)   Anesthesia Other Findings Past Medical History: No date: Allergy     Comment:  seasonal rhinitis No date: Arthritis     Comment:  right shoulder,  no intervention No date: Cancer [facility])     Comment:  basal cell  No date: GERD (gastroesophageal reflux disease) No date: Hyperlipidemia No date: Hypertension Oct 2011: Irritable bowel syndrome (IBS)     Comment:  last colonoscopy showed diverticulum, no polyps No date: Neuropathy     Comment:  FEET No date: Vertigo     Comment:  OCCAS   Reproductive/Obstetrics                             Anesthesia Physical  Anesthesia Plan  ASA: II  Anesthesia Plan: MAC   Post-op Pain Management:    Induction: Intravenous  PONV Risk Score and Plan: 2 and Midazolam  Airway Management Planned: Natural Airway  Additional Equipment:   Intra-op Plan:   Post-operative Plan:    Informed Consent: I have reviewed the patients History and Physical, chart, labs and discussed the procedure including the risks, benefits and alternatives for the proposed anesthesia with the patient or authorized representative who has indicated his/her understanding and acceptance.     Plan Discussed with: CRNA and Anesthesiologist  Anesthesia Plan Comments:         Anesthesia Quick Evaluation

## 2017-04-30 NOTE — Anesthesia Postprocedure Evaluation (Signed)
Anesthesia Post Note  Patient: Skylin Kennerson Arnesen  Procedure(s) Performed: CATARACT EXTRACTION PHACO AND INTRAOCULAR LENS PLACEMENT (IOC) (Right Eye)  Patient location during evaluation: PACU Anesthesia Type: MAC Level of consciousness: awake and alert Pain management: pain level controlled Vital Signs Assessment: post-procedure vital signs reviewed and stable Respiratory status: spontaneous breathing, nonlabored ventilation, respiratory function stable and patient connected to nasal cannula oxygen Cardiovascular status: stable and blood pressure returned to baseline Postop Assessment: no apparent nausea or vomiting Anesthetic complications: no     Last Vitals:  Vitals:   04/30/17 0610 04/30/17 0751  BP: (!) 157/80 133/66  Pulse: 67 68  Resp: 16 16  Temp: 36.4 C 36.8 C  SpO2: 100% 99%    Last Pain:  Vitals:   04/30/17 0751  TempSrc: Oral                 Martha Clan

## 2017-04-30 NOTE — Anesthesia Post-op Follow-up Note (Signed)
Anesthesia QCDR form completed.        

## 2017-04-30 NOTE — Discharge Instructions (Signed)
FOLLOW DR. PORFILIO'S POSTOP EYE DROP SHEET AS REVIEWED.  Eye Surgery Discharge Instructions  Expect mild scratchy sensation or mild soreness. DO NOT RUB YOUR EYE!  The day of surgery:  Minimal physical activity, but bed rest is not required  No reading, computer work, or close hand work  No bending, lifting, or straining.  May watch TV  For 24 hours:  No driving, legal decisions, or alcoholic beverages  Safety precautions  Eat anything you prefer: It is better to start with liquids, then soup then solid foods.  _____ Eye patch should be worn until postoperative exam tomorrow.  ____ Solar shield eyeglasses should be worn for comfort in the sunlight/patch while sleeping  Resume all regular medications including aspirin or Coumadin if these were discontinued prior to surgery. You may shower, bathe, shave, or wash your hair. Tylenol may be taken for mild discomfort.  Call your doctor if you experience significant pain, nausea, or vomiting, fever > 101 or other signs of infection. [phone] or [phone] Specific instructions:  Follow-up Information    Birder Robson, MD Follow up.   Specialty:  Ophthalmology Why:  05/01/17 @ 2:10 pm Contact information: [address] [place] [address] [phone]

## 2017-04-30 NOTE — Op Note (Signed)
PREOPERATIVE DIAGNOSIS:  Nuclear sclerotic cataract of the right eye.   POSTOPERATIVE DIAGNOSIS:  Nuclear Sclerotic Cataract Right Eye   OPERATIVE PROCEDURE: Procedure(s): CATARACT EXTRACTION PHACO AND INTRAOCULAR LENS PLACEMENT (IOC)   SURGEON:  Birder Robson, MD.   ANESTHESIA:  Anesthesiologist: Martha Clan, MD CRNA: Eben Burow, CRNA  1.      Managed anesthesia care. 2.      0.21ml of Shugarcaine was instilled in the eye following the paracentesis.   COMPLICATIONS:  None.   TECHNIQUE:   Stop and chop   DESCRIPTION OF PROCEDURE:  The patient was examined and consented in the preoperative holding area where the aforementioned topical anesthesia was applied to the right eye and then brought back to the Operating Room where the right eye was prepped and draped in the usual sterile ophthalmic fashion and a lid speculum was placed. A paracentesis was created with the side port blade and the anterior chamber was filled with viscoelastic. A near clear corneal incision was performed with the steel keratome. A continuous curvilinear capsulorrhexis was performed with a cystotome followed by the capsulorrhexis forceps. Hydrodissection and hydrodelineation were carried out with BSS on a blunt cannula. The lens was removed in a stop and chop  technique and the remaining cortical material was removed with the irrigation-aspiration handpiece. The capsular bag was inflated with viscoelastic and the Technis [id number]  lens was placed in the capsular bag without complication. The remaining viscoelastic was removed from the eye with the irrigation-aspiration handpiece. The wounds were hydrated. The anterior chamber was flushed with Miostat and the eye was inflated to physiologic pressure. 0.25ml of Vigamox was placed in the anterior chamber. The wounds were found to be water tight. The eye was dressed with Vigamox. The patient was given protective glasses to wear throughout the day and a shield with which to  sleep tonight. The patient was also given drops with which to begin a drop regimen today and will follow-up with me in one day. Implant Name Type Inv. Item Serial No. Manufacturer Lot No. LRB No. Used  LENS IOL DIOP 21.5 - [id number] Intraocular Lens LENS IOL DIOP 21.5 [id number] AMO  Right 1   Procedure(s) with comments: CATARACT EXTRACTION PHACO AND INTRAOCULAR LENS PLACEMENT (IOC) (Right) - [place] 00:43.1 AP% 13.7 CDE 5.91 Fluid Pack Lot # [id number]  Electronically signed: Birder Robson 04/30/2017 7:50 AM

## 2017-04-30 NOTE — H&P (Signed)
All labs reviewed. Abnormal studies sent to patients PCP when indicated.  Previous H&P reviewed, patient examined, there are NO CHANGES.  Jillian Mccormick Porfilio3/5/20197:13 AM

## 2017-05-21 ENCOUNTER — Other Ambulatory Visit: Payer: Self-pay | Admitting: Internal Medicine

## 2017-06-29 ENCOUNTER — Other Ambulatory Visit: Payer: Self-pay | Admitting: Internal Medicine

## 2017-07-09 ENCOUNTER — Encounter: Payer: Self-pay | Admitting: Internal Medicine

## 2017-07-09 ENCOUNTER — Ambulatory Visit (INDEPENDENT_AMBULATORY_CARE_PROVIDER_SITE_OTHER): Payer: Medicare Other | Admitting: Internal Medicine

## 2017-07-09 VITALS — BP 110/68 | HR 64 | Temp 98.2°F | Resp 14 | Ht 66.0 in | Wt 204.2 lb

## 2017-07-09 DIAGNOSIS — Z23 Encounter for immunization: Secondary | ICD-10-CM

## 2017-07-09 DIAGNOSIS — Z0000 Encounter for general adult medical examination without abnormal findings: Secondary | ICD-10-CM

## 2017-07-09 DIAGNOSIS — G629 Polyneuropathy, unspecified: Secondary | ICD-10-CM

## 2017-07-09 DIAGNOSIS — K760 Fatty (change of) liver, not elsewhere classified: Secondary | ICD-10-CM | POA: Diagnosis not present

## 2017-07-09 DIAGNOSIS — I10 Essential (primary) hypertension: Secondary | ICD-10-CM | POA: Diagnosis not present

## 2017-07-09 DIAGNOSIS — E782 Mixed hyperlipidemia: Secondary | ICD-10-CM | POA: Diagnosis not present

## 2017-07-09 LAB — COMPREHENSIVE METABOLIC PANEL
ALBUMIN: 4.4 g/dL (ref 3.5–5.2)
ALT: 17 U/L (ref 0–35)
AST: 23 U/L (ref 0–37)
Alkaline Phosphatase: 55 U/L (ref 39–117)
BUN: 28 mg/dL — ABNORMAL HIGH (ref 6–23)
CALCIUM: 10.1 mg/dL (ref 8.4–10.5)
CO2: 26 mEq/L (ref 19–32)
Chloride: 106 mEq/L (ref 96–112)
Creatinine, Ser: 1.19 mg/dL (ref 0.40–1.20)
GFR: 47.95 mL/min — AB (ref >60.00)
Glucose, Bld: 88 mg/dL (ref 70–99)
Potassium: 4.7 mEq/L (ref 3.5–5.1)
Sodium: 145 mEq/L (ref 135–145)
Total Bilirubin: 0.4 mg/dL (ref 0.2–1.2)
Total Protein: 7.5 g/dL (ref 6.0–8.3)

## 2017-07-09 LAB — VITAMIN B12: Vitamin B-12: 248 pg/mL (ref 211–911)

## 2017-07-09 LAB — LIPID PANEL
CHOLESTEROL: 152 mg/dL (ref 0–200)
HDL: 56.5 mg/dL (ref >39.00)
LDL CALC: 64 mg/dL (ref 0–99)
NonHDL: 95.12
TRIGLYCERIDES: 154 mg/dL — AB (ref 0.0–149.0)
Total CHOL/HDL Ratio: 3
VLDL: 30.8 mg/dL (ref 0.0–40.0)

## 2017-07-09 LAB — TSH: TSH: 1.23 u[IU]/mL (ref 0.35–4.50)

## 2017-07-09 LAB — HEMOGLOBIN A1C: Hgb A1c MFr Bld: 5.6 % (ref 4.6–6.5)

## 2017-07-09 MED ORDER — ZOSTER VAC RECOMB ADJUVANTED 50 MCG/0.5ML IM SUSR: 0.5000 mL | Freq: Once | INTRAMUSCULAR | 1 refills | Status: AC

## 2017-07-09 NOTE — Patient Instructions (Addendum)
Your colonoscopy is due next year.   the cologuard test is an excellent alternative screening test for colon cancer and is covered by Medicare  .  The ShingRx vaccine is now available in local pharmacies and is much more protective thant Zostavaxs,  It is therefore ADVISED for all interested adults over 69 to prevent shingles    Health Maintenance for Postmenopausal Women Menopause is a normal process in which your reproductive ability comes to an end. This process happens gradually over a span of months to years, usually between the ages of 38 and 39. Menopause is complete when you have missed 12 consecutive menstrual periods. It is important to talk with your health care provider about some of the most common conditions that affect postmenopausal women, such as heart disease, cancer, and bone loss (osteoporosis). Adopting a healthy lifestyle and getting preventive care can help to promote your health and wellness. Those actions can also lower your chances of developing some of these common conditions. What should I know about menopause? During menopause, you may experience a number of symptoms, such as:  Moderate-to-severe hot flashes.  Night sweats.  Decrease in sex drive.  Mood swings.  Headaches.  Tiredness.  Irritability.  Memory problems.  Insomnia.  Choosing to treat or not to treat menopausal changes is an individual decision that you make with your health care provider. What should I know about hormone replacement therapy and supplements? Hormone therapy products are effective for treating symptoms that are associated with menopause, such as hot flashes and night sweats. Hormone replacement carries certain risks, especially as you become older. If you are thinking about using estrogen or estrogen with progestin treatments, discuss the benefits and risks with your health care provider. What should I know about heart disease and stroke? Heart disease, heart attack, and  stroke become more likely as you age. This may be due, in part, to the hormonal changes that your body experiences during menopause. These can affect how your body processes dietary fats, triglycerides, and cholesterol. Heart attack and stroke are both medical emergencies. There are many things that you can do to help prevent heart disease and stroke:  Have your blood pressure checked at least every 1-2 years. High blood pressure causes heart disease and increases the risk of stroke.  If you are 46-49 years old, ask your health care provider if you should take aspirin to prevent a heart attack or a stroke.  Do not use any tobacco products, including cigarettes, chewing tobacco, or electronic cigarettes. If you need help quitting, ask your health care provider.  It is important to eat a healthy diet and maintain a healthy weight. ? Be sure to include plenty of vegetables, fruits, low-fat dairy products, and lean protein. ? Avoid eating foods that are high in solid fats, added sugars, or salt (sodium).  Get regular exercise. This is one of the most important things that you can do for your health. ? Try to exercise for at least 150 minutes each week. The type of exercise that you do should increase your heart rate and make you sweat. This is known as moderate-intensity exercise. ? Try to do strengthening exercises at least twice each week. Do these in addition to the moderate-intensity exercise.  Know your numbers.Ask your health care provider to check your cholesterol and your blood glucose. Continue to have your blood tested as directed by your health care provider.  What should I know about cancer screening? There are several types of cancer.  Take the following steps to reduce your risk and to catch any cancer development as early as possible. Breast Cancer  Practice breast self-awareness. ? This means understanding how your breasts normally appear and feel. ? It also means doing regular  breast self-exams. Let your health care provider know about any changes, no matter how small.  If you are 11 or older, have a clinician do a breast exam (clinical breast exam or CBE) every year. Depending on your age, family history, and medical history, it may be recommended that you also have a yearly breast X-ray (mammogram).  If you have a family history of breast cancer, talk with your health care provider about genetic screening.  If you are at high risk for breast cancer, talk with your health care provider about having an MRI and a mammogram every year.  Breast cancer (BRCA) gene test is recommended for women who have family members with BRCA-related cancers. Results of the assessment will determine the need for genetic counseling and BRCA1 and for BRCA2 testing. BRCA-related cancers include these types: ? Breast. This occurs in males or females. ? Ovarian. ? Tubal. This may also be called fallopian tube cancer. ? Cancer of the abdominal or pelvic lining (peritoneal cancer). ? Prostate. ? Pancreatic.  Cervical, Uterine, and Ovarian Cancer Your health care provider may recommend that you be screened regularly for cancer of the pelvic organs. These include your ovaries, uterus, and vagina. This screening involves a pelvic exam, which includes checking for microscopic changes to the surface of your cervix (Pap test).  For women ages 21-65, health care providers may recommend a pelvic exam and a Pap test every three years. For women ages 49-65, they may recommend the Pap test and pelvic exam, combined with testing for human papilloma virus (HPV), every five years. Some types of HPV increase your risk of cervical cancer. Testing for HPV may also be done on women of any age who have unclear Pap test results.  Other health care providers may not recommend any screening for nonpregnant women who are considered low risk for pelvic cancer and have no symptoms. Ask your health care provider if a  screening pelvic exam is right for you.  If you have had past treatment for cervical cancer or a condition that could lead to cancer, you need Pap tests and screening for cancer for at least 20 years after your treatment. If Pap tests have been discontinued for you, your risk factors (such as having a new sexual partner) need to be reassessed to determine if you should start having screenings again. Some women have medical problems that increase the chance of getting cervical cancer. In these cases, your health care provider may recommend that you have screening and Pap tests more often.  If you have a family history of uterine cancer or ovarian cancer, talk with your health care provider about genetic screening.  If you have vaginal bleeding after reaching menopause, tell your health care provider.  There are currently no reliable tests available to screen for ovarian cancer.  Lung Cancer Lung cancer screening is recommended for adults 48-71 years old who are at high risk for lung cancer because of a history of smoking. A yearly low-dose CT scan of the lungs is recommended if you:  Currently smoke.  Have a history of at least 30 pack-years of smoking and you currently smoke or have quit within the past 15 years. A pack-year is smoking an average of one pack of cigarettes  per day for one year.  Yearly screening should:  Continue until it has been 15 years since you quit.  Stop if you develop a health problem that would prevent you from having lung cancer treatment.  Colorectal Cancer  This type of cancer can be detected and can often be prevented.  Routine colorectal cancer screening usually begins at age 59 and continues through age 72.  If you have risk factors for colon cancer, your health care provider may recommend that you be screened at an earlier age.  If you have a family history of colorectal cancer, talk with your health care provider about genetic screening.  Your health  care provider may also recommend using home test kits to check for hidden blood in your stool.  A small camera at the end of a tube can be used to examine your colon directly (sigmoidoscopy or colonoscopy). This is done to check for the earliest forms of colorectal cancer.  Direct examination of the colon should be repeated every 5-10 years until age 8. However, if early forms of precancerous polyps or small growths are found or if you have a family history or genetic risk for colorectal cancer, you may need to be screened more often.  Skin Cancer  Check your skin from head to toe regularly.  Monitor any moles. Be sure to tell your health care provider: ? About any new moles or changes in moles, especially if there is a change in a mole's shape or color. ? If you have a mole that is larger than the size of a pencil eraser.  If any of your family members has a history of skin cancer, especially at a young age, talk with your health care provider about genetic screening.  Always use sunscreen. Apply sunscreen liberally and repeatedly throughout the day.  Whenever you are outside, protect yourself by wearing long sleeves, pants, a wide-brimmed hat, and sunglasses.  What should I know about osteoporosis? Osteoporosis is a condition in which bone destruction happens more quickly than new bone creation. After menopause, you may be at an increased risk for osteoporosis. To help prevent osteoporosis or the bone fractures that can happen because of osteoporosis, the following is recommended:  If you are 80-57 years old, get at least 1,000 mg of calcium and at least 600 mg of vitamin D per day.  If you are older than age 44 but younger than age 67, get at least 1,200 mg of calcium and at least 600 mg of vitamin D per day.  If you are older than age 44, get at least 1,200 mg of calcium and at least 800 mg of vitamin D per day.  Smoking and excessive alcohol intake increase the risk of  osteoporosis. Eat foods that are rich in calcium and vitamin D, and do weight-bearing exercises several times each week as directed by your health care provider. What should I know about how menopause affects my mental health? Depression may occur at any age, but it is more common as you become older. Common symptoms of depression include:  Low or sad mood.  Changes in sleep patterns.  Changes in appetite or eating patterns.  Feeling an overall lack of motivation or enjoyment of activities that you previously enjoyed.  Frequent crying spells.  Talk with your health care provider if you think that you are experiencing depression. What should I know about immunizations? It is important that you get and maintain your immunizations. These include:  Tetanus, diphtheria, and  pertussis (Tdap) booster vaccine.  Influenza every year before the flu season begins.  Pneumonia vaccine.  Shingles vaccine.  Your health care provider may also recommend other immunizations. This information is not intended to replace advice given to you by your health care provider. Make sure you discuss any questions you have with your health care provider. Document Released: 04/06/2005 Document Revised: 09/02/2015 Document Reviewed: 11/16/2014 Elsevier Interactive Patient Education  2018 [employer].

## 2017-07-09 NOTE — Progress Notes (Signed)
Patient ID: Jillian [name], female    DOB: 1950/12/11  Age: 67 y.o. MRN: [id number]  The patient is here for annual preventive  examination and management of other chronic and acute problems.   The risk factors are reflected in the social history.  The roster of all physicians providing medical care to patient - is listed in the Snapshot section of the chart.  Activities of daily living:  The patient is 100% independent in all ADLs: dressing, toileting, feeding as well as independent mobility  Home safety : The patient has smoke detectors in the home. They wear seatbelts.  There are no firearms at home. There is no violence in the home.   There is no risks for hepatitis, STDs or HIV. There is no   history of blood transfusion. They have no travel history to infectious disease endemic areas of the world.  The patient has seen their dentist in the last six month. They have seen their eye doctor in the last year. They admit to slight hearing difficulty with regard to whispered voices and some television programs.  They have deferred audiologic testing in the last year.  They do not  have excessive sun exposure. Discussed the need for sun protection: hats, long sleeves and use of sunscreen if there is significant sun exposure.   Diet: the importance of a healthy diet is discussed. They do have a healthy diet.  The benefits of regular aerobic exercise were discussed. She walks 4 times per week ,  20 minutes.   Depression screen: there are no signs or vegative symptoms of depression- irritability, change in appetite, anhedonia, sadness/tearfullness.  Cognitive assessment: the patient manages all their financial and personal affairs and is actively engaged. They could relate day,date,year and events; recalled 2/3 objects at 3 minutes; performed clock-face test normally.  The following portions of the patient's history were reviewed and updated as appropriate: allergies, current medications, past  family history, past medical history,  past surgical history, past social history  and problem list.  Visual acuity was not assessed per patient preference since she has regular follow up with her ophthalmologist. Hearing and body mass index were assessed and reviewed.   During the course of the visit the patient was educated and counseled about appropriate screening and preventive services including : fall prevention , diabetes screening, nutrition counseling, colorectal cancer screening, and recommended immunizations.    CC: The primary encounter diagnosis was Neuropathy. Diagnoses of Mixed hyperlipidemia, Need for hepatitis A and B vaccination, Encounter for preventive health examination, Hepatic steatosis, and Essential hypertension were also pertinent to this visit.  Cataract surgery bilaeral, since last visit  Colonoscopy 2010  Normal  Mammogram Dec 2018   Neuropathy   Chronic for 20 years,  several family membvers affected: mother and MGF.   Needs 3rd dose of Hep A and B  History Jillian [name] has a past medical history of Allergy, Arthritis, Cancer ([place]), GERD (gastroesophageal reflux disease), Hyperlipidemia, Hypertension, Irritable bowel syndrome (IBS) (Oct 2011), Neuropathy, and Vertigo.   She has a past surgical history that includes Tubal ligation (1977); Tonsillectomy and adenoidectomy (1956); Vein Surgery; Cataract extraction w/PHACO (Left, 04/09/2017); and Cataract extraction w/PHACO (Right, 04/30/2017).   Her family history includes Cancer in her cousin; Hyperlipidemia in her father, maternal grandfather, maternal grandmother, and mother; Hypertension in her father.She reports that she quit smoking about 18 years ago. Her smoking use included cigarettes. She has never used smokeless tobacco. She reports that she does not drink alcohol  or use drugs.  Outpatient Medications Prior to Visit  Medication Sig Dispense Refill  . amLODipine (NORVASC) 2.5 MG tablet TAKE ONE TABLET BY MOUTH  DAILY 30 tablet 2  . aspirin 81 MG tablet Take 81 mg by mouth daily.      . cetirizine (ZYRTEC) 10 MG tablet Take 10 mg by mouth at bedtime.    . Cholecalciferol (VITAMIN D3) 1000 UNITS CAPS Take 1,000 Units by mouth daily.     . enalapril (VASOTEC) 20 MG tablet TAKE ONE TABLET BY MOUTH TWICE A DAY 60 tablet 1  . famotidine (PEPCID) 10 MG tablet Take 10 mg by mouth daily.    . Fluocinolone Acetonide 0.01 % OIL Apply 1 application topically See admin instructions. Apply to affected ear daily as needed for dermatisis    . fluticasone (FLONASE) 50 MCG/ACT nasal spray PLACE 2 SPRAYS INTO THE NOSE DAILY. 16 g 4  . ibuprofen (ADVIL,MOTRIN) 200 MG tablet Take 400 mg by mouth every 6 (six) hours as needed for headache or moderate pain.     Marland Kitchen lactulose (CHRONULAC) 10 GM/15ML solution TAKE 30 MILLILITERS EVERY 4 HOURS UNTIL CONSTIPATION IS RELIEVED 473 mL 2  . NON FORMULARY Apply 1 application topically daily as needed (for dermatisis on face). Hydrocortisone 2.5%/Ketoconazole 2% Cream    . simvastatin (ZOCOR) 40 MG tablet TAKE 1 TABLET BY MOUTH EVERY NIGHT AT BEDTIME 90 tablet 1  . omeprazole (PRILOSEC) 20 MG capsule Take 1 capsule (20 mg total) by mouth daily as needed. (Patient not taking: Reported on 07/09/2017) 90 capsule 3   No facility-administered medications prior to visit.     Review of Systems  Patient denies headache, fevers, malaise, unintentional weight loss, skin rash, eye pain, sinus congestion and sinus pain, sore throat, dysphagia,  hemoptysis , cough, dyspnea, wheezing, chest pain, palpitations, orthopnea, edema, abdominal pain, nausea, melena, diarrhea, constipation, flank pain, dysuria, hematuria, urinary  Frequency, nocturia, numbness, tingling, seizures,  Focal weakness, Loss of consciousness,  Tremor, insomnia, depression, anxiety, and suicidal ideation.      Objective:  BP 110/68 (BP Location: Left Arm, Patient Position: Sitting, Cuff Size: Large)   Pulse 64   Temp 98.2 F  (36.8 C) (Oral)   Resp 14   Ht 5\' 6"  (1.676 m)   Wt 204 lb 3.2 oz (92.6 kg)   SpO2 95%   BMI 32.96 kg/m   Physical Exam   General appearance: alert, cooperative and appears stated age Head: Normocephalic, without obvious abnormality, atraumatic Eyes: conjunctivae/corneas clear. PERRL, EOM's intact. Fundi benign. Ears: normal TM's and external ear canals both ears Nose: Nares normal. Septum midline. Mucosa normal. No drainage or sinus tenderness. Throat: lips, mucosa, and tongue normal; teeth and gums normal Neck: no adenopathy, no carotid bruit, no JVD, supple, symmetrical, trachea midline and thyroid not enlarged, symmetric, no tenderness/mass/nodules Lungs: clear to auscultation bilaterally Breasts: normal appearance, no masses or tenderness Heart: regular rate and rhythm, S1, S2 normal, no murmur, click, rub or gallop Abdomen: soft, non-tender; bowel sounds normal; no masses,  no organomegaly Extremities: extremities normal, atraumatic, no cyanosis or edema Pulses: 2+ and symmetric Skin: Skin color, texture, turgor normal. No rashes or lesions Neurologic: Alert and oriented X 3, normal strength and tone. Normal symmetric reflexes. Normal coordination and gait.      Assessment & Plan:   Problem List Items Addressed This Visit    Hypertension    Well controlled on current regimen. Renal function stable, no changes today.  Lab Results  Component Value Date   CREATININE 1.19 07/09/2017   Lab Results  Component Value Date   NA 145 07/09/2017   K 4.7 07/09/2017   CL 106 07/09/2017   CO2 26 07/09/2017         Hyperlipidemia    LDL has been at goal on current medications . She has no side effects , and LFTs are normal . No changes today  Lab Results  Component Value Date   CHOL 152 07/09/2017   HDL 56.50 07/09/2017   LDLCALC 64 07/09/2017   LDLDIRECT 85.0 05/30/2015   TRIG 154.0 (H) 07/09/2017   CHOLHDL 3 07/09/2017    Lab Results  Component Value Date   ALT  17 07/09/2017   AST 23 07/09/2017   ALKPHOS 55 07/09/2017   BILITOT 0.4 07/09/2017           Relevant Orders   Comprehensive metabolic panel (Completed)   Lipid panel (Completed)   Hepatic steatosis    Suggested by appearance of liver on April non contrasted CT.  Continue statin, lifestyle mofications and weight loss.  Hep a and B vaccination series completed.       Encounter for preventive health examination    Annual comprehensive preventive exam was done as well as an evaluation and management of chronic conditions .  During the course of the visit the patient was educated and counseled about appropriate screening and preventive services including :  diabetes screening, lipid analysis with projected  10 year  risk for CAD , nutrition counseling, breast, cervical and colorectal cancer screening, and recommended immunizations.  Printed recommendations for health maintenance screenings was given       Other Visit Diagnoses    Neuropathy    -  Primary   Relevant Orders   RPR (Completed)   B12 (Completed)   RBC Folate (Completed)   Hemoglobin A1c (Completed)   TSH (Completed)   Need for hepatitis A and B vaccination       Relevant Orders   Hepatitis A hepatitis B combined vaccine IM (Completed)      I have discontinued Belenda Cruise A. Whitener's omeprazole. I am also having her start on Zoster Vaccine Adjuvanted. Additionally, I am having her maintain her Vitamin D3, ibuprofen, aspirin, fluticasone, famotidine, cetirizine, Fluocinolone Acetonide, NON FORMULARY, simvastatin, enalapril, amLODipine, and lactulose.  Meds ordered this encounter  Medications  . Zoster Vaccine Adjuvanted Brand [facility]) injection    Sig: Inject 0.5 mLs into the muscle once for 1 dose.    Dispense:  1 each    Refill:  1    Medications Discontinued During This Encounter  Medication Reason  . omeprazole (PRILOSEC) 20 MG capsule Change in therapy    Follow-up: Return in about 6 months (around  01/09/2018).   Crecencio Mc, MD

## 2017-07-10 LAB — RPR: RPR Ser Ql: NONREACTIVE

## 2017-07-10 LAB — FOLATE RBC: RBC Folate: 827 ng/mL RBC (ref >280)

## 2017-07-10 NOTE — Assessment & Plan Note (Signed)
Suggested by appearance of liver on April non contrasted CT.  Continue statin, lifestyle mofications and weight loss.  Hep a and B vaccination series completed.

## 2017-07-10 NOTE — Assessment & Plan Note (Signed)
LDL has been at goal on current medications . She has no side effects , and LFTs are normal . No changes today  Lab Results  Component Value Date   CHOL 152 07/09/2017   HDL 56.50 07/09/2017   LDLCALC 64 07/09/2017   LDLDIRECT 85.0 05/30/2015   TRIG 154.0 (H) 07/09/2017   CHOLHDL 3 07/09/2017    Lab Results  Component Value Date   ALT 17 07/09/2017   AST 23 07/09/2017   ALKPHOS 55 07/09/2017   BILITOT 0.4 07/09/2017

## 2017-07-10 NOTE — Assessment & Plan Note (Addendum)
Well controlled on current regimen. Renal function stable, no changes today.  Lab Results  Component Value Date   CREATININE 1.19 07/09/2017   Lab Results  Component Value Date   NA 145 07/09/2017   K 4.7 07/09/2017   CL 106 07/09/2017   CO2 26 07/09/2017

## 2017-07-10 NOTE — Assessment & Plan Note (Signed)
Annual comprehensive preventive exam was done as well as an evaluation and management of chronic conditions .  During the course of the visit the patient was educated and counseled about appropriate screening and preventive services including :  diabetes screening, lipid analysis with projected  10 year  risk for CAD , nutrition counseling, breast, cervical and colorectal cancer screening, and recommended immunizations.  Printed recommendations for health maintenance screenings was given 

## 2017-07-11 ENCOUNTER — Encounter: Payer: Self-pay | Admitting: Internal Medicine

## 2017-07-11 ENCOUNTER — Other Ambulatory Visit: Payer: Self-pay | Admitting: Internal Medicine

## 2017-07-11 DIAGNOSIS — N183 Chronic kidney disease, stage 3 unspecified: Secondary | ICD-10-CM | POA: Insufficient documentation

## 2017-07-11 DIAGNOSIS — R944 Abnormal results of kidney function studies: Secondary | ICD-10-CM

## 2017-07-11 DIAGNOSIS — E538 Deficiency of other specified B group vitamins: Secondary | ICD-10-CM | POA: Insufficient documentation

## 2017-07-16 ENCOUNTER — Ambulatory Visit: Payer: Medicare Other

## 2017-07-19 ENCOUNTER — Ambulatory Visit (INDEPENDENT_AMBULATORY_CARE_PROVIDER_SITE_OTHER): Payer: Medicare Other | Admitting: *Deleted

## 2017-07-19 DIAGNOSIS — R944 Abnormal results of kidney function studies: Secondary | ICD-10-CM

## 2017-07-19 DIAGNOSIS — E538 Deficiency of other specified B group vitamins: Secondary | ICD-10-CM | POA: Diagnosis not present

## 2017-07-19 LAB — RENAL FUNCTION PANEL
Albumin: 4.1 g/dL (ref 3.5–5.2)
BUN: 27 mg/dL — AB (ref 6–23)
CO2: 26 mEq/L (ref 19–32)
Calcium: 9.3 mg/dL (ref 8.4–10.5)
Chloride: 106 mEq/L (ref 96–112)
Creatinine, Ser: 1.13 mg/dL (ref 0.40–1.20)
GFR: 50.9 mL/min — ABNORMAL LOW (ref >60.00)
GLUCOSE: 90 mg/dL (ref 70–99)
Phosphorus: 2.3 mg/dL (ref 2.3–4.6)
Potassium: 4.8 mEq/L (ref 3.5–5.1)
SODIUM: 139 meq/L (ref 135–145)

## 2017-07-19 MED ORDER — CYANOCOBALAMIN 1000 MCG/ML IJ SOLN
1000.0000 ug | Freq: Once | INTRAMUSCULAR | Status: AC
  Administered 2017-07-19: 1000 ug via INTRAMUSCULAR

## 2017-07-19 NOTE — Progress Notes (Signed)
Patient presented for B 12 injection to left deltoid, patient voiced no concerns nor showed any signs of distress during injection. 

## 2017-07-21 ENCOUNTER — Other Ambulatory Visit: Payer: Self-pay | Admitting: Internal Medicine

## 2017-07-21 LAB — INTRINSIC FACTOR ANTIBODIES: INTRINSIC FACTOR: POSITIVE — AB

## 2017-07-22 ENCOUNTER — Encounter: Payer: Self-pay | Admitting: Internal Medicine

## 2017-07-25 ENCOUNTER — Encounter: Payer: Self-pay | Admitting: Internal Medicine

## 2017-07-25 DIAGNOSIS — N183 Chronic kidney disease, stage 3 unspecified: Secondary | ICD-10-CM

## 2017-08-01 ENCOUNTER — Ambulatory Visit (INDEPENDENT_AMBULATORY_CARE_PROVIDER_SITE_OTHER): Payer: Medicare Other

## 2017-08-01 DIAGNOSIS — E538 Deficiency of other specified B group vitamins: Secondary | ICD-10-CM | POA: Diagnosis not present

## 2017-08-01 MED ORDER — CYANOCOBALAMIN 1000 MCG/ML IJ SOLN
1000.0000 ug | Freq: Once | INTRAMUSCULAR | Status: AC
  Administered 2017-08-01: 1000 ug via INTRAMUSCULAR

## 2017-08-01 NOTE — Progress Notes (Signed)
  I have reviewed the above information and agree with above.   Dravin Lance, MD 

## 2017-08-01 NOTE — Progress Notes (Signed)
B12 injection given in right deltoid patient tolerated well 

## 2017-08-08 ENCOUNTER — Ambulatory Visit: Payer: Medicare Other

## 2017-08-08 ENCOUNTER — Ambulatory Visit (INDEPENDENT_AMBULATORY_CARE_PROVIDER_SITE_OTHER): Payer: Medicare Other | Admitting: Internal Medicine

## 2017-08-08 ENCOUNTER — Encounter: Payer: Self-pay | Admitting: Internal Medicine

## 2017-08-08 VITALS — BP 128/84 | HR 62 | Temp 97.7°F | Resp 18 | Ht 66.0 in | Wt 205.5 lb

## 2017-08-08 DIAGNOSIS — E538 Deficiency of other specified B group vitamins: Secondary | ICD-10-CM

## 2017-08-08 DIAGNOSIS — M436 Torticollis: Secondary | ICD-10-CM | POA: Diagnosis not present

## 2017-08-08 MED ORDER — PREDNISONE 10 MG PO TABS: ORAL_TABLET | ORAL | 0 refills | Status: DC

## 2017-08-08 MED ORDER — TRAMADOL HCL 50 MG PO TABS: 50.0000 mg | ORAL_TABLET | Freq: Three times a day (TID) | ORAL | 0 refills | Status: DC | PRN

## 2017-08-08 MED ORDER — TIZANIDINE HCL 2 MG PO TABS: 2.0000 mg | ORAL_TABLET | Freq: Four times a day (QID) | ORAL | 0 refills | Status: DC | PRN

## 2017-08-08 MED ORDER — CYANOCOBALAMIN 1000 MCG/ML IJ SOLN
1000.0000 ug | Freq: Once | INTRAMUSCULAR | Status: AC
  Administered 2017-08-08: 1000 ug via INTRAMUSCULAR

## 2017-08-08 NOTE — Progress Notes (Signed)
Subjective:  Patient ID: Jillian [name], female    DOB: [birth year]  Age: 66 y.o. MRN: [id number]  CC: The primary encounter diagnosis was B12 deficiency. A diagnosis of Acute torticollis was also pertinent to this visit.  HPI Jillian [name] presents for painful neck.  She states that when she woke up  2 days ago she was unable to turn head to the left. Sleeps usually on her sides,  Uses a gel pillow .  No recent unusual activity except she may hve carried a heavy bag the day before,  But not a shoulder bag.  she denies headache, fevers, arm pai n and numbness involving either arm . She has no history of cervical disk disease   Outpatient Medications Prior to Visit  Medication Sig Dispense Refill  . amLODipine (NORVASC) 2.5 MG tablet TAKE ONE TABLET BY MOUTH DAILY 30 tablet 2  . aspirin 81 MG tablet Take 81 mg by mouth daily.      . cetirizine (ZYRTEC) 10 MG tablet Take 10 mg by mouth at bedtime.    . Cholecalciferol (VITAMIN D3) 1000 UNITS CAPS Take 1,000 Units by mouth daily.     . enalapril (VASOTEC) 20 MG tablet TAKE ONE TABLET BY MOUTH TWICE A DAY 60 tablet 0  . famotidine (PEPCID) 10 MG tablet Take 10 mg by mouth daily.    . Fluocinolone Acetonide 0.01 % OIL Apply 1 application topically See admin instructions. Apply to affected ear daily as needed for dermatisis    . fluticasone (FLONASE) 50 MCG/ACT nasal spray PLACE 2 SPRAYS INTO THE NOSE DAILY. 16 g 4  . ibuprofen (ADVIL,MOTRIN) 200 MG tablet Take 400 mg by mouth every 6 (six) hours as needed for headache or moderate pain.     Marland Kitchen lactulose (CHRONULAC) 10 GM/15ML solution TAKE 30 MILLILITERS EVERY 4 HOURS UNTIL CONSTIPATION IS RELIEVED 473 mL 2  . NON FORMULARY Apply 1 application topically daily as needed (for dermatisis on face). Hydrocortisone 2.5%/Ketoconazole 2% Cream    . simvastatin (ZOCOR) 40 MG tablet TAKE 1 TABLET BY MOUTH EVERY NIGHT AT BEDTIME 90 tablet 1   No facility-administered medications prior to visit.       Review of Systems;  Patient denies headache, fevers, malaise, unintentional weight loss, skin rash, eye pain, sinus congestion and sinus pain, sore throat, dysphagia,  hemoptysis , cough, dyspnea, wheezing, chest pain, palpitations, orthopnea, edema, abdominal pain, nausea, melena, diarrhea, constipation, flank pain, dysuria, hematuria, urinary  Frequency, nocturia, numbness, tingling, seizures,  Focal weakness, Loss of consciousness,  Tremor, insomnia, depression, anxiety, and suicidal ideation.      Objective:  BP 128/84 (BP Location: Left Arm, Patient Position: Sitting, Cuff Size: Large)   Pulse 62   Temp 97.7 F (36.5 C) (Oral)   Resp 18   Ht 5\' 6"  (1.676 m)   Wt 205 lb 8 oz (93.2 kg)   SpO2 98%   BMI 33.17 kg/m   BP Readings from Last 3 Encounters:  08/08/17 128/84  07/09/17 110/68  04/30/17 133/66    Wt Readings from Last 3 Encounters:  08/08/17 205 lb 8 oz (93.2 kg)  07/09/17 204 lb 3.2 oz (92.6 kg)  04/30/17 198 lb (89.8 kg)    General appearance: alert, cooperative and appears stated age Ears: normal TM's and external ear canals both ears Throat: lips, mucosa, and tongue normal; teeth and gums normal Neck: no adenopathy, no carotid bruit, supple, symmetrical, trachea midline and thyroid not enlarged, symmetric, no tenderness/mass/nodules. Tender  to palpation over the left trapezius muscle and left SCM muscle.  Back: symmetric, no curvature. ROM normal. No CVA tenderness. Lungs: clear to auscultation bilaterally Heart: regular rate and rhythm, S1, S2 normal, no murmur, click, rub or gallop Abdomen: soft, non-tender; bowel sounds normal; no masses,  no organomegaly Pulses: 2+ and symmetric Skin: Skin color, texture, turgor normal. No rashes or lesions Lymph nodes: Cervical, supraclavicular, and axillary nodes normal. .Neuro: CNs 2-12 intact. DTRs 2+/4 in biceps, brachioradialis, patellars and achilles. Muscle strength 5/5 in upper and lower exremities. Fine  resting tremor bilaterally both hands cerebellar function normal. Romberg negative.  No pronator drift.   Gait normal.   Lab Results  Component Value Date   HGBA1C 5.6 07/09/2017   HGBA1C 5.7 01/09/2017    Lab Results  Component Value Date   CREATININE 1.13 07/19/2017   CREATININE 1.19 07/09/2017   CREATININE 1.06 01/09/2017    Lab Results  Component Value Date   WBC 7.3 06/11/2016   HGB 13.3 06/11/2016   HCT 39.6 06/11/2016   PLT 256.0 06/11/2016   GLUCOSE 90 07/19/2017   CHOL 152 07/09/2017   TRIG 154.0 (H) 07/09/2017   HDL 56.50 07/09/2017   LDLDIRECT 85.0 05/30/2015   LDLCALC 64 07/09/2017   ALT 17 07/09/2017   AST 23 07/09/2017   NA 139 07/19/2017   K 4.8 07/19/2017   CL 106 07/19/2017   CREATININE 1.13 07/19/2017   BUN 27 (H) 07/19/2017   CO2 26 07/19/2017   TSH 1.23 07/09/2017   HGBA1C 5.6 07/09/2017    No results found.  Assessment & Plan:   Problem List Items Addressed This Visit    B12 deficiency - Primary   Relevant Medications   cyanocobalamin ((VITAMIN B-12)) injection 1,000 mcg (Completed)   Acute torticollis    Exam is benign except for spasm of neck muscles. .  Prednisone taper (avoiding NSAID due to CKD) muscle relaxer, tylenol/tramadol for pain .  Advised to apply  Ice and heat alternating . If no improvement in 10 days,  ,  Neck films         A total of 25 minutes of face to face time was spent with patient more than half of which was spent in counselling about the above mentioned conditions  and coordination of care   I am having [name] start on tiZANidine, predniSONE, and traMADol. I am also having her maintain her Vitamin D3, ibuprofen, aspirin, fluticasone, famotidine, cetirizine, Fluocinolone Acetonide, NON FORMULARY, simvastatin, amLODipine, lactulose, and enalapril. We administered cyanocobalamin.  Meds ordered this encounter  Medications  . tiZANidine (ZANAFLEX) 2 MG tablet    Sig: Take 1 tablet (2 mg total) by  mouth every 6 (six) hours as needed for muscle spasms.    Dispense:  30 tablet    Refill:  0  . predniSONE (DELTASONE) 10 MG tablet    Sig: 6 tablets on Day 1 , then reduce by 1 tablet daily until gone    Dispense:  21 tablet    Refill:  0  . traMADol (ULTRAM) 50 MG tablet    Sig: Take 1 tablet (50 mg total) by mouth every 8 (eight) hours as needed.    Dispense:  30 tablet    Refill:  0  . cyanocobalamin ((VITAMIN B-12)) injection 1,000 mcg    There are no discontinued medications.  Follow-up: No follow-ups on file.   Crecencio Mc, MD

## 2017-08-08 NOTE — Progress Notes (Signed)
Pre-visit discussion using our clinic review tool. No additional management support is needed unless otherwise documented below in the visit note.  

## 2017-08-08 NOTE — Patient Instructions (Signed)
I am prescribing a prednisone taper as your antii nflammatory,   Tizanidine for your muscle relaxer,  And tramadol for severe pain if tylenol 1000 mg ever 6 hours does not help .  You can use ice or heat every 3-4 hours ,  Whichever helps.  If the pain does not resolve in a week,  Come in for  X rays of your neck    Acute Torticollis, Adult Torticollis is a condition in which the muscles of the neck tighten (contract) abnormally, causing the neck to twist and the head to move into an unnatural position. Torticollis that develops suddenly is called acute torticollis. People with acute torticollis may have trouble turning their head. The condition can be painful and may range from mild to severe. What are the causes? This condition may be caused by:  Sleeping in an awkward position (common).  Extending or twisting the neck muscles beyond their normal position.  An injury to the neck muscles.  An infection.  A tumor.  Certain medicines.  Long-lasting spasms of the neck muscles.  In some cases, the cause may not be known. What increases the risk? You are more likely to develop this condition if:  You have a condition associated with loose ligaments, such as Down syndrome.  You have a brain condition that affects vision, such as strabismus.  What are the signs or symptoms? The main symptom of this condition is tilting of the head to one side. Other symptoms include:  Pain in the neck.  Trouble turning the head from side to side or up and down.  How is this diagnosed? This condition may be diagnosed based on:  A physical exam.  Your medical history.  Imaging tests, such as: ? An X-ray. ? An ultrasound. ? A CT scan. ? An MRI.  How is this treated? Treatment for this condition depends on what is causing the condition. Mild cases may go away without treatment. Treatment for more serious cases may include:  Medicines or shots to relax the muscles.  Other medicines, such  as antibiotics to treat the underlying cause.  Wearing a soft neck collar.  Physical therapy and stretching to improve neck strength and flexibility.  Neck massage.  In severe cases, surgery may be needed to repair dislocated or broken bones or to treat nerves in the neck. Follow these instructions at home:  Take over-the-counter and prescription medicines only as told by your health care provider.  Do stretching exercises and massage your neck as told by your health care provider.  If directed, apply heat to the affected area as often as told by your health care provider. Use the heat source that your health care provider recommends, such as a moist heat pack or a heating pad. ? Place a towel between your skin and the heat source. ? Leave the heat on for 20-30 minutes. ? Remove the heat if your skin turns bright red. This is especially important if you are unable to feel pain, heat, or cold. You may have a greater risk of getting burned.  If you wake up with torticollis after sleeping, check your bed or sleeping area. Look for lumpy pillows or unusual objects. Make sure your bed and sleeping area are comfortable.  Keep all follow-up visits as told by your health care provider. This is important. Contact a health care provider if:  You have a fever.  Your symptoms do not improve or they get worse. Get help right away if:  You  have trouble breathing.  You develop noisy breathing (stridor).  You start to drool.  You have trouble swallowing or pain when swallowing.  You develop numbness or weakness in your hands or feet.  You have changes in your speech, understanding, or vision.  You are in severe pain.  You cannot move your head or neck. Summary  Torticollis is a condition in which the muscles of the neck tighten (contract) abnormally, causing the neck to twist and the head to move into an unnatural position. Torticollis that develops suddenly is called acute  torticollis.  Treatment for this condition depends on what is causing the condition. Mild cases may go away without treatment.  Do stretching exercises and massage your neck as told by your health care provider. You may also be instructed to apply heat to the area.  Contact your health care provider if your symptoms do not improve or they get worse. This information is not intended to replace advice given to you by your health care provider. Make sure you discuss any questions you have with your health care provider. Document Released: 02/10/2000 Document Revised: 04/12/2016 Document Reviewed: 04/12/2016 Elsevier Interactive Patient Education  [employer].

## 2017-08-11 DIAGNOSIS — M436 Torticollis: Secondary | ICD-10-CM | POA: Insufficient documentation

## 2017-08-11 NOTE — Assessment & Plan Note (Addendum)
Exam is benign except for spasm of neck muscles. .  Prednisone taper (avoiding NSAID due to CKD) muscle relaxer, tylenol/tramadol for pain .  Advised to apply  Ice and heat alternating . If no improvement in 10 days,  ,  Neck films

## 2017-08-23 ENCOUNTER — Other Ambulatory Visit: Payer: Self-pay | Admitting: Internal Medicine

## 2017-09-11 ENCOUNTER — Ambulatory Visit (INDEPENDENT_AMBULATORY_CARE_PROVIDER_SITE_OTHER): Payer: Medicare Other | Admitting: *Deleted

## 2017-09-11 ENCOUNTER — Ambulatory Visit: Payer: Medicare Other | Admitting: Internal Medicine

## 2017-09-11 DIAGNOSIS — E538 Deficiency of other specified B group vitamins: Secondary | ICD-10-CM

## 2017-09-11 MED ORDER — CYANOCOBALAMIN 1000 MCG/ML IJ SOLN
1000.0000 ug | Freq: Once | INTRAMUSCULAR | Status: AC
  Administered 2017-09-11: 1000 ug via INTRAMUSCULAR

## 2017-09-11 NOTE — Progress Notes (Signed)
Patient presented for B 12 injection to right deltoid, patient voiced no concerns nor showed any signs of distress during injection. 

## 2017-09-24 DIAGNOSIS — N281 Cyst of kidney, acquired: Secondary | ICD-10-CM | POA: Diagnosis not present

## 2017-09-24 DIAGNOSIS — N183 Chronic kidney disease, stage 3 (moderate): Secondary | ICD-10-CM | POA: Diagnosis not present

## 2017-09-24 DIAGNOSIS — I129 Hypertensive chronic kidney disease with stage 1 through stage 4 chronic kidney disease, or unspecified chronic kidney disease: Secondary | ICD-10-CM | POA: Diagnosis not present

## 2017-10-14 DIAGNOSIS — Z1283 Encounter for screening for malignant neoplasm of skin: Secondary | ICD-10-CM | POA: Diagnosis not present

## 2017-10-14 DIAGNOSIS — L578 Other skin changes due to chronic exposure to nonionizing radiation: Secondary | ICD-10-CM | POA: Diagnosis not present

## 2017-10-14 DIAGNOSIS — Z85828 Personal history of other malignant neoplasm of skin: Secondary | ICD-10-CM | POA: Diagnosis not present

## 2017-10-14 DIAGNOSIS — L821 Other seborrheic keratosis: Secondary | ICD-10-CM | POA: Diagnosis not present

## 2017-10-16 ENCOUNTER — Ambulatory Visit (INDEPENDENT_AMBULATORY_CARE_PROVIDER_SITE_OTHER): Payer: Medicare Other

## 2017-10-16 DIAGNOSIS — E538 Deficiency of other specified B group vitamins: Secondary | ICD-10-CM

## 2017-10-16 MED ORDER — CYANOCOBALAMIN 1000 MCG/ML IJ SOLN
1000.0000 ug | Freq: Once | INTRAMUSCULAR | Status: AC
  Administered 2017-10-16: 1000 ug via INTRAMUSCULAR

## 2017-10-16 NOTE — Progress Notes (Signed)
Patient presented for B 12 injection to left deltoid, patient voiced no concerns nor showed any signs of distress during injection. 

## 2017-10-21 ENCOUNTER — Other Ambulatory Visit: Payer: Self-pay | Admitting: Internal Medicine

## 2017-11-19 ENCOUNTER — Ambulatory Visit (INDEPENDENT_AMBULATORY_CARE_PROVIDER_SITE_OTHER): Payer: Medicare Other | Admitting: *Deleted

## 2017-11-19 DIAGNOSIS — E538 Deficiency of other specified B group vitamins: Secondary | ICD-10-CM

## 2017-11-19 MED ORDER — CYANOCOBALAMIN 1000 MCG/ML IJ SOLN
1000.0000 ug | Freq: Once | INTRAMUSCULAR | Status: AC
  Administered 2017-11-19: 1000 ug via INTRAMUSCULAR

## 2017-11-19 NOTE — Progress Notes (Signed)
Patient presented for B 12 injection to right deltoid, patient voiced no concerns nor showed any signs of distress during injection. 

## 2017-11-27 DIAGNOSIS — Z961 Presence of intraocular lens: Secondary | ICD-10-CM | POA: Diagnosis not present

## 2017-12-19 ENCOUNTER — Ambulatory Visit (INDEPENDENT_AMBULATORY_CARE_PROVIDER_SITE_OTHER): Payer: Medicare Other

## 2017-12-19 DIAGNOSIS — E538 Deficiency of other specified B group vitamins: Secondary | ICD-10-CM | POA: Diagnosis not present

## 2017-12-19 MED ORDER — CYANOCOBALAMIN 1000 MCG/ML IJ SOLN
1000.0000 ug | Freq: Once | INTRAMUSCULAR | Status: AC
  Administered 2017-12-19: 1000 ug via INTRAMUSCULAR

## 2017-12-19 NOTE — Progress Notes (Signed)
Pt was here today and was given B-12 shot in LD. Pt tolerated well.

## 2017-12-25 ENCOUNTER — Other Ambulatory Visit: Payer: Self-pay | Admitting: Internal Medicine

## 2017-12-25 DIAGNOSIS — Z1231 Encounter for screening mammogram for malignant neoplasm of breast: Secondary | ICD-10-CM

## 2018-01-09 ENCOUNTER — Ambulatory Visit (INDEPENDENT_AMBULATORY_CARE_PROVIDER_SITE_OTHER): Payer: Medicare Other | Admitting: Internal Medicine

## 2018-01-09 ENCOUNTER — Encounter: Payer: Self-pay | Admitting: Internal Medicine

## 2018-01-09 VITALS — BP 124/82 | HR 65 | Temp 98.5°F | Resp 14 | Ht 66.0 in | Wt 208.8 lb

## 2018-01-09 DIAGNOSIS — Z79899 Other long term (current) drug therapy: Secondary | ICD-10-CM

## 2018-01-09 DIAGNOSIS — N183 Chronic kidney disease, stage 3 unspecified: Secondary | ICD-10-CM

## 2018-01-09 DIAGNOSIS — R7301 Impaired fasting glucose: Secondary | ICD-10-CM

## 2018-01-09 DIAGNOSIS — E538 Deficiency of other specified B group vitamins: Secondary | ICD-10-CM | POA: Diagnosis not present

## 2018-01-09 DIAGNOSIS — I2510 Atherosclerotic heart disease of native coronary artery without angina pectoris: Secondary | ICD-10-CM | POA: Diagnosis not present

## 2018-01-09 DIAGNOSIS — R5383 Other fatigue: Secondary | ICD-10-CM

## 2018-01-09 LAB — CBC WITH DIFFERENTIAL/PLATELET
Basophils Absolute: 0 10*3/uL (ref 0.0–0.1)
Basophils Relative: 0.6 % (ref 0.0–3.0)
EOS ABS: 0.1 10*3/uL (ref 0.0–0.7)
EOS PCT: 1.7 % (ref 0.0–5.0)
HCT: 38.2 % (ref 36.0–46.0)
Hemoglobin: 12.7 g/dL (ref 12.0–15.0)
LYMPHS ABS: 2.5 10*3/uL (ref 0.7–4.0)
Lymphocytes Relative: 29.2 % (ref 12.0–46.0)
MCHC: 33.2 g/dL (ref 30.0–36.0)
MCV: 89.9 fl (ref 78.0–100.0)
MONO ABS: 0.7 10*3/uL (ref 0.1–1.0)
Monocytes Relative: 7.7 % (ref 3.0–12.0)
NEUTROS PCT: 60.8 % (ref 43.0–77.0)
Neutro Abs: 5.2 10*3/uL (ref 1.4–7.7)
Platelets: 206 10*3/uL (ref 150.0–400.0)
RBC: 4.25 Mil/uL (ref 3.87–5.11)
RDW: 13.5 % (ref 11.5–15.5)
WBC: 8.5 10*3/uL (ref 4.0–10.5)

## 2018-01-09 LAB — COMPREHENSIVE METABOLIC PANEL
ALT: 14 U/L (ref 0–35)
AST: 21 U/L (ref 0–37)
Albumin: 4.3 g/dL (ref 3.5–5.2)
Alkaline Phosphatase: 56 U/L (ref 39–117)
BUN: 17 mg/dL (ref 6–23)
CO2: 26 mEq/L (ref 19–32)
CREATININE: 1.02 mg/dL (ref 0.40–1.20)
Calcium: 9.3 mg/dL (ref 8.4–10.5)
Chloride: 107 mEq/L (ref 96–112)
GFR: 57.2 mL/min — ABNORMAL LOW (ref >60.00)
GLUCOSE: 93 mg/dL (ref 70–99)
POTASSIUM: 4.6 meq/L (ref 3.5–5.1)
SODIUM: 140 meq/L (ref 135–145)
TOTAL PROTEIN: 7 g/dL (ref 6.0–8.3)
Total Bilirubin: 0.4 mg/dL (ref 0.2–1.2)

## 2018-01-09 LAB — HEMOGLOBIN A1C: HEMOGLOBIN A1C: 5.6 % (ref 4.6–6.5)

## 2018-01-09 NOTE — Progress Notes (Signed)
Subjective:  Patient ID: Jillian [name], female    DOB: 1950/05/20  Age: 67 y.o. MRN: [id number]  CC: The primary encounter diagnosis was CKD (chronic kidney disease) stage 3, GFR 30-59 ml/min ([place]). Diagnoses of Long-term use of high-risk medication, Impaired fasting glucose, B12 deficiency, Atherosclerosis of native coronary artery of native heart without angina pectoris, and Fatigue, unspecified type were also pertinent to this visit.  HPI Jillian [name] presents for follow up on hypertension hyperlipidemia , and CKD  Saw Nephrology.Marland Kitchen  GFR change attributed to contrasted study done last year.  Renal cysts noted  Hypertension: patient checks blood pressure twice weekly at home.  Readings have been for the most part < 140/80 at rest . Patient is following a reduce salt diet most days and is taking medications as prescribed  Mammogram ordered   Neuropathy improving with b12 injections.   Still waking up tired most mornings  No hypersomnolence.  He does snore.    Outpatient Medications Prior to Visit  Medication Sig Dispense Refill  . amLODipine (NORVASC) 2.5 MG tablet TAKE ONE TABLET BY MOUTH DAILY 90 tablet 1  . aspirin 81 MG tablet Take 81 mg by mouth daily.      . cetirizine (ZYRTEC) 10 MG tablet Take 10 mg by mouth at bedtime.    . Cholecalciferol (VITAMIN D3) 1000 UNITS CAPS Take 1,000 Units by mouth daily.     . enalapril (VASOTEC) 20 MG tablet TAKE ONE TABLET BY MOUTH TWICE A DAY 180 tablet 1  . famotidine (PEPCID) 10 MG tablet Take 10 mg by mouth daily.    . Fluocinolone Acetonide 0.01 % OIL Apply 1 application topically See admin instructions. Apply to affected ear daily as needed for dermatisis    . fluticasone (FLONASE) 50 MCG/ACT nasal spray PLACE 2 SPRAYS INTO THE NOSE DAILY. 16 g 4  . lactulose (CHRONULAC) 10 GM/15ML solution TAKE 30 MILLILITERS EVERY 4 HOURS UNTIL CONSTIPATION IS RELIEVED 473 mL 2  . NON FORMULARY Apply 1 application topically daily as needed  (for dermatisis on face). Hydrocortisone 2.5%/Ketoconazole 2% Cream    . simvastatin (ZOCOR) 40 MG tablet TAKE ONE TABLET BY MOUTH EVERY NIGHT AT BEDTIME 90 tablet 1  . ibuprofen (ADVIL,MOTRIN) 200 MG tablet Take 400 mg by mouth every 6 (six) hours as needed for headache or moderate pain.     . predniSONE (DELTASONE) 10 MG tablet 6 tablets on Day 1 , then reduce by 1 tablet daily until gone 21 tablet 0  . tiZANidine (ZANAFLEX) 2 MG tablet Take 1 tablet (2 mg total) by mouth every 6 (six) hours as needed for muscle spasms. 30 tablet 0  . traMADol (ULTRAM) 50 MG tablet Take 1 tablet (50 mg total) by mouth every 8 (eight) hours as needed. 30 tablet 0   No facility-administered medications prior to visit.     Review of Systems;  Patient denies headache, fevers, malaise, unintentional weight loss, skin rash, eye pain, sinus congestion and sinus pain, sore throat, dysphagia,  hemoptysis , cough, dyspnea, wheezing, chest pain, palpitations, orthopnea, edema, abdominal pain, nausea, melena, diarrhea, constipation, flank pain, dysuria, hematuria, urinary  Frequency, nocturia, numbness, tingling, seizures,  Focal weakness, Loss of consciousness,  Tremor, insomnia, depression, anxiety, and suicidal ideation.    Lab Results  Component Value Date   HGBA1C 5.6 01/09/2018     Objective:  BP 124/82 (BP Location: Left Arm, Patient Position: Sitting, Cuff Size: Large)   Pulse 65   Temp 98.5 F (  36.9 C) (Oral)   Resp 14   Ht 5\' 6"  (1.676 m)   Wt 208 lb 12.8 oz (94.7 kg)   SpO2 98%   BMI 33.70 kg/m   BP Readings from Last 3 Encounters:  01/09/18 124/82  08/08/17 128/84  07/09/17 110/68    Wt Readings from Last 3 Encounters:  01/09/18 208 lb 12.8 oz (94.7 kg)  08/08/17 205 lb 8 oz (93.2 kg)  07/09/17 204 lb 3.2 oz (92.6 kg)    General appearance: alert, cooperative and appears stated age Ears: normal TM's and external ear canals both ears Throat: lips, mucosa, and tongue normal; teeth and  gums normal Neck: no adenopathy, no carotid bruit, supple, symmetrical, trachea midline and thyroid not enlarged, symmetric, no tenderness/mass/nodules Back: symmetric, no curvature. ROM normal. No CVA tenderness. Lungs: clear to auscultation bilaterally Heart: regular rate and rhythm, S1, S2 normal, no murmur, click, rub or gallop Abdomen: soft, non-tender; bowel sounds normal; no masses,  no organomegaly Pulses: 2+ and symmetric Skin: Skin color, texture, turgor normal. No rashes or lesions Lymph nodes: Cervical, supraclavicular, and axillary nodes normal.  Lab Results  Component Value Date   HGBA1C 5.6 01/09/2018   HGBA1C 5.6 07/09/2017   HGBA1C 5.7 01/09/2017    Lab Results  Component Value Date   CREATININE 1.02 01/09/2018   CREATININE 1.13 07/19/2017   CREATININE 1.19 07/09/2017    Lab Results  Component Value Date   WBC 8.5 01/09/2018   HGB 12.7 01/09/2018   HCT 38.2 01/09/2018   PLT 206.0 01/09/2018   GLUCOSE 93 01/09/2018   CHOL 152 07/09/2017   TRIG 154.0 (H) 07/09/2017   HDL 56.50 07/09/2017   LDLDIRECT 85.0 05/30/2015   LDLCALC 64 07/09/2017   ALT 14 01/09/2018   AST 21 01/09/2018   NA 140 01/09/2018   K 4.6 01/09/2018   CL 107 01/09/2018   CREATININE 1.02 01/09/2018   BUN 17 01/09/2018   CO2 26 01/09/2018   TSH 1.23 07/09/2017   HGBA1C 5.6 01/09/2018    No results found.  Assessment & Plan:   Problem List Items Addressed This Visit    Atherosclerosis of coronary artery    LAD, noted on CT abdomen . Asymptomatic.   Continue statin , aspirin and ACE inhibitor.       B12 deficiency    Intrinsic factor positive.  Continue monthly b12 injections   Lab Results  Component Value Date   VITAMINB12 248 07/09/2017         CKD (chronic kidney disease) stage 3, GFR 30-59 ml/min ([facility]) - Primary    Stable.  All modificiable risk factors for progression have been addressed.  Lab Results  Component Value Date   CREATININE 1.02 01/09/2018   No  results found for: Derl Barrow  Lab Results  Component Value Date   NA 140 01/09/2018   K 4.6 01/09/2018   CL 107 01/09/2018   CO2 26 01/09/2018         Relevant Orders   CBC with Differential/Platelet (Completed)   Fatigue    Associated with snoring .  Recommended to have sleep study        Other Visit Diagnoses    Long-term use of high-risk medication       Relevant Orders   Comprehensive metabolic panel (Completed)   Impaired fasting glucose       Relevant Orders   Hemoglobin A1c (Completed)    A total of 25 minutes of face to face time  was spent with patient more than half of which was spent in counselling about the above mentioned conditions  and coordination of care   I have discontinued Belenda Cruise A. Duerson's ibuprofen, tiZANidine, predniSONE, and traMADol. I am also having her maintain her Vitamin D3, aspirin, fluticasone, famotidine, cetirizine, Fluocinolone Acetonide, NON FORMULARY, lactulose, enalapril, amLODipine, and simvastatin.  No orders of the defined types were placed in this encounter.   Medications Discontinued During This Encounter  Medication Reason  . tiZANidine (ZANAFLEX) 2 MG tablet Error  . predniSONE (DELTASONE) 10 MG tablet Error  . traMADol (ULTRAM) 50 MG tablet Error  . ibuprofen (ADVIL,MOTRIN) 200 MG tablet     Follow-up: Return in about 6 months (around 07/10/2018).   Crecencio Mc, MD

## 2018-01-09 NOTE — Patient Instructions (Addendum)
Things we are doing To maintain your kidney function:   Avoid ibuprofen and aleve on a daily basis Tylenol can be used daily if needed.    Do not use more than 325 mg of aspirin daily ( because it can cause ulcers)    Keep blood pressure < 130/80    Keep cholesterol controlled   See you in 6 months

## 2018-01-11 DIAGNOSIS — R5383 Other fatigue: Secondary | ICD-10-CM | POA: Insufficient documentation

## 2018-01-11 NOTE — Assessment & Plan Note (Signed)
Intrinsic factor positive.  Continue monthly b12 injections   Lab Results  Component Value Date   VITAMINB12 248 07/09/2017

## 2018-01-11 NOTE — Assessment & Plan Note (Signed)
Associated with snoring .  Recommended to have sleep study

## 2018-01-11 NOTE — Assessment & Plan Note (Signed)
Stable.  All modificiable risk factors for progression have been addressed.  Lab Results  Component Value Date   CREATININE 1.02 01/09/2018   No results found for: Derl Barrow  Lab Results  Component Value Date   NA 140 01/09/2018   K 4.6 01/09/2018   CL 107 01/09/2018   CO2 26 01/09/2018

## 2018-01-11 NOTE — Assessment & Plan Note (Signed)
LAD, noted on CT abdomen . Asymptomatic.   Continue statin , aspirin and ACE inhibitor.

## 2018-01-22 ENCOUNTER — Other Ambulatory Visit: Payer: Self-pay

## 2018-01-22 ENCOUNTER — Ambulatory Visit (INDEPENDENT_AMBULATORY_CARE_PROVIDER_SITE_OTHER): Payer: Medicare Other

## 2018-01-22 DIAGNOSIS — E538 Deficiency of other specified B group vitamins: Secondary | ICD-10-CM

## 2018-01-22 MED ORDER — CYANOCOBALAMIN 1000 MCG/ML IJ SOLN
1000.0000 ug | Freq: Once | INTRAMUSCULAR | Status: AC
  Administered 2018-01-22: 1000 ug via INTRAMUSCULAR

## 2018-01-22 MED ORDER — CYANOCOBALAMIN 1000 MCG/ML IJ SOLN: 1000.0000 ug | Freq: Once | INTRAMUSCULAR | Status: DC

## 2018-01-22 NOTE — Progress Notes (Signed)
Patient presents for monthly B12 injection. Administered RD. No verbal complaints during or post administration. Schedule as directed.

## 2018-02-10 ENCOUNTER — Ambulatory Visit
Admission: RE | Admit: 2018-02-10 | Discharge: 2018-02-10 | Disposition: A | Discharge status: Home / Self Care | Payer: Medicare Other | Source: Ambulatory Visit | Attending: Internal Medicine | Admitting: Internal Medicine

## 2018-02-10 DIAGNOSIS — Z1231 Encounter for screening mammogram for malignant neoplasm of breast: Secondary | ICD-10-CM | POA: Insufficient documentation

## 2018-02-17 ENCOUNTER — Ambulatory Visit: Payer: Medicare Other

## 2018-02-18 ENCOUNTER — Other Ambulatory Visit: Payer: Self-pay | Admitting: Internal Medicine

## 2018-02-21 ENCOUNTER — Ambulatory Visit (INDEPENDENT_AMBULATORY_CARE_PROVIDER_SITE_OTHER): Payer: Medicare Other

## 2018-02-21 VITALS — BP 130/74 | HR 76 | Temp 98.1°F | Resp 15 | Ht 66.0 in | Wt 207.0 lb

## 2018-02-21 DIAGNOSIS — Z0000 Encounter for general adult medical examination without abnormal findings: Secondary | ICD-10-CM | POA: Diagnosis not present

## 2018-02-21 DIAGNOSIS — E538 Deficiency of other specified B group vitamins: Secondary | ICD-10-CM | POA: Diagnosis not present

## 2018-02-21 MED ORDER — CYANOCOBALAMIN 1000 MCG/ML IJ SOLN
1000.0000 ug | Freq: Once | INTRAMUSCULAR | Status: AC
  Administered 2018-02-21: 1000 ug via INTRAMUSCULAR

## 2018-02-21 NOTE — Progress Notes (Signed)
Subjective:   Jillian [name] is a 67 y.o. female who presents for Medicare Annual (Subsequent) preventive examination.  Review of Systems:  No ROS.  Medicare Wellness Visit. Additional risk factors are reflected in the social history. Cardiac Risk Factors include: advanced age (>37men, >69 women);hypertension     Objective:     Vitals: BP 130/74 (BP Location: Left Arm, Patient Position: Sitting, Cuff Size: Normal)   Pulse 76   Temp 98.1 F (36.7 C) (Oral)   Resp 15   Ht 5\' 6"  (1.676 m)   Wt 207 lb (93.9 kg)   SpO2 96%   BMI 33.41 kg/m   Body mass index is 33.41 kg/m.  Advanced Directives 02/21/2018 04/09/2017 02/14/2017 02/07/2016  Does Patient Have a Medical Advance Directive? Yes Yes Yes Yes  Type of Advance Directive Living will;Healthcare Power of Attorney Living will;Healthcare Power of Attorney Living will;Healthcare Power of Attorney Living will  Does patient want to make changes to medical advance directive? No - Patient declined No - Patient declined No - Patient declined No - Patient declined  Copy of [place] in Chart? No - copy requested No - copy requested No - copy requested -    Tobacco Social History   Tobacco Use  Smoking Status Former Smoker  . Types: Cigarettes  . Last attempt to quit: 03/16/1999  . Years since quitting: 18.9  Smokeless Tobacco Never Used     Counseling given: Not Answered   Clinical Intake:  Pre-visit preparation completed: Yes  Pain : No/denies pain     Diabetes: No  How often do you need to have someone help you when you read instructions, pamphlets, or other written materials from your doctor or pharmacy?: 1 - Never  Interpreter Needed?: No     Past Medical History:  Diagnosis Date  . Allergy    seasonal rhinitis  . Arthritis    right shoulder,  no intervention  . Cancer ([place])    basal cell   . GERD (gastroesophageal reflux disease)   . Hyperlipidemia   . Hypertension   .  Irritable bowel syndrome (IBS) Oct 2011   last colonoscopy showed diverticulum, no polyps  . Neuropathy    FEET  . Vertigo    OCCAS   Past Surgical History:  Procedure Laterality Date  . CATARACT EXTRACTION W/PHACO Left 04/09/2017   Procedure: CATARACT EXTRACTION PHACO AND INTRAOCULAR LENS PLACEMENT (IOC);  Surgeon: Birder Robson, MD;  Location: [facility] ORS;  Service: Ophthalmology;  Laterality: Left;  [place] 00:29.6 AP% 18.4 CDE 5.44 FLUID PACK LOT # [id number]  . CATARACT EXTRACTION W/PHACO Right 04/30/2017   Procedure: CATARACT EXTRACTION PHACO AND INTRAOCULAR LENS PLACEMENT (IOC);  Surgeon: Birder Robson, MD;  Location: [facility] ORS;  Service: Ophthalmology;  Laterality: Right;  [place] 00:43.1 AP% 13.7 CDE 5.91 Fluid Pack Lot # [id number]  . TONSILLECTOMY AND ADENOIDECTOMY  1956  . TUBAL LIGATION  1977  . VEIN SURGERY     Family History  Problem Relation Age of Onset  . Hyperlipidemia Mother   . Hyperlipidemia Father   . Hypertension Father   . Hyperlipidemia Maternal Grandmother   . Hyperlipidemia Maternal Grandfather   . Cancer Cousin   . Breast cancer Neg Hx    Social History   Socioeconomic History  . Marital status: Divorced    Spouse name: Not on file  . Number of children: Not on file  . Years of education: Not on file  . Highest education level: Not  on file  Occupational History  . Occupation: cutsodial    Employer: [employer]    Comment: works at [employer]  . Financial resource strain: Not hard at all  . Food insecurity:    Worry: Never true    Inability: Never true  . Transportation needs:    Medical: No    Non-medical: No  Tobacco Use  . Smoking status: Former Smoker    Types: Cigarettes    Last attempt to quit: 03/16/1999    Years since quitting: 18.9  . Smokeless tobacco: Never Used  Substance and Sexual Activity  . Alcohol use: No    Alcohol/week: 0.0 standard drinks  . Drug use: No  . Sexual activity: Not Currently  Lifestyle  . Physical  activity:    Days per week: 4 days    Minutes per session: 30 min  . Stress: Not at all  Relationships  . Social connections:    Talks on phone: Not on file    Gets together: Not on file    Attends religious service: Not on file    Active member of club or organization: Not on file    Attends meetings of clubs or organizations: Not on file    Relationship status: Divorced  Other Topics Concern  . Not on file  Social History Narrative  . Not on file    Outpatient Encounter Medications as of 02/21/2018  Medication Sig  . amLODipine (NORVASC) 2.5 MG tablet TAKE ONE TABLET BY MOUTH DAILY  . aspirin 81 MG tablet Take 81 mg by mouth daily.    . cetirizine (ZYRTEC) 10 MG tablet Take 10 mg by mouth at bedtime.  . Cholecalciferol (VITAMIN D3) 1000 UNITS CAPS Take 1,000 Units by mouth daily.   . enalapril (VASOTEC) 20 MG tablet TAKE ONE TABLET BY MOUTH TWICE A DAY  . famotidine (PEPCID) 10 MG tablet Take 10 mg by mouth daily.  . Fluocinolone Acetonide 0.01 % OIL Apply 1 application topically See admin instructions. Apply to affected ear daily as needed for dermatisis  . fluticasone (FLONASE) 50 MCG/ACT nasal spray PLACE 2 SPRAYS INTO THE NOSE DAILY.  Marland Kitchen lactulose (CHRONULAC) 10 GM/15ML solution TAKE 30 MILLILITERS EVERY 4 HOURS UNTIL CONSTIPATION IS RELIEVED  . NON FORMULARY Apply 1 application topically daily as needed (for dermatisis on face). Hydrocortisone 2.5%/Ketoconazole 2% Cream  . simvastatin (ZOCOR) 40 MG tablet TAKE ONE TABLET BY MOUTH EVERY NIGHT AT BEDTIME  . [EXPIRED] cyanocobalamin ((VITAMIN B-12)) injection 1,000 mcg    No facility-administered encounter medications on file as of 02/21/2018.     Activities of Daily Living In your present state of health, do you have any difficulty performing the following activities: 02/21/2018  Hearing? N  Vision? N  Difficulty concentrating or making decisions? N  Walking or climbing stairs? N  Dressing or bathing? N  Doing errands,  shopping? N  Preparing Food and eating ? N  Using the Toilet? N  In the past six months, have you accidently leaked urine? N  Do you have problems with loss of bowel control? N  Managing your Medications? N  Managing your Finances? N  Housekeeping or managing your Housekeeping? N  Some recent data might be hidden    Patient Care Team: Crecencio Mc, MD as PCP - General (Internal Medicine)    Assessment:   This is a routine wellness examination for [facility].  Monthly B12 injection administered LD. No verbal complaints during or post administration. Schedule as  directed, per physician order.    Health Screenings  Mammogram-02/10/18 Colonoscopy -12/17/08 Bone Density-11/24/14 Glaucoma -none reported Hearing-passes the whispers test Hemoglobin A1C-01/09/18 (5.6) Cholesterol -12/17/08 (152) Dental-dentures; partial. Visits every 6 months.   Social  Alcohol intake-none Smoking history- former Smokers in home? none Illicit drug use? none Exercise-walking. She plans to start water aerobics. Diet-regular.  She plans to monitor carb intake more.  Sexually Active- not currentlty  Safety  Patient feels safe at home. Patient does have smoke detectors at home. Patient does wear sunscreen or protective clothing when in direct sunlight.  Patient does wear seat belt when driving or riding with others.   Activities of Daily Living Patient can do their own household chores. Denies needing assistance with: driving, feeding themselves, getting from bed to chair, getting to the toilet, bathing/showering, dressing, managing money, climbing flight of stairs, or preparing meals.   Depression Screen Patient denies losing interest in daily life, feeling hopeless, or crying easily over simple problems.   Fall Screen Patient denies being afraid of falling or falling in the last year.   Memory Screen Patient denies problems with memory, misplacing items, and is able to balance  checkbook/bank accounts.  Patient is alert, normal appearance, oriented to person/place/and time. Correctly identified the president of the [place], recall of 3/3 objects, and performing simple calculations.  Patient displays appropriate judgement and can read correct time from watch face.   Immunizations The following Immunizations are up to date: Influenza, and pneumonia. TDAP and shingles discussed.   Other Providers Patient Care Team: Crecencio Mc, MD as PCP - General (Internal Medicine)  Exercise Activities and Dietary recommendations Current Exercise Habits: Home exercise routine, Type of exercise: walking, Time (Minutes): 30, Frequency (Times/Week): 4, Weekly Exercise (Minutes/Week): 120, Intensity: Mild  Goals      Patient Stated   . Increase physical activity (pt-stated)     Water aerobics       Fall Risk Fall Risk  02/21/2018 02/14/2017 06/07/2016 02/07/2016 11/29/2014  Falls in the past year? 0 No No No No   Depression Screen PHQ 2/9 Scores 02/21/2018 02/14/2017 06/07/2016 02/07/2016  PHQ - 2 Score 0 0 0 0  PHQ- 9 Score - 0 - -     Cognitive Function MMSE - Mini Mental State Exam 02/14/2017 02/07/2016  Orientation to time 5 5  Orientation to Place 5 5  Registration 3 3  Attention/ Calculation 5 5  Recall 3 3  Language- name 2 objects 2 2  Language- repeat 1 1  Language- follow 3 step command 3 3  Language- read & follow direction 1 1  Write a sentence 1 1  Copy design 1 1  Total score 30 30     6CIT Screen 02/21/2018  What Year? 0 points  What month? 0 points  What time? 0 points  Count back from 20 0 points  Months in reverse 0 points  Repeat phrase 0 points  Total Score 0   Immunization History  Administered Date(s) Administered  . Hep A / Hep B 01/09/2017, 02/14/2017, 07/09/2017  . Influenza, High Dose Seasonal PF 11/29/2014, 12/27/2015, 11/20/2016  . Influenza,inj,quad, With Preservative 12/06/2017  . Influenza-Unspecified 11/18/2011,  11/10/2012, 11/25/2013, 12/04/2017  . Pneumococcal Conjugate-13 11/29/2014  . Pneumococcal Polysaccharide-23 02/29/2016   Screening Tests Health Maintenance  Topic Date Due  . COLONOSCOPY  12/18/2018  . MAMMOGRAM  02/11/2019  . INFLUENZA VACCINE  Completed  . DEXA SCAN  Completed  . Hepatitis C Screening  Completed  .  PNA vac Low Risk Adult  Completed      Plan:    End of life planning; Advance aging; Advanced directives discussed. Copy of current HCPOA/Living Will requested.    I have personally reviewed and noted the following in the patient's chart:   . Medical and social history . Use of alcohol, tobacco or illicit drugs  . Current medications and supplements . Functional ability and status . Nutritional status . Physical activity . Advanced directives . List of other physicians . Hospitalizations, surgeries, and ER visits in previous 12 months . Vitals . Screenings to include cognitive, depression, and falls . Referrals and appointments  In addition, I have reviewed and discussed with patient certain preventive protocols, quality metrics, and best practice recommendations. A written personalized care plan for preventive services as well as general preventive health recommendations were provided to patient.     OBrien-Blaney, Caryssa Elzey L, LPN  [id number]/[id number]     I have reviewed the above information and agree with above.   Deborra Medina, MD

## 2018-02-21 NOTE — Patient Instructions (Addendum)
  [name] , Thank you for taking time to come for your Medicare Wellness Visit. I appreciate your ongoing commitment to your health goals. Please review the following plan we discussed and let me know if I can assist you in the future.   Bring a copy of your [place] and/or Living Will to be scanned into chart.  Have a great day! These are the goals we discussed: Goals      Patient Stated   . Increase physical activity (pt-stated)     Water aerobics       This is a list of the screening recommended for you and due dates:  Health Maintenance  Topic Date Due  . Colon Cancer Screening  12/18/2018  . Mammogram  02/11/2019  . Flu Shot  Completed  . DEXA scan (bone density measurement)  Completed  .  Hepatitis C: One time screening is recommended by Center for Disease Control  (CDC) for  adults born from [birth year] through 1966.   Completed  . Pneumonia vaccines  Completed

## 2018-03-20 ENCOUNTER — Other Ambulatory Visit: Payer: Self-pay | Admitting: Internal Medicine

## 2018-03-25 ENCOUNTER — Ambulatory Visit (INDEPENDENT_AMBULATORY_CARE_PROVIDER_SITE_OTHER): Payer: Medicare Other

## 2018-03-25 DIAGNOSIS — E538 Deficiency of other specified B group vitamins: Secondary | ICD-10-CM

## 2018-03-25 MED ORDER — CYANOCOBALAMIN 1000 MCG/ML IJ SOLN
1000.0000 ug | Freq: Once | INTRAMUSCULAR | Status: AC
  Administered 2018-03-25: 1000 ug via INTRAMUSCULAR

## 2018-03-25 NOTE — Progress Notes (Addendum)
Pt came in for monthly B12 injection. Given in RD. Tolerated well. No complaints or concerns at this time.   Reviewed.  Dr Nicki Reaper

## 2018-04-09 ENCOUNTER — Other Ambulatory Visit: Payer: Self-pay | Admitting: Internal Medicine

## 2018-04-14 ENCOUNTER — Ambulatory Visit: Payer: Self-pay

## 2018-04-14 NOTE — Telephone Encounter (Signed)
Patient called and says that she has passed blood in her stools twice this past week. She says it was first seen on Wednesday or Thursday of last week and it was mixed in the stool, bright red. She says it turned the water a little red, but not much and she saw a little on the tissue, but not much. She says it was more on the stool. She says she was a little constipated when she had bowel movements. She says her stomach hurts like when she's constipated, but not now. According to protocol, see PCP within 3 days, no availability with PCP, appointment scheduled for Wednesday, 04/16/18 at 71 with Philis Nettle, FNP, care advice given, patient verbalized understanding.  Reason for Disposition . MILD rectal bleeding (more than just a few drops or streaks)  Answer Assessment - Initial Assessment Questions 1. APPEARANCE of BLOOD: "What color is it?" "Is it passed separately, on the surface of the stool, or mixed in with the stool?"      Blood mixed in the stool, bright red 2. AMOUNT: "How much blood was passed?"      Right much 3. FREQUENCY: "How many times has blood been passed with the stools?"      Passed twice this week 4. ONSET: "When was the blood first seen in the stools?" (Days or weeks)      Last Wednesday or Thursday 5. DIARRHEA: "Is there also some diarrhea?" If so, ask: "How many diarrhea stools were passed in past 24 hours?"      No 6. CONSTIPATION: "Do you have constipation?" If so, "How bad is it?"     Maybe a little 7. RECURRENT SYMPTOMS: "Have you had blood in your stools before?" If so, ask: "When was the last time?" and "What happened that time?"      Only when constipated when I wiped 8. BLOOD THINNERS: "Do you take any blood thinners?" (e.g., Coumadin/warfarin, Pradaxa/dabigatran, aspirin)     Low dose aspirin every day 9. OTHER SYMPTOMS: "Do you have any other symptoms?"  (e.g., abdominal pain, vomiting, dizziness, fever)     Stomach hurts like constipated, but not now. 10.  PREGNANCY: "Is there any chance you are pregnant?" "When was your last menstrual period?"       No  Protocols used: RECTAL BLEEDING-A-AH

## 2018-04-16 ENCOUNTER — Ambulatory Visit (INDEPENDENT_AMBULATORY_CARE_PROVIDER_SITE_OTHER): Payer: Medicare Other | Admitting: Family Medicine

## 2018-04-16 ENCOUNTER — Encounter: Payer: Self-pay | Admitting: Family Medicine

## 2018-04-16 VITALS — BP 136/92 | HR 66 | Temp 98.2°F | Resp 20 | Ht 66.0 in | Wt 203.8 lb

## 2018-04-16 DIAGNOSIS — K5900 Constipation, unspecified: Secondary | ICD-10-CM

## 2018-04-16 DIAGNOSIS — K649 Unspecified hemorrhoids: Secondary | ICD-10-CM | POA: Diagnosis not present

## 2018-04-16 MED ORDER — PSYLLIUM 58.6 % PO POWD: 1.0000 | Freq: Every day | ORAL | 2 refills | Status: DC

## 2018-04-16 NOTE — Progress Notes (Signed)
Subjective:    Patient ID: Jillian [name], female    DOB: [birth year], 68 y.o.   MRN: [id number]  HPI   Patient presents to clinic complaining of bright red blood on her stool that happened 1 time last week, then 1 more time earlier this week.  States that her stool was tarry or black in color.  States it was a hard stool with bright red blood streaks on it and then a little bit of blood when she wiped her bottom after using the restroom.  Patient has not had any more red blood with stools or when wiping since Monday of this week.  Patient does report due to her IBS she does struggle with constipation at times.  Currently is not taking any sort of fiber supplement or stool softener.  Patient Active Problem List   Diagnosis Date Noted  . Fatigue 01/11/2018  . Acute torticollis 08/11/2017  . B12 deficiency 07/11/2017  . CKD (chronic kidney disease) stage 3, GFR 30-59 ml/min (HCC) 07/11/2017  . Atherosclerosis of coronary artery 01/12/2017  . Hepatic steatosis 01/12/2017  . Atherosclerosis of aorta ([place]) 06/26/2016  . Encounter for preventive health examination 12/29/2015  . Lactose intolerance 05/30/2015  . Gluten intolerance 05/30/2015  . Insomnia 03/30/2013  . Lumbago 03/30/2013  . Initial Medicare annual wellness visit 03/24/2012  . Venous insufficiency of leg 10/23/2011  . Obesity 06/18/2011  . Screening for breast cancer 12/14/2010  . Screening for cervical cancer 12/14/2010  . GERD (gastroesophageal reflux disease) 12/14/2010  . Arthritis   . Hyperlipidemia   . Hypertension   . Allergic rhinitis due to pollen    Social History   Tobacco Use  . Smoking status: Former Smoker    Types: Cigarettes    Last attempt to quit: 03/16/1999    Years since quitting: 19.1  . Smokeless tobacco: Never Used  Substance Use Topics  . Alcohol use: No    Alcohol/week: 0.0 standard drinks   Review of Systems  Constitutional: Negative for chills, fatigue and fever.  HENT: Negative  for congestion, ear pain, sinus pain and sore throat.   Eyes: Negative.   Respiratory: Negative for cough, shortness of breath and wheezing.   Cardiovascular: Negative for chest pain, palpitations and leg swelling.  Gastrointestinal: Negative for abdominal pain, diarrhea, nausea and vomiting. +constipation and blood on stool x2 Genitourinary: Negative for dysuria, frequency and urgency.  Musculoskeletal: Negative for arthralgias and myalgias.  Skin: Negative for color change, pallor and rash.  Neurological: Negative for syncope, light-headedness and headaches.  Psychiatric/Behavioral: The patient is not nervous/anxious.       Objective:   Physical Exam Vitals signs and nursing note reviewed.  Constitutional:      Appearance: She is well-developed.  HENT:     Head: Normocephalic and atraumatic.     Right Ear: External ear normal.     Left Ear: External ear normal.     Nose: Nose normal.  Eyes:     General: No scleral icterus.       Right eye: No discharge.        Left eye: No discharge.     Conjunctiva/sclera: Conjunctivae normal.     Pupils: Pupils are equal, round, and reactive to light.  Neck:     Musculoskeletal: Normal range of motion and neck supple.     Trachea: No tracheal deviation.  Cardiovascular:     Rate and Rhythm: Normal rate and regular rhythm.     Heart sounds: Normal  heart sounds. No murmur. No friction rub. No gallop.   Pulmonary:     Effort: Pulmonary effort is normal. No respiratory distress.     Breath sounds: Normal breath sounds.  Abdominal:     General: Bowel sounds are normal. There is no distension.     Palpations: Abdomen is soft.     Tenderness: There is no abdominal tenderness. There is no guarding or rebound.  Genitourinary:    Rectum: External hemorrhoid (small ext hemorrhoid) present.  Musculoskeletal: Normal range of motion.        General: No deformity.  Lymphadenopathy:     Cervical: No cervical adenopathy.  Skin:    General: Skin is  warm and dry.     Capillary Refill: Capillary refill takes less than 2 seconds.     Coloration: Skin is not pale.     Findings: No erythema.  Neurological:     Mental Status: She is alert and oriented to person, place, and time.  Psychiatric:        Mood and Affect: Mood normal.        Behavior: Behavior normal.     Vitals:   04/16/18 1046  BP: (!) 136/92  Pulse: 66  Resp: 20  Temp: 98.2 F (36.8 C)  SpO2: 97%      Assessment & Plan:   External hemorrhoid-small external hemorrhoid seen on exam.  Suspect that this is the culprit for patient's bright red blood on the stool and when wiping.  Offered to do CBC for patient, but she declined.  Constipation- patient will use Metamucil 1 scoop per day to help reduce her constipation.  Advised if she notices her stools are becoming too soft or she is going too much she can back off to taking Metamucil once every other day or once every third day.  Also encouraged to keep up good fluid intake.  Patient will keep regular scheduled follow-up with PCP as planned.  Advised to return to clinic sooner if any issues arise.

## 2018-04-17 ENCOUNTER — Other Ambulatory Visit: Payer: Self-pay | Admitting: Internal Medicine

## 2018-04-19 ENCOUNTER — Other Ambulatory Visit: Payer: Self-pay | Admitting: Internal Medicine

## 2018-04-29 ENCOUNTER — Ambulatory Visit (INDEPENDENT_AMBULATORY_CARE_PROVIDER_SITE_OTHER): Payer: Medicare Other | Admitting: *Deleted

## 2018-04-29 DIAGNOSIS — E538 Deficiency of other specified B group vitamins: Secondary | ICD-10-CM

## 2018-04-29 MED ORDER — CYANOCOBALAMIN 1000 MCG/ML IJ SOLN
1000.0000 ug | Freq: Once | INTRAMUSCULAR | Status: AC
  Administered 2018-04-29: 1000 ug via INTRAMUSCULAR

## 2018-04-29 NOTE — Progress Notes (Signed)
Pt came in for monthly B12 injection. Given in LD. Tolerated well.

## 2018-05-08 DIAGNOSIS — H26492 Other secondary cataract, left eye: Secondary | ICD-10-CM | POA: Diagnosis not present

## 2018-05-17 ENCOUNTER — Other Ambulatory Visit: Payer: Self-pay | Admitting: Internal Medicine

## 2018-05-19 ENCOUNTER — Other Ambulatory Visit: Payer: Self-pay | Admitting: Internal Medicine

## 2018-05-21 ENCOUNTER — Other Ambulatory Visit: Payer: Self-pay | Admitting: Internal Medicine

## 2018-06-03 ENCOUNTER — Other Ambulatory Visit: Payer: Self-pay

## 2018-06-03 ENCOUNTER — Ambulatory Visit (INDEPENDENT_AMBULATORY_CARE_PROVIDER_SITE_OTHER): Payer: Medicare Other

## 2018-06-03 DIAGNOSIS — E538 Deficiency of other specified B group vitamins: Secondary | ICD-10-CM | POA: Diagnosis not present

## 2018-06-03 MED ORDER — CYANOCOBALAMIN 1000 MCG/ML IJ SOLN
1000.0000 ug | Freq: Once | INTRAMUSCULAR | Status: AC
  Administered 2018-06-03: 14:00:00 1000 ug via INTRAMUSCULAR

## 2018-06-03 NOTE — Progress Notes (Signed)
Jillian [name] presents today for injection per MD orders. B12 injection administered SQ in right Upper Arm. Administration without incident. Patient tolerated well.  Nina,cma

## 2018-06-27 ENCOUNTER — Other Ambulatory Visit: Payer: Self-pay

## 2018-06-27 ENCOUNTER — Ambulatory Visit (INDEPENDENT_AMBULATORY_CARE_PROVIDER_SITE_OTHER): Payer: Medicare Other | Admitting: Family Medicine

## 2018-06-27 DIAGNOSIS — J019 Acute sinusitis, unspecified: Secondary | ICD-10-CM

## 2018-06-27 MED ORDER — METHYLPREDNISOLONE 4 MG PO TBPK: ORAL_TABLET | ORAL | 0 refills | Status: DC

## 2018-06-27 MED ORDER — AMOXICILLIN-POT CLAVULANATE 875-125 MG PO TABS: 1.0000 | ORAL_TABLET | Freq: Two times a day (BID) | ORAL | 0 refills | Status: DC

## 2018-06-27 NOTE — Progress Notes (Signed)
Patient ID: Jillian [name], female   DOB: 1950/07/03, 68 y.o.   MRN: [id number]  Virtual Visit via video Note  This visit type was conducted due to national recommendations for restrictions regarding the COVID-19 pandemic (e.g. social distancing).  This format is felt to be most appropriate for this patient at this time.  All issues noted in this document were discussed and addressed.  No physical exam was performed (except for noted visual exam findings with Video Visits).   I connected with Aldine Contes on 06/27/18 at 11:00 AM EDT by a video enabled telemedicine application or telephone and verified that I am speaking with the correct person using two identifiers. Location patient: home Location provider: [facility] Persons participating in the virtual visit: patient, provider  I discussed the limitations, risks, security and privacy concerns of performing an evaluation and management service by video and the availability of in person appointments. I also discussed with the patient that there may be a patient responsible charge related to this service. The patient expressed understanding and agreed to proceed.   HPI:  Patient and I connected via video due to complaint of sinus congestion, sinus pressure and drainage on back of throat.  States her symptoms have been present for little over a week, and she has been trying to help the drainage by taking her Zyrtec every day and doing saline nasal rinses.  Pressure just seems to be getting worse and when she blows her nose it is a thicker yellow color.  States previously when this is happened she will need antibiotic and some steroid to help reduce inflammation in sinuses.  Denies fever or chills.  Denies cough with phlegm.  Denies shortness of breath or wheezing.  Denies chest pain.  Denies GI or GU issues.  ROS: See pertinent positives and negatives per HPI.  Past Medical History:  Diagnosis Date  . Allergy    seasonal rhinitis   . Arthritis    right shoulder,  no intervention  . Cancer ([place])    basal cell   . GERD (gastroesophageal reflux disease)   . Hyperlipidemia   . Hypertension   . Irritable bowel syndrome (IBS) Oct 2011   last colonoscopy showed diverticulum, no polyps  . Neuropathy    FEET  . Vertigo    OCCAS    Past Surgical History:  Procedure Laterality Date  . CATARACT EXTRACTION W/PHACO Left 04/09/2017   Procedure: CATARACT EXTRACTION PHACO AND INTRAOCULAR LENS PLACEMENT (IOC);  Surgeon: Birder Robson, MD;  Location: [facility] ORS;  Service: Ophthalmology;  Laterality: Left;  [place] 00:29.6 AP% 18.4 CDE 5.44 FLUID PACK LOT # [id number]  . CATARACT EXTRACTION W/PHACO Right 04/30/2017   Procedure: CATARACT EXTRACTION PHACO AND INTRAOCULAR LENS PLACEMENT (IOC);  Surgeon: Birder Robson, MD;  Location: [facility] ORS;  Service: Ophthalmology;  Laterality: Right;  [place] 00:43.1 AP% 13.7 CDE 5.91 Fluid Pack Lot # [id number]  . TONSILLECTOMY AND ADENOIDECTOMY  1956  . TUBAL LIGATION  1977  . VEIN SURGERY      Family History  Problem Relation Age of Onset  . Hyperlipidemia Mother   . Hyperlipidemia Father   . Hypertension Father   . Hyperlipidemia Maternal Grandmother   . Hyperlipidemia Maternal Grandfather   . Cancer Cousin   . Breast cancer Neg Hx    Social History   Tobacco Use  . Smoking status: Former Smoker    Types: Cigarettes    Last attempt to quit: 03/16/1999    Years since quitting: 19.3  .  Smokeless tobacco: Never Used  Substance Use Topics  . Alcohol use: No    Alcohol/week: 0.0 standard drinks    Current Outpatient Medications:  .  amLODipine (NORVASC) 2.5 MG tablet, TAKE ONE TABLET BY MOUTH DAILY, Disp: 30 tablet, Rfl: 0 .  aspirin 81 MG tablet, Take 81 mg by mouth daily.  , Disp: , Rfl:  .  cetirizine (ZYRTEC) 10 MG tablet, Take 10 mg by mouth at bedtime., Disp: , Rfl:  .  Cholecalciferol (VITAMIN D3) 1000 UNITS CAPS, Take 1,000 Units by mouth daily. , Disp: , Rfl:  .  enalapril  (VASOTEC) 20 MG tablet, TAKE ONE TABLET BY MOUTH TWICE A DAY, Disp: 60 tablet, Rfl: 0 .  famotidine (PEPCID) 10 MG tablet, Take 10 mg by mouth daily., Disp: , Rfl:  .  Fluocinolone Acetonide 0.01 % OIL, Apply 1 application topically See admin instructions. Apply to affected ear daily as needed for dermatisis, Disp: , Rfl:  .  fluticasone (FLONASE) 50 MCG/ACT nasal spray, PLACE 2 SPRAYS INTO THE NOSE DAILY., Disp: 16 g, Rfl: 4 .  lactulose (CHRONULAC) 10 GM/15ML solution, TAKE 30 MILLILITERS EVERY 4 HOURS UNTIL CONSTIPATION IS RELIEVED, Disp: 473 mL, Rfl: 2 .  NON FORMULARY, Apply 1 application topically daily as needed (for dermatisis on face). Hydrocortisone 2.5%/Ketoconazole 2% Cream, Disp: , Rfl:  .  psyllium (METAMUCIL SMOOTH TEXTURE) 58.6 % powder, Take 1 packet by mouth daily., Disp: 283 g, Rfl: 2 .  simvastatin (ZOCOR) 40 MG tablet, TAKE ONE TABLET BY MOUTH EVERY NIGHT AT BEDTIME, Disp: 30 tablet, Rfl: 0  EXAM:  GENERAL: alert, oriented, appears well and in no acute distress  HEENT: atraumatic, conjunttiva clear, no obvious abnormalities on inspection of external nose and ears  NECK: normal movements of the head and neck  LUNGS: on inspection no signs of respiratory distress, breathing rate appears normal, no obvious gross SOB, gasping or wheezing  CV: no obvious cyanosis  MS: moves all visible extremities without noticeable abnormality  PSYCH/NEURO: pleasant and cooperative, no obvious depression or anxiety, speech and thought processing grossly intact  Creatinine clearance calculated and is 77 mL/min  ASSESSMENT AND PLAN:  Discussed the following assessment and plan:  Acute sinusitis, recurrence not specified, unspecified location - Plan: amoxicillin-clavulanate (AUGMENTIN) 875-125 MG tablet, methylPREDNISolone (MEDROL DOSEPAK) 4 MG TBPK tablet   Suspect patient has sinusitis infection.  We will treat with Augmentin course and Medrol taper down.  Advised to continue Zyrtec  and saline nasal rinses to help reduce nasal congestion and keep allergen symptoms at [place].  Also advised to increase fluid intake, get plenty of rest and do good handwashing.  Patient called clinic Monday a.m. on 06/30/2018 stating the Augmentin seems to be too tough on her stomach causing her to feel nauseous have some vomiting.  We will stop this medication and do amoxicillin 500 mg twice daily in place.   I discussed the assessment and treatment plan with the patient. The patient was provided an opportunity to ask questions and all were answered. The patient agreed with the plan and demonstrated an understanding of the instructions.   The patient was advised to call back or seek an in-person evaluation if the symptoms worsen or if the condition fails to improve as anticipated.   Jodelle Green, FNP

## 2018-06-27 NOTE — Telephone Encounter (Signed)
Copied from [place] [id number] [phone]. Topic: General - Inquiry >> Jun 26, 2018  4:55 PM Rainey Pines A wrote: Reason for CRM: Patient stated that she feels as though she has a sinus infection and wants to schedule an appointment.

## 2018-06-30 ENCOUNTER — Encounter: Payer: Self-pay | Admitting: Family Medicine

## 2018-06-30 ENCOUNTER — Telehealth: Payer: Self-pay | Admitting: Internal Medicine

## 2018-06-30 DIAGNOSIS — J019 Acute sinusitis, unspecified: Secondary | ICD-10-CM

## 2018-06-30 MED ORDER — AMOXICILLIN 500 MG PO CAPS: 500.0000 mg | ORAL_CAPSULE | Freq: Two times a day (BID) | ORAL | 0 refills | Status: DC

## 2018-06-30 NOTE — Addendum Note (Signed)
Addended by: Philis Nettle on: 06/30/2018 11:20 AM   Modules accepted: Orders

## 2018-06-30 NOTE — Telephone Encounter (Signed)
Please advise her to stop the Augmentin  Plain amoxicillin sent in instead

## 2018-06-30 NOTE — Telephone Encounter (Signed)
Copied from [place] [id number]. Topic: Quick Communication - Rx Refill/Question >> Jun 30, 2018  8:20 AM Robina Ade, Helene Kelp D wrote: Medication: amoxicillin-clavulanate (AUGMENTIN) 875-125 MG tablet  Has the patient contacted their pharmacy?yes but medication is causing her to vomit. And would like something else called in. (Agent: If no, request that the patient contact the pharmacy for the refill.) (Agent: If yes, when and what did the pharmacy advise?)  Preferred Pharmacy (with phone number or street name): [place], [place]  Agent: Please be advised that RX refills may take up to 3 business days. We ask that you follow-up with your pharmacy.

## 2018-06-30 NOTE — Telephone Encounter (Signed)
Called Pt and told her to Stop taking the Augmentin. Also told Pt that a plain Rx of Amoxicillin was sent in to the pharmacy.Pt stated she understood, with no questions.

## 2018-06-30 NOTE — Telephone Encounter (Signed)
I did not prescribe it.  Lauren guse did.  Please forward to her

## 2018-07-03 ENCOUNTER — Ambulatory Visit (INDEPENDENT_AMBULATORY_CARE_PROVIDER_SITE_OTHER): Payer: Medicare Other

## 2018-07-03 ENCOUNTER — Other Ambulatory Visit: Payer: Self-pay

## 2018-07-03 DIAGNOSIS — E538 Deficiency of other specified B group vitamins: Secondary | ICD-10-CM | POA: Diagnosis not present

## 2018-07-03 MED ORDER — CYANOCOBALAMIN 1000 MCG/ML IJ SOLN
1000.0000 ug | Freq: Once | INTRAMUSCULAR | Status: AC
  Administered 2018-07-03: 1000 ug via INTRAMUSCULAR

## 2018-07-03 NOTE — Progress Notes (Addendum)
Jillian [name] presents today for injection per MD orders. B12 injection administered IM in left Upper Arm. Administration without incident. Patient tolerated well.  Nina,cma  Reviewed.  Dr Nicki Reaper

## 2018-07-11 ENCOUNTER — Encounter: Payer: Medicare Other | Admitting: Internal Medicine

## 2018-07-18 ENCOUNTER — Other Ambulatory Visit: Payer: Self-pay | Admitting: Internal Medicine

## 2018-08-04 ENCOUNTER — Other Ambulatory Visit: Payer: Self-pay

## 2018-08-05 ENCOUNTER — Ambulatory Visit: Payer: Medicare Other

## 2018-08-05 ENCOUNTER — Encounter: Payer: Medicare Other | Admitting: Internal Medicine

## 2018-08-11 ENCOUNTER — Other Ambulatory Visit: Payer: Self-pay

## 2018-08-12 ENCOUNTER — Encounter: Payer: Self-pay | Admitting: Internal Medicine

## 2018-08-12 ENCOUNTER — Ambulatory Visit (INDEPENDENT_AMBULATORY_CARE_PROVIDER_SITE_OTHER): Payer: Medicare Other | Admitting: Internal Medicine

## 2018-08-12 ENCOUNTER — Other Ambulatory Visit: Payer: Self-pay

## 2018-08-12 VITALS — BP 122/78 | HR 66 | Temp 98.5°F | Resp 15 | Ht 66.0 in | Wt 204.2 lb

## 2018-08-12 DIAGNOSIS — I10 Essential (primary) hypertension: Secondary | ICD-10-CM

## 2018-08-12 DIAGNOSIS — Z1211 Encounter for screening for malignant neoplasm of colon: Secondary | ICD-10-CM

## 2018-08-12 DIAGNOSIS — N183 Chronic kidney disease, stage 3 unspecified: Secondary | ICD-10-CM

## 2018-08-12 DIAGNOSIS — Z0000 Encounter for general adult medical examination without abnormal findings: Secondary | ICD-10-CM | POA: Diagnosis not present

## 2018-08-12 DIAGNOSIS — J320 Chronic maxillary sinusitis: Secondary | ICD-10-CM | POA: Diagnosis not present

## 2018-08-12 DIAGNOSIS — E538 Deficiency of other specified B group vitamins: Secondary | ICD-10-CM | POA: Diagnosis not present

## 2018-08-12 DIAGNOSIS — E782 Mixed hyperlipidemia: Secondary | ICD-10-CM

## 2018-08-12 DIAGNOSIS — Z1239 Encounter for other screening for malignant neoplasm of breast: Secondary | ICD-10-CM

## 2018-08-12 MED ORDER — NYSTATIN 100000 UNIT/GM EX POWD: Freq: Two times a day (BID) | CUTANEOUS | 0 refills | Status: AC

## 2018-08-12 MED ORDER — PREDNISONE 10 MG PO TABS: ORAL_TABLET | ORAL | 0 refills | Status: DC

## 2018-08-12 MED ORDER — CLARITHROMYCIN 250 MG PO TABS: 500.0000 mg | ORAL_TABLET | Freq: Two times a day (BID) | ORAL | 0 refills | Status: DC

## 2018-08-12 MED ORDER — CYANOCOBALAMIN 1000 MCG/ML IJ SOLN
1000.0000 ug | Freq: Once | INTRAMUSCULAR | Status: AC
  Administered 2018-08-12: 1000 ug via INTRAMUSCULAR

## 2018-08-12 NOTE — Progress Notes (Signed)
Patient ID: Jillian [name], female    DOB: Feb 08, 1951  Age: 68 y.o. MRN: [id number]  The patient is here for annual preventive  examination and management of other chronic and acute problems.   The risk factors are reflected in the social history.  The roster of all physicians providing medical care to patient - is listed in the Snapshot section of the chart.  Activities of daily living:  The patient is 100% independent in all ADLs: dressing, toileting, feeding as well as independent mobility  Home safety : The patient has smoke detectors in the home. They wear seatbelts.  There are no firearms at home. There is no violence in the home.   There is no risks for hepatitis, STDs or HIV. There is no   history of blood transfusion. They have no travel history to infectious disease endemic areas of the world.  The patient has seen their dentist in the last six month. They have seen their eye doctor in the last year. They admit to slight hearing difficulty with regard to whispered voices and some television programs.  They have deferred audiologic testing in the last year.  They do not  have excessive sun exposure. Discussed the need for sun protection: hats, long sleeves and use of sunscreen if there is significant sun exposure.   Diet: the importance of a healthy diet is discussed. They do have a healthy diet.  The benefits of regular aerobic exercise were discussed. She walks 4 times per week ,  20 minutes.   Depression screen: there are no signs or vegative symptoms of depression- irritability, change in appetite, anhedonia, sadness/tearfullness.  Cognitive assessment: the patient manages all their financial and personal affairs and is actively engaged. They could relate day,date,year and events; recalled 2/3 objects at 3 minutes; performed clock-face test normally.  The following portions of the patient's history were reviewed and updated as appropriate: allergies, current medications, past  family history, past medical history,  past surgical history, past social history  and problem list.  Visual acuity was not assessed per patient preference since she has regular follow up with her ophthalmologist. Hearing and body mass index were assessed and reviewed.   During the course of the visit the patient was educated and counseled about appropriate screening and preventive services including : fall prevention , diabetes screening, nutrition counseling, colorectal cancer screening, and recommended immunizations.    CC: The primary encounter diagnosis was Mixed hyperlipidemia. Diagnoses of CKD (chronic kidney disease) stage 3, GFR 30-59 ml/min (HCC), Breast cancer screening, B12 deficiency, Chronic right maxillary sinusitis, Essential hypertension, and Encounter for preventive health examination were also pertinent to this visit.   Cc: dizzy spells  For the past 3 months acconpanied by right sided sinus pain and ear pain. Was teated for sinusitis on May 1,  And notes that her all  symptoms resolved but returned one week after finishing amoxicillin  and prednisone taper . Now occurring with bending over,  Not while rolling in bed .  NOT VERTIGO.  TAKING FLONASE,  ZYRTEC irrigating sinuses.    Hypertension: patient checks blood pressure twice weekly at home.  Readings have been for the most part < 140/80 at rest . Patient is following a reduce salt diet most days and is taking medications as prescribed History Jillian [name] has a past medical history of Allergy, Arthritis, Cancer ([place]), GERD (gastroesophageal reflux disease), Hyperlipidemia, Hypertension, Irritable bowel syndrome (IBS) (Oct 2011), Neuropathy, and Vertigo.   She has a past surgical  history that includes Tubal ligation (1977); Tonsillectomy and adenoidectomy (1956); Vein Surgery; Cataract extraction w/PHACO (Left, 04/09/2017); and Cataract extraction w/PHACO (Right, 04/30/2017).   Her family history includes Cancer in her cousin;  Hyperlipidemia in her father, maternal grandfather, maternal grandmother, and mother; Hypertension in her father.She reports that she quit smoking about 19 years ago. Her smoking use included cigarettes. She has never used smokeless tobacco. She reports that she does not drink alcohol or use drugs.  Outpatient Medications Prior to Visit  Medication Sig Dispense Refill  . amLODipine (NORVASC) 2.5 MG tablet TAKE ONE TABLET BY MOUTH DAILY 30 tablet 0  . aspirin 81 MG tablet Take 81 mg by mouth daily.      . cetirizine (ZYRTEC) 10 MG tablet Take 10 mg by mouth at bedtime.    . Cholecalciferol (VITAMIN D3) 1000 UNITS CAPS Take 1,000 Units by mouth daily.     . famotidine (PEPCID) 10 MG tablet Take 10 mg by mouth daily.    . Fluocinolone Acetonide 0.01 % OIL Apply 1 application topically See admin instructions. Apply to affected ear daily as needed for dermatisis    . fluticasone (FLONASE) 50 MCG/ACT nasal spray PLACE 2 SPRAYS INTO THE NOSE DAILY. 16 g 4  . lactulose (CHRONULAC) 10 GM/15ML solution TAKE 30 MILLILITERS EVERY 4 HOURS UNTIL CONSTIPATION IS RELIEVED 473 mL 2  . NON FORMULARY Apply 1 application topically daily as needed (for dermatisis on face). Hydrocortisone 2.5%/Ketoconazole 2% Cream    . psyllium (METAMUCIL SMOOTH TEXTURE) 58.6 % powder Take 1 packet by mouth daily. 283 g 2  . simvastatin (ZOCOR) 40 MG tablet TAKE ONE TABLET BY MOUTH EVERY NIGHT AT BEDTIME 30 tablet 0  . enalapril (VASOTEC) 20 MG tablet TAKE ONE TABLET BY MOUTH TWICE A DAY 60 tablet 0  . amoxicillin (AMOXIL) 500 MG capsule Take 1 capsule (500 mg total) by mouth 2 (two) times daily. 20 capsule 0  . methylPREDNISolone (MEDROL DOSEPAK) 4 MG TBPK tablet Take according to pack instructions 21 tablet 0   No facility-administered medications prior to visit.     Review of Systems   Patient denies headache, fevers, malaise, unintentional weight loss, skin rash, eye pain, sinus congestion and sinus pain, sore throat,  dysphagia,  hemoptysis , cough, dyspnea, wheezing, chest pain, palpitations, orthopnea, edema, abdominal pain, nausea, melena, diarrhea, constipation, flank pain, dysuria, hematuria, urinary  Frequency, nocturia, numbness, tingling, seizures,  Focal weakness, Loss of consciousness,  Tremor, insomnia, depression, anxiety, and suicidal ideation.      Objective:  BP 122/78 (BP Location: Left Arm, Patient Position: Sitting, Cuff Size: Large)   Pulse 66   Temp 98.5 F (36.9 C) (Oral)   Resp 15   Ht 5\' 6"  (1.676 m)   Wt 204 lb 3.2 oz (92.6 kg)   SpO2 97%   BMI 32.96 kg/m   Physical Exam   General appearance: alert, cooperative and appears stated age Head: Normocephalic, without obvious abnormality, atraumatic Eyes: conjunctivae/corneas clear. PERRL, EOM's intact. Fundi benign. Ears: erythematous right TM,  Normal left TM  and external ear canals both ears Nose: Nares normal. Septum midline. Mucosa normal. Right maxillary sinus tenderness .  Marland Kitchen Throat: lips, mucosa, and tongue normal; teeth and gums normal Neck: no adenopathy, no carotid bruit, no JVD, supple, symmetrical, trachea midline and thyroid not enlarged, symmetric, no tenderness/mass/nodules Lungs: clear to auscultation bilaterally Breasts: normal appearance, no masses or tenderness Heart: regular rate and rhythm, S1, S2 normal, no murmur, click, rub or gallop  Abdomen: soft, non-tender; bowel sounds normal; no masses,  no organomegaly Extremities: extremities normal, atraumatic, no cyanosis or edema Pulses: 2+ and symmetric Skin: Skin color, texture, turgor normal. No rashes or lesions Neurologic: Alert and oriented X 3, normal strength and tone. Normal symmetric reflexes. Normal coordination and gait.      Assessment & Plan:   Problem List Items Addressed This Visit    B12 deficiency   Chronic right maxillary sinusitis    With otitis.  Symptoms returned one week after finishing amoxicillin. Clindamycin  X 2 weeks,   Prednisone taper,  Sudafed pE,  If no resolution,  ENT referral       Relevant Medications   clarithromycin (BIAXIN) 250 MG tablet   predniSONE (DELTASONE) 10 MG tablet   CKD (chronic kidney disease) stage 3, GFR 30-59 ml/min (HCC)    Stable.  All modificiable risk factors for progression have been addressed.  Lab Results  Component Value Date   CREATININE 1.34 (H) 08/12/2018   Lab Results  Component Value Date   MICROALBUR <0.7 08/12/2018    Lab Results  Component Value Date   NA 140 08/12/2018   K 4.9 08/12/2018   CL 104 08/12/2018   CO2 25 08/12/2018         Relevant Orders   Comprehensive metabolic panel (Completed)   Microalbumin / creatinine urine ratio (Completed)   Encounter for preventive health examination    age appropriate education and counseling updated, referrals for preventative services and immunizations addressed, dietary and smoking counseling addressed, most recent labs reviewed.  I have personally reviewed and have noted:  1) the patient's medical and social history 2) The pt's use of alcohol, tobacco, and illicit drugs 3) The patient's current medications and supplements 4) Functional ability including ADL's, fall risk, home safety risk, hearing and visual impairment 5) Diet and physical activities 6) Evidence for depression or mood disorder 7) The patient's height, weight, and BMI have been recorded in the chart  I have made referrals, and provided counseling and education based on review of the above      Hyperlipidemia - Primary    LDL is at goal on current medications . She has no side effects , and LFTs are normal . No changes today  Lab Results  Component Value Date   CHOL 158 08/12/2018   HDL 50.10 08/12/2018   LDLCALC 73 08/12/2018   LDLDIRECT 85.0 05/30/2015   TRIG 178.0 (H) 08/12/2018   CHOLHDL 3 08/12/2018    Lab Results  Component Value Date   ALT 14 08/12/2018   AST 18 08/12/2018   ALKPHOS 65 08/12/2018   BILITOT 0.4  08/12/2018           Relevant Medications   telmisartan (MICARDIS) 80 MG tablet   Other Relevant Orders   Lipid panel (Completed)   Hypertension    Well controlled on current regimen. I am making a decision to change patient's ACE Inhibitor to an ARB  based on increased reports of  angioedema.  I also advised patient to to take it at night instead of morning,  as recent studies have shown a reduction in incidence of heart attacks and strokes.       Relevant Medications   telmisartan (MICARDIS) 80 MG tablet    Other Visit Diagnoses    Breast cancer screening       Relevant Orders   MM 3D SCREEN BREAST BILATERAL      I have discontinued Belenda Cruise A. Alcott's  methylPREDNISolone, amoxicillin, and enalapril. I am also having her start on clarithromycin, predniSONE, nystatin, and telmisartan. Additionally, I am having her maintain her Vitamin D3, aspirin, fluticasone, famotidine, cetirizine, Fluocinolone Acetonide, NON FORMULARY, lactulose, psyllium, amLODipine, and simvastatin. We administered cyanocobalamin.  Meds ordered this encounter  Medications  . clarithromycin (BIAXIN) 250 MG tablet    Sig: Take 2 tablets (500 mg total) by mouth 2 (two) times daily.    Dispense:  56 tablet    Refill:  0  . predniSONE (DELTASONE) 10 MG tablet    Sig: 6 tablets daily for 3 day , then reduce by 1 tablet daily until gone    Dispense:  33 tablet    Refill:  0  . nystatin (MYCOSTATIN/NYSTOP) powder    Sig: Apply topically 2 (two) times daily. To rash until resolved.    Dispense:  15 g    Refill:  0  . cyanocobalamin ((VITAMIN B-12)) injection 1,000 mcg  . telmisartan (MICARDIS) 80 MG tablet    Sig: Take 1 tablet (80 mg total) by mouth every evening.    Dispense:  90 tablet    Refill:  0    Medications Discontinued During This Encounter  Medication Reason  . methylPREDNISolone (MEDROL DOSEPAK) 4 MG TBPK tablet Error  . amoxicillin (AMOXIL) 500 MG capsule Error  . enalapril (VASOTEC)  20 MG tablet     Follow-up: No follow-ups on file.   Crecencio Mc, MD

## 2018-08-12 NOTE — Patient Instructions (Addendum)
Suspend the simvastatin while you are taking the antibiotic  Clindamycin 500 mg (2 pills) every 12 hours for 2 weeks  Prednisone 60 mg daily for 3 days ,  Then start the daily taper   If no improvement in your dizziness,  Ear and sinus pain  An ENT referral needed   Your annual mammogram has been ordered.  You are encouraged (required) to call to make your appointment at [facility]   I will initiate the order for your colon  cancer screening  Test.  It is called  Cologuard.  It will be delivered to your house, and you will send off a stool sample in the envelope it provides.     Health Maintenance After Age 28 After age 78, you are at a higher risk for certain long-term diseases and infections as well as injuries from falls. Falls are a major cause of broken bones and head injuries in people who are older than age 9. Getting regular preventive care can help to keep you healthy and well. Preventive care includes getting regular testing and making lifestyle changes as recommended by your health care provider. Talk with your health care provider about:  Which screenings and tests you should have. A screening is a test that checks for a disease when you have no symptoms.  A diet and exercise plan that is right for you. What should I know about screenings and tests to prevent falls? Screening and testing are the best ways to find a health problem early. Early diagnosis and treatment give you the best chance of managing medical conditions that are common after age 60. Certain conditions and lifestyle choices may make you more likely to have a fall. Your health care provider may recommend:  Regular vision checks. Poor vision and conditions such as cataracts can make you more likely to have a fall. If you wear glasses, make sure to get your prescription updated if your vision changes.  Medicine review. Work with your health care provider to regularly review all of the medicines you are  taking, including over-the-counter medicines. Ask your health care provider about any side effects that may make you more likely to have a fall. Tell your health care provider if any medicines that you take make you feel dizzy or sleepy.  Osteoporosis screening. Osteoporosis is a condition that causes the bones to get weaker. This can make the bones weak and cause them to break more easily.  Blood pressure screening. Blood pressure changes and medicines to control blood pressure can make you feel dizzy.  Strength and balance checks. Your health care provider may recommend certain tests to check your strength and balance while standing, walking, or changing positions.  Foot health exam. Foot pain and numbness, as well as not wearing proper footwear, can make you more likely to have a fall.  Depression screening. You may be more likely to have a fall if you have a fear of falling, feel emotionally low, or feel unable to do activities that you used to do.  Alcohol use screening. Using too much alcohol can affect your balance and may make you more likely to have a fall. What actions can I take to lower my risk of falls? General instructions  Talk with your health care provider about your risks for falling. Tell your health care provider if: ? You fall. Be sure to tell your health care provider about all falls, even ones that seem minor. ? You feel dizzy, sleepy,  or off-balance.  Take over-the-counter and prescription medicines only as told by your health care provider. These include any supplements.  Eat a healthy diet and maintain a healthy weight. A healthy diet includes low-fat dairy products, low-fat (lean) meats, and fiber from whole grains, beans, and lots of fruits and vegetables. Home safety  Remove any tripping hazards, such as rugs, cords, and clutter.  Install safety equipment such as grab bars in bathrooms and safety rails on stairs.  Keep rooms and walkways  well-lit. Activity   Follow a regular exercise program to stay fit. This will help you maintain your balance. Ask your health care provider what types of exercise are appropriate for you.  If you need a cane or walker, use it as recommended by your health care provider.  Wear supportive shoes that have nonskid soles. Lifestyle  Do not drink alcohol if your health care provider tells you not to drink.  If you drink alcohol, limit how much you have: ? 0-1 drink a day for women. ? 0-2 drinks a day for men.  Be aware of how much alcohol is in your drink. In the [place] one drink equals one typical bottle of beer (12 oz), one-half glass of wine (5 oz), or one shot of hard liquor (1 oz).  Do not use any products that contain nicotine or tobacco, such as cigarettes and e-cigarettes. If you need help quitting, ask your health care provider. Summary  Having a healthy lifestyle and getting preventive care can help to protect your health and wellness after age 44.  Screening and testing are the best way to find a health problem early and help you avoid having a fall. Early diagnosis and treatment give you the best chance for managing medical conditions that are more common for people who are older than age 15.  Falls are a major cause of broken bones and head injuries in people who are older than age 45. Take precautions to prevent a fall at home.  Work with your health care provider to learn what changes you can make to improve your health and wellness and to prevent falls. This information is not intended to replace advice given to you by your health care provider. Make sure you discuss any questions you have with your health care provider. Document Released: 12/26/2016 Document Revised: 12/26/2016 Document Reviewed: 12/26/2016 Elsevier Interactive Patient Education  2019 [employer].

## 2018-08-13 DIAGNOSIS — J320 Chronic maxillary sinusitis: Secondary | ICD-10-CM | POA: Insufficient documentation

## 2018-08-13 HISTORY — DX: Chronic maxillary sinusitis: J32.0

## 2018-08-13 LAB — COMPREHENSIVE METABOLIC PANEL
ALT: 14 U/L (ref 0–35)
AST: 18 U/L (ref 0–37)
Albumin: 4.7 g/dL (ref 3.5–5.2)
Alkaline Phosphatase: 65 U/L (ref 39–117)
BUN: 28 mg/dL — ABNORMAL HIGH (ref 6–23)
CO2: 25 mEq/L (ref 19–32)
Calcium: 10.1 mg/dL (ref 8.4–10.5)
Chloride: 104 mEq/L (ref 96–112)
Creatinine, Ser: 1.34 mg/dL — ABNORMAL HIGH (ref 0.40–1.20)
GFR: 39.21 mL/min — ABNORMAL LOW (ref >60.00)
Glucose, Bld: 83 mg/dL (ref 70–99)
Potassium: 4.9 mEq/L (ref 3.5–5.1)
Sodium: 140 mEq/L (ref 135–145)
Total Bilirubin: 0.4 mg/dL (ref 0.2–1.2)
Total Protein: 7 g/dL (ref 6.0–8.3)

## 2018-08-13 LAB — LIPID PANEL
Cholesterol: 158 mg/dL (ref 0–200)
HDL: 50.1 mg/dL (ref >39.00)
LDL Cholesterol: 73 mg/dL (ref 0–99)
NonHDL: 108.29
Total CHOL/HDL Ratio: 3
Triglycerides: 178 mg/dL — ABNORMAL HIGH (ref 0.0–149.0)
VLDL: 35.6 mg/dL (ref 0.0–40.0)

## 2018-08-13 LAB — MICROALBUMIN / CREATININE URINE RATIO
Creatinine,U: 85.8 mg/dL
Microalb Creat Ratio: 0.8 mg/g (ref 0.0–30.0)
Microalb, Ur: <0.7 mg/dL (ref 0.0–1.9)

## 2018-08-13 MED ORDER — TELMISARTAN 80 MG PO TABS: 80.0000 mg | ORAL_TABLET | Freq: Every evening | ORAL | 0 refills | Status: DC

## 2018-08-13 NOTE — Assessment & Plan Note (Signed)

## 2018-08-13 NOTE — Assessment & Plan Note (Signed)
LDL is at goal on current medications . She has no side effects , and LFTs are normal . No changes today  Lab Results  Component Value Date   CHOL 158 08/12/2018   HDL 50.10 08/12/2018   LDLCALC 73 08/12/2018   LDLDIRECT 85.0 05/30/2015   TRIG 178.0 (H) 08/12/2018   CHOLHDL 3 08/12/2018    Lab Results  Component Value Date   ALT 14 08/12/2018   AST 18 08/12/2018   ALKPHOS 65 08/12/2018   BILITOT 0.4 08/12/2018

## 2018-08-13 NOTE — Assessment & Plan Note (Signed)
With otitis.  Symptoms returned one week after finishing amoxicillin. Clindamycin  X 2 weeks,  Prednisone taper,  Sudafed pE,  If no resolution,  ENT referral

## 2018-08-13 NOTE — Assessment & Plan Note (Signed)
Well controlled on current regimen. I am making a decision to change patient's ACE Inhibitor to an ARB  based on increased reports of  angioedema.  I also advised patient to to take it at night instead of morning,  as recent studies have shown a reduction in incidence of heart attacks and strokes.  

## 2018-08-13 NOTE — Assessment & Plan Note (Signed)
Stable.  All modificiable risk factors for progression have been addressed.  Lab Results  Component Value Date   CREATININE 1.34 (H) 08/12/2018   Lab Results  Component Value Date   MICROALBUR <0.7 08/12/2018    Lab Results  Component Value Date   NA 140 08/12/2018   K 4.9 08/12/2018   CL 104 08/12/2018   CO2 25 08/12/2018

## 2018-08-14 ENCOUNTER — Other Ambulatory Visit: Payer: Self-pay | Admitting: Internal Medicine

## 2018-08-14 DIAGNOSIS — R944 Abnormal results of kidney function studies: Secondary | ICD-10-CM

## 2018-08-17 ENCOUNTER — Other Ambulatory Visit: Payer: Self-pay | Admitting: Internal Medicine

## 2018-08-19 ENCOUNTER — Telehealth: Payer: Self-pay

## 2018-08-19 ENCOUNTER — Encounter: Payer: Self-pay | Admitting: Internal Medicine

## 2018-08-19 NOTE — Telephone Encounter (Signed)
-----   Message from Crecencio Mc, MD sent at 08/16/2018  9:23 AM EDT ----- Regarding: lab appt needed Please schedule her a lab appt before or after her RN appointment on July 21st.  BMET needed   Regards,   Deborra Medina, MD

## 2018-08-19 NOTE — Telephone Encounter (Signed)
Pt is scheduled for non fasting lab work on 09/16/2018. Pt is aware of appt date and time.

## 2018-08-23 ENCOUNTER — Other Ambulatory Visit: Payer: Self-pay | Admitting: Family Medicine

## 2018-08-23 DIAGNOSIS — Z20822 Contact with and (suspected) exposure to covid-19: Secondary | ICD-10-CM

## 2018-08-23 DIAGNOSIS — R6889 Other general symptoms and signs: Secondary | ICD-10-CM | POA: Diagnosis not present

## 2018-08-28 DIAGNOSIS — H8103 Meniere's disease, bilateral: Secondary | ICD-10-CM

## 2018-08-29 LAB — NOVEL CORONAVIRUS, NAA: SARS-CoV-2, NAA: NOT DETECTED

## 2018-09-11 DIAGNOSIS — R42 Dizziness and giddiness: Secondary | ICD-10-CM | POA: Diagnosis not present

## 2018-09-11 DIAGNOSIS — J301 Allergic rhinitis due to pollen: Secondary | ICD-10-CM | POA: Diagnosis not present

## 2018-09-11 DIAGNOSIS — H903 Sensorineural hearing loss, bilateral: Secondary | ICD-10-CM | POA: Diagnosis not present

## 2018-09-11 DIAGNOSIS — H6980 Other specified disorders of Eustachian tube, unspecified ear: Secondary | ICD-10-CM | POA: Diagnosis not present

## 2018-09-16 ENCOUNTER — Ambulatory Visit (INDEPENDENT_AMBULATORY_CARE_PROVIDER_SITE_OTHER): Payer: Medicare Other

## 2018-09-16 ENCOUNTER — Other Ambulatory Visit (INDEPENDENT_AMBULATORY_CARE_PROVIDER_SITE_OTHER): Payer: Medicare Other

## 2018-09-16 ENCOUNTER — Other Ambulatory Visit: Payer: Self-pay

## 2018-09-16 DIAGNOSIS — R944 Abnormal results of kidney function studies: Secondary | ICD-10-CM | POA: Diagnosis not present

## 2018-09-16 DIAGNOSIS — E538 Deficiency of other specified B group vitamins: Secondary | ICD-10-CM

## 2018-09-16 LAB — BASIC METABOLIC PANEL
BUN: 21 mg/dL (ref 6–23)
CO2: 24 mEq/L (ref 19–32)
Calcium: 9.5 mg/dL (ref 8.4–10.5)
Chloride: 105 mEq/L (ref 96–112)
Creatinine, Ser: 1.16 mg/dL (ref 0.40–1.20)
GFR: 46.3 mL/min — ABNORMAL LOW (ref >60.00)
Glucose, Bld: 87 mg/dL (ref 70–99)
Potassium: 4.3 mEq/L (ref 3.5–5.1)
Sodium: 139 mEq/L (ref 135–145)

## 2018-09-16 MED ORDER — CYANOCOBALAMIN 1000 MCG/ML IJ SOLN
1000.0000 ug | Freq: Once | INTRAMUSCULAR | Status: AC
  Administered 2018-09-16: 1000 ug via INTRAMUSCULAR

## 2018-09-16 NOTE — Progress Notes (Addendum)
Patient presented today for B12 injection.  Administered IM in right deltoid.  Patient tolerated well with no signs of distress.  Reviewed.  Dr Scott    

## 2018-09-22 ENCOUNTER — Other Ambulatory Visit: Payer: Self-pay | Admitting: Internal Medicine

## 2018-09-23 ENCOUNTER — Other Ambulatory Visit: Payer: Self-pay | Admitting: Nephrology

## 2018-09-23 DIAGNOSIS — N183 Chronic kidney disease, stage 3 unspecified: Secondary | ICD-10-CM

## 2018-09-23 DIAGNOSIS — N281 Cyst of kidney, acquired: Secondary | ICD-10-CM | POA: Diagnosis not present

## 2018-09-23 DIAGNOSIS — I129 Hypertensive chronic kidney disease with stage 1 through stage 4 chronic kidney disease, or unspecified chronic kidney disease: Secondary | ICD-10-CM | POA: Diagnosis not present

## 2018-09-24 ENCOUNTER — Other Ambulatory Visit: Payer: Self-pay | Admitting: Nephrology

## 2018-09-24 DIAGNOSIS — N281 Cyst of kidney, acquired: Secondary | ICD-10-CM

## 2018-09-24 DIAGNOSIS — N183 Chronic kidney disease, stage 3 unspecified: Secondary | ICD-10-CM

## 2018-10-06 ENCOUNTER — Ambulatory Visit
Admission: RE | Admit: 2018-10-06 | Discharge: 2018-10-06 | Disposition: A | Discharge status: Home / Self Care | Payer: Medicare Other | Source: Ambulatory Visit | Attending: Nephrology | Admitting: Nephrology

## 2018-10-06 ENCOUNTER — Other Ambulatory Visit: Payer: Self-pay

## 2018-10-06 DIAGNOSIS — N183 Chronic kidney disease, stage 3 unspecified: Secondary | ICD-10-CM

## 2018-10-06 DIAGNOSIS — N281 Cyst of kidney, acquired: Secondary | ICD-10-CM | POA: Insufficient documentation

## 2018-10-15 DIAGNOSIS — L570 Actinic keratosis: Secondary | ICD-10-CM | POA: Diagnosis not present

## 2018-10-15 DIAGNOSIS — Z85828 Personal history of other malignant neoplasm of skin: Secondary | ICD-10-CM | POA: Diagnosis not present

## 2018-10-15 DIAGNOSIS — L578 Other skin changes due to chronic exposure to nonionizing radiation: Secondary | ICD-10-CM | POA: Diagnosis not present

## 2018-10-18 ENCOUNTER — Other Ambulatory Visit: Payer: Self-pay | Admitting: Internal Medicine

## 2018-10-21 ENCOUNTER — Ambulatory Visit (INDEPENDENT_AMBULATORY_CARE_PROVIDER_SITE_OTHER): Payer: Medicare Other

## 2018-10-21 ENCOUNTER — Other Ambulatory Visit: Payer: Self-pay

## 2018-10-21 DIAGNOSIS — E538 Deficiency of other specified B group vitamins: Secondary | ICD-10-CM

## 2018-10-21 MED ORDER — CYANOCOBALAMIN 1000 MCG/ML IJ SOLN
1000.0000 ug | Freq: Once | INTRAMUSCULAR | Status: AC
  Administered 2018-10-21: 1000 ug via INTRAMUSCULAR

## 2018-10-21 NOTE — Progress Notes (Signed)
Patient presented today for B12 injection.  Administered IM in left deltoid.  Patient tolerated well with no signs of distress.   

## 2018-11-15 ENCOUNTER — Telehealth: Payer: Self-pay | Admitting: Internal Medicine

## 2018-11-15 NOTE — Addendum Note (Signed)
Addended by: Crecencio Mc on: 11/15/2018 06:44 PM   Modules accepted: Orders

## 2018-11-15 NOTE — Telephone Encounter (Signed)
Letter written and sent via mychart cologuard ordered.

## 2018-11-16 ENCOUNTER — Other Ambulatory Visit: Payer: Self-pay | Admitting: Internal Medicine

## 2018-11-18 ENCOUNTER — Other Ambulatory Visit: Payer: Self-pay | Admitting: Internal Medicine

## 2018-11-24 ENCOUNTER — Other Ambulatory Visit: Payer: Self-pay

## 2018-11-24 ENCOUNTER — Ambulatory Visit (INDEPENDENT_AMBULATORY_CARE_PROVIDER_SITE_OTHER): Payer: Medicare Other

## 2018-11-24 DIAGNOSIS — Z23 Encounter for immunization: Secondary | ICD-10-CM

## 2018-11-24 DIAGNOSIS — E538 Deficiency of other specified B group vitamins: Secondary | ICD-10-CM

## 2018-11-24 MED ORDER — CYANOCOBALAMIN 1000 MCG/ML IJ SOLN
1000.0000 ug | Freq: Once | INTRAMUSCULAR | Status: AC
  Administered 2018-11-24: 1000 ug via INTRAMUSCULAR

## 2018-11-24 NOTE — Progress Notes (Signed)
Patient presented for B12 injection today.  Administered IM in right deltoid.  Patient tolerated well with no signs of distress.

## 2018-12-01 DIAGNOSIS — H43813 Vitreous degeneration, bilateral: Secondary | ICD-10-CM | POA: Diagnosis not present

## 2018-12-17 ENCOUNTER — Other Ambulatory Visit: Payer: Self-pay | Admitting: Internal Medicine

## 2018-12-24 ENCOUNTER — Other Ambulatory Visit: Payer: Self-pay

## 2018-12-25 ENCOUNTER — Other Ambulatory Visit: Payer: Self-pay

## 2018-12-25 ENCOUNTER — Ambulatory Visit (INDEPENDENT_AMBULATORY_CARE_PROVIDER_SITE_OTHER): Payer: Medicare Other

## 2018-12-25 DIAGNOSIS — E538 Deficiency of other specified B group vitamins: Secondary | ICD-10-CM

## 2018-12-25 MED ORDER — CYANOCOBALAMIN 1000 MCG/ML IJ SOLN
1000.0000 ug | Freq: Once | INTRAMUSCULAR | Status: AC
  Administered 2018-12-25: 1000 ug via INTRAMUSCULAR

## 2018-12-25 NOTE — Progress Notes (Signed)
Patient presented today for B12 injection.  Administered IM in the left deltoid.  Patient tolerated well with no signs of distress.    (Used left deltoid since pt said she recently had her 2nd dose of shingles vaccine last weekend at the pharmacy in her right arm).

## 2019-01-26 ENCOUNTER — Other Ambulatory Visit: Payer: Self-pay

## 2019-01-26 ENCOUNTER — Ambulatory Visit (INDEPENDENT_AMBULATORY_CARE_PROVIDER_SITE_OTHER): Payer: Medicare Other | Admitting: *Deleted

## 2019-01-26 DIAGNOSIS — E538 Deficiency of other specified B group vitamins: Secondary | ICD-10-CM

## 2019-01-26 MED ORDER — CYANOCOBALAMIN 1000 MCG/ML IJ SOLN
1000.0000 ug | Freq: Once | INTRAMUSCULAR | Status: AC
  Administered 2019-01-26: 1000 ug via INTRAMUSCULAR

## 2019-01-26 NOTE — Progress Notes (Signed)
Patient presented for B 12 injection to right deltoid, patient voiced no concerns nor showed any signs of distress during injection. 

## 2019-02-13 ENCOUNTER — Ambulatory Visit: Payer: Medicare Other | Admitting: Internal Medicine

## 2019-02-25 ENCOUNTER — Other Ambulatory Visit: Payer: Self-pay

## 2019-02-25 ENCOUNTER — Ambulatory Visit (INDEPENDENT_AMBULATORY_CARE_PROVIDER_SITE_OTHER): Payer: Medicare Other

## 2019-02-25 ENCOUNTER — Encounter: Payer: Self-pay | Admitting: Internal Medicine

## 2019-02-25 ENCOUNTER — Ambulatory Visit
Admission: RE | Admit: 2019-02-25 | Discharge: 2019-02-25 | Disposition: A | Discharge status: Home / Self Care | Payer: Medicare Other | Source: Ambulatory Visit | Attending: Internal Medicine | Admitting: Internal Medicine

## 2019-02-25 ENCOUNTER — Ambulatory Visit (INDEPENDENT_AMBULATORY_CARE_PROVIDER_SITE_OTHER): Payer: Medicare Other | Admitting: Internal Medicine

## 2019-02-25 ENCOUNTER — Other Ambulatory Visit: Payer: Self-pay | Admitting: Internal Medicine

## 2019-02-25 VITALS — Ht 66.0 in | Wt 204.0 lb

## 2019-02-25 VITALS — BP 124/80 | Ht 66.0 in | Wt 204.0 lb

## 2019-02-25 DIAGNOSIS — I10 Essential (primary) hypertension: Secondary | ICD-10-CM

## 2019-02-25 DIAGNOSIS — N1832 Chronic kidney disease, stage 3b: Secondary | ICD-10-CM

## 2019-02-25 DIAGNOSIS — Z1231 Encounter for screening mammogram for malignant neoplasm of breast: Secondary | ICD-10-CM | POA: Insufficient documentation

## 2019-02-25 DIAGNOSIS — Z0000 Encounter for general adult medical examination without abnormal findings: Secondary | ICD-10-CM

## 2019-02-25 DIAGNOSIS — R928 Other abnormal and inconclusive findings on diagnostic imaging of breast: Secondary | ICD-10-CM

## 2019-02-25 DIAGNOSIS — N6310 Unspecified lump in the right breast, unspecified quadrant: Secondary | ICD-10-CM

## 2019-02-25 DIAGNOSIS — K760 Fatty (change of) liver, not elsewhere classified: Secondary | ICD-10-CM

## 2019-02-25 DIAGNOSIS — N898 Other specified noninflammatory disorders of vagina: Secondary | ICD-10-CM

## 2019-02-25 DIAGNOSIS — E782 Mixed hyperlipidemia: Secondary | ICD-10-CM | POA: Diagnosis not present

## 2019-02-25 DIAGNOSIS — Z1239 Encounter for other screening for malignant neoplasm of breast: Secondary | ICD-10-CM | POA: Diagnosis present

## 2019-02-25 DIAGNOSIS — I700 Atherosclerosis of aorta: Secondary | ICD-10-CM

## 2019-02-25 NOTE — Patient Instructions (Addendum)
  Ms. Pridgeon , Thank you for taking time to come for your Medicare Wellness Visit. I appreciate your ongoing commitment to your health goals. Please review the following plan we discussed and let me know if I can assist you in the future.   These are the goals we discussed: Goals    . Increase physical activity     Walking for exercise       This is a list of the screening recommended for you and due dates:  Health Maintenance  Topic Date Due  . Cologuard (Stool DNA test)  08/01/1999  . Mammogram  02/11/2019  . Flu Shot  Completed  . DEXA scan (bone density measurement)  Completed  .  Hepatitis C: One time screening is recommended by Center for Disease Control  (CDC) for  adults born from [birth year] through 1966.   Completed  . Pneumonia vaccines  Completed

## 2019-02-25 NOTE — Assessment & Plan Note (Signed)
Persistent,  Improved with prednison prescribed for other reasons.   Infectious workup in 2017 was negative.  Gyn referral in process

## 2019-02-25 NOTE — Progress Notes (Addendum)
Subjective:   Jillian [name] is a 68 y.o. female who presents for Medicare Annual (Subsequent) preventive examination.  Review of Systems:  No ROS.  Medicare Wellness Virtual Visit.  Visual/audio telehealth visit, UTA vital signs.   Wt/Ht provided by patient.  See social history for additional risk factors.   Cardiac Risk Factors include: advanced age (>19men, >15 women);hypertension     Objective:     Vitals: Ht 5\' 6"  (1.676 m)   Wt 204 lb (92.5 kg)   BMI 32.93 kg/m   Body mass index is 32.93 kg/m.  Advanced Directives 02/25/2019 02/21/2018 04/09/2017 02/14/2017 02/07/2016  Does Patient Have a Medical Advance Directive? Yes Yes Yes Yes Yes  Type of Advance Directive Healthcare Power of [place] will  Does patient want to make changes to medical advance directive? No - Patient declined No - Patient declined No - Patient declined No - Patient declined No - Patient declined  Copy of [place] in Chart? No - copy requested No - copy requested No - copy requested No - copy requested -    Tobacco Social History   Tobacco Use  Smoking Status Former Smoker  . Types: Cigarettes  . Quit date: 03/16/1999  . Years since quitting: 19.9  Smokeless Tobacco Never Used     Counseling given: Not Answered   Clinical Intake:  Pre-visit preparation completed: Yes        Diabetes: No  How often do you need to have someone help you when you read instructions, pamphlets, or other written materials from your doctor or pharmacy?: 1 - Never  Interpreter Needed?: No     Past Medical History:  Diagnosis Date  . Allergy    seasonal rhinitis  . Arthritis    right shoulder,  no intervention  . Cancer ([place])    basal cell   . GERD (gastroesophageal reflux disease)   . Hyperlipidemia   . Hypertension   . Irritable bowel  syndrome (IBS) Oct 2011   last colonoscopy showed diverticulum, no polyps  . Neuropathy    FEET  . Vertigo    OCCAS   Past Surgical History:  Procedure Laterality Date  . CATARACT EXTRACTION W/PHACO Left 04/09/2017   Procedure: CATARACT EXTRACTION PHACO AND INTRAOCULAR LENS PLACEMENT (IOC);  Surgeon: Birder Robson, MD;  Location: [facility] ORS;  Service: Ophthalmology;  Laterality: Left;  [place] 00:29.6 AP% 18.4 CDE 5.44 FLUID PACK LOT # [id number]  . CATARACT EXTRACTION W/PHACO Right 04/30/2017   Procedure: CATARACT EXTRACTION PHACO AND INTRAOCULAR LENS PLACEMENT (IOC);  Surgeon: Birder Robson, MD;  Location: [facility] ORS;  Service: Ophthalmology;  Laterality: Right;  [place] 00:43.1 AP% 13.7 CDE 5.91 Fluid Pack Lot # [id number]  . TONSILLECTOMY AND ADENOIDECTOMY  1956  . TUBAL LIGATION  1977  . VEIN SURGERY     Family History  Problem Relation Age of Onset  . Hyperlipidemia Mother   . Hyperlipidemia Father   . Hypertension Father   . Hyperlipidemia Maternal Grandmother   . Hyperlipidemia Maternal Grandfather   . Cancer Cousin   . Breast cancer Neg Hx    Social History   Socioeconomic History  . Marital status: Divorced    Spouse name: Not on file  . Number of children: Not on file  . Years of education: Not on file  . Highest education level: Not on file  Occupational History  . Occupation: cutsodial  Employer: [employer]    Comment: works at [employer]  . Smoking status: Former Smoker    Types: Cigarettes    Quit date: 03/16/1999    Years since quitting: 19.9  . Smokeless tobacco: Never Used  Substance and Sexual Activity  . Alcohol use: No    Alcohol/week: 0.0 standard drinks  . Drug use: No  . Sexual activity: Not Currently  Other Topics Concern  . Not on file  Social History Narrative  . Not on file   Social Determinants of Health   Financial Resource Strain: Low Risk   . Difficulty of Paying Living Expenses: Not hard at all  Food Insecurity: No Food Insecurity   . Worried About Charity fundraiser in the Last Year: Never true  . Ran Out of Food in the Last Year: Never true  Transportation Needs: No Transportation Needs  . Lack of Transportation (Medical): No  . Lack of Transportation (Non-Medical): No  Physical Activity: Insufficiently Active  . Days of Exercise per Week: 1 day  . Minutes of Exercise per Session: 10 min  Stress: No Stress Concern Present  . Feeling of Stress : Only a little  Social Connections: Unknown  . Frequency of Communication with Friends and Family: Three times a week  . Frequency of Social Gatherings with Friends and Family: Not on file  . Attends Religious Services: Not on file  . Active Member of Clubs or Organizations: Not on file  . Attends Archivist Meetings: Not on file  . Marital Status: Not on file    Outpatient Encounter Medications as of 02/25/2019  Medication Sig  . amLODipine (NORVASC) 2.5 MG tablet TAKE ONE TABLET BY MOUTH DAILY  . aspirin 81 MG tablet Take 81 mg by mouth daily.    . cetirizine (ZYRTEC) 10 MG tablet Take 10 mg by mouth at bedtime.  . Cholecalciferol (VITAMIN D3) 1000 UNITS CAPS Take 1,000 Units by mouth daily.   . famotidine (PEPCID) 10 MG tablet Take 10 mg by mouth daily.  . Fluocinolone Acetonide 0.01 % OIL Apply 1 application topically See admin instructions. Apply to affected ear daily as needed for dermatisis  . fluticasone (FLONASE) 50 MCG/ACT nasal spray PLACE 2 SPRAYS INTO THE NOSE DAILY.  Marland Kitchen lactulose (CHRONULAC) 10 GM/15ML solution TAKE 30 MILLILITERS EVERY 4 HOURS UNTIL CONSTIPATION IS RELIEVED  . NON FORMULARY Apply 1 application topically daily as needed (for dermatisis on face). Hydrocortisone 2.5%/Ketoconazole 2% Cream  . nystatin (MYCOSTATIN/NYSTOP) powder Apply topically 2 (two) times daily. To rash until resolved.  . psyllium (METAMUCIL SMOOTH TEXTURE) 58.6 % powder Take 1 packet by mouth daily.  . simvastatin (ZOCOR) 40 MG tablet TAKE ONE TABLET BY MOUTH  EVERY NIGHT AT BEDTIME  . telmisartan (MICARDIS) 80 MG tablet TAKE ONE TABLET BY MOUTH EVERY EVENING  . [DISCONTINUED] clarithromycin (BIAXIN) 250 MG tablet Take 2 tablets (500 mg total) by mouth 2 (two) times daily.  . [DISCONTINUED] predniSONE (DELTASONE) 10 MG tablet 6 tablets daily for 3 day , then reduce by 1 tablet daily until gone   No facility-administered encounter medications on file as of 02/25/2019.    Activities of Daily Living In your present state of health, do you have any difficulty performing the following activities: 02/25/2019  Hearing? N  Vision? N  Difficulty concentrating or making decisions? N  Walking or climbing stairs? N  Dressing or bathing? N  Doing errands, shopping? N  Preparing Food and eating ? N  Using the Toilet? N  In the past six months, have you accidently leaked urine? N  Do you have problems with loss of bowel control? N  Managing your Medications? N  Managing your Finances? N  Housekeeping or managing your Housekeeping? N  Some recent data might be hidden    Patient Care Team: Crecencio Mc, MD as PCP - General (Internal Medicine)    Assessment:   This is a routine wellness examination for [facility].  Nurse connected with patient 02/25/19 at  9:00 AM EST by a telephone enabled telemedicine application and verified that I am speaking with the correct person using two identifiers. Patient stated full name and DOB. Patient gave permission to continue with virtual visit. Patient's location was at home and Nurse's location was at [place] office.   Patient is alert and oriented x3. Patient denies difficulty focusing or concentrating. Patient likes play computer games brain stimulation.   Health Maintenance Due: -Colonoscopy/cologuard- discussed.  -Mammogram- scheduled 02/25/19. See completed HM at the end of note.   Eye: Visual acuity not assessed. Virtual visit. Followed by their ophthalmologist. Retinopathy- none  reported.  Dental: Visits every 6 months.    Hearing: Demonstrates normal hearing during visit.  Safety:  Patient feels safe at home- yes Patient does have smoke detectors at home- yes Patient does wear sunscreen or protective clothing when in direct sunlight - yes Patient does wear seat belt when in a moving vehicle - yes Patient drives- yes Adequate lighting in walkways free from debris- yes Grab bars and handrails used as appropriate- yes Ambulates with an assistive device- no Cell phone on person when ambulating outside of the home- yes  Social: Alcohol intake - no      Smoking history- former   Smokers in home? none Illicit drug use? none  Medication: Taking as directed and without issues.  Self managed - yes   Covid-19: Precautions and sickness symptoms discussed. Wears mask, social distancing, hand hygiene as appropriate.   Activities of Daily Living Patient denies needing assistance with: household chores, feeding themselves, getting from bed to chair, getting to the toilet, bathing/showering, dressing, managing money, or preparing meals.   Discussed the importance of a healthy diet, water intake and the benefits of aerobic exercise.    Physical activity- active around the home, no routine.  Diet:  Regular Water: good intake Caffeine: coffee once in awhile  Other Providers Patient Care Team: Crecencio Mc, MD as PCP - General (Internal Medicine)  Exercise Activities and Dietary recommendations Current Exercise Habits: Home exercise routine, Type of exercise: walking, Intensity: Mild  Goals    . Increase physical activity     Walking for exercise       Fall Risk Fall Risk  02/25/2019 02/21/2018 02/14/2017 06/07/2016 02/07/2016  Falls in the past year? 0 0 No No No  Follow up Falls prevention discussed - - - -   Timed Get Up and Go performed: no, virtual visit.  Depression Screen PHQ 2/9 Scores 02/25/2019 02/21/2018 02/14/2017 06/07/2016  PHQ -  2 Score 0 0 0 0  PHQ- 9 Score - - 0 -     Cognitive Function MMSE - Mini Mental State Exam 02/14/2017 02/07/2016  Orientation to time 5 5  Orientation to Place 5 5  Registration 3 3  Attention/ Calculation 5 5  Recall 3 3  Language- name 2 objects 2 2  Language- repeat 1 1  Language- follow 3 step command 3 3  Language- read &  follow direction 1 1  Write a sentence 1 1  Copy design 1 1  Total score 30 30     6CIT Screen 02/25/2019 02/21/2018  What Year? 0 points 0 points  What month? 0 points 0 points  What time? 0 points 0 points  Count back from 20 0 points 0 points  Months in reverse 0 points 0 points  Repeat phrase 0 points 0 points  Total Score 0 0    Immunization History  Administered Date(s) Administered  . Fluad Quad(high Dose 65+) 11/24/2018  . Hep A / Hep B 01/09/2017, 02/14/2017, 07/09/2017  . Influenza, High Dose Seasonal PF 11/29/2014, 12/27/2015, 11/20/2016  . Influenza,inj,quad, With Preservative 12/06/2017  . Influenza-Unspecified 11/18/2011, 11/10/2012, 11/25/2013, 12/04/2017  . Pneumococcal Conjugate-13 11/29/2014  . Pneumococcal Polysaccharide-23 02/29/2016  . Zoster Recombinat (Shingrix) 09/22/2018, 12/01/2018, 12/21/2018   Screening Tests Health Maintenance  Topic Date Due  . Fecal DNA (Cologuard)  08/01/1999  . MAMMOGRAM  02/11/2019  . INFLUENZA VACCINE  Completed  . DEXA SCAN  Completed  . Hepatitis C Screening  Completed  . PNA vac Low Risk Adult  Completed      Plan:   Keep all routine maintenance appointments.   Follow up 02/25/19 @ 930  Medicare Attestation I have personally reviewed: The patient's medical and social history Their use of alcohol, tobacco or illicit drugs Their current medications and supplements The patient's functional ability including ADLs,fall risks, home safety risks, cognitive, and hearing and visual impairment Diet and physical activities Evidence for depression    I have reviewed and discussed with  patient certain preventive protocols, quality metrics, and best practice recommendations.   OBrien-Blaney, Jo Cerone L, LPN  [id number]     I have reviewed the above information and agree with above.   Deborra Medina, MD

## 2019-02-25 NOTE — Assessment & Plan Note (Signed)
Repeat labs due,  Avoiding NSAIDs,  Nephrology follow up every 6 months   Lab Results  Component Value Date   CREATININE 1.16 09/16/2018

## 2019-02-25 NOTE — Assessment & Plan Note (Signed)
LAD, noted on CT abdomen . Asymptomatic.   Continue statin , aspirin and ACE inhibitor.

## 2019-02-25 NOTE — Assessment & Plan Note (Signed)
Does not check readings at home, RN visit neededfor BP reading

## 2019-02-25 NOTE — Progress Notes (Signed)
Virtual Visit via [email or web address]  This visit type was conducted due to national recommendations for restrictions regarding the COVID-19 pandemic (e.g. social distancing).  This format is felt to be most appropriate for this patient at this time.  All issues noted in this document were discussed and addressed.  No physical exam was performed (except for noted visual exam findings with Video Visits).   I connected with@ on 02/25/19 at  9:30 AM EST by a video enabled telemedicine application  and verified that I am speaking with the correct person using two identifiers. Location patient: home Location provider: work or home office Persons participating in the virtual visit: patient, provider  I discussed the limitations, risks, security and privacy concerns of performing an evaluation and management service by telephone and the availability of in person appointments. I also discussed with the patient that there may be a patient responsible charge related to this service. The patient expressed understanding and agreed to proceed.  Reason for visit: follow up   HPI:  68 yr old female wit HTN with CKD stage 3, b12 deficiency.  SEEING NEPHROLOGY IN January FOR 6 MONTH FOLLOW UP.  No changes to meds,  She is avoiding NSAIDs    The patient has no signs or symptoms of COVID 19 infection (fever, cough, sore throat  or shortness of breath beyond what is typical for patient).  Patient denies contact with other persons with the above mentioned symptoms or with anyone confirmed to have COVID 19 .  She has been minimizing her contact with the public and using a mask and hand sanitizer when she comes into any contact with the public. She leaves the house only once a week,  Not exercising    Still having recurrent vaginal discharge,  Workup in 2017 was negative for infection.  Only time it clears up is when she is taking prednsione  Gyn referral requested   HTN:  Does not own BP cuff.  Visiting R recently checked and  124/80 .  CKD:  Saw    ROS: See pertinent positives and negatives per HPI.  Past Medical History:  Diagnosis Date  . Allergy    seasonal rhinitis  . Arthritis    right shoulder,  no intervention  . Cancer ([place])    basal cell   . GERD (gastroesophageal reflux disease)   . Hyperlipidemia   . Hypertension   . Irritable bowel syndrome (IBS) Oct 2011   last colonoscopy showed diverticulum, no polyps  . Neuropathy    FEET  . Vertigo    OCCAS    Past Surgical History:  Procedure Laterality Date  . CATARACT EXTRACTION W/PHACO Left 04/09/2017   Procedure: CATARACT EXTRACTION PHACO AND INTRAOCULAR LENS PLACEMENT (IOC);  Surgeon: Birder Robson, MD;  Location: [facility] ORS;  Service: Ophthalmology;  Laterality: Left;  [place] 00:29.6 AP% 18.4 CDE 5.44 FLUID PACK LOT # [id number]  . CATARACT EXTRACTION W/PHACO Right 04/30/2017   Procedure: CATARACT EXTRACTION PHACO AND INTRAOCULAR LENS PLACEMENT (IOC);  Surgeon: Birder Robson, MD;  Location: [facility] ORS;  Service: Ophthalmology;  Laterality: Right;  [place] 00:43.1 AP% 13.7 CDE 5.91 Fluid Pack Lot # [id number]  . TONSILLECTOMY AND ADENOIDECTOMY  1956  . TUBAL LIGATION  1977  . VEIN SURGERY      Family History  Problem Relation Age of Onset  . Hyperlipidemia Mother   . Hyperlipidemia Father   . Hypertension Father   . Hyperlipidemia Maternal Grandmother   . Hyperlipidemia Maternal Grandfather   . Cancer  Cousin   . Breast cancer Neg Hx     SOCIAL HX:  reports that she quit smoking about 19 years ago. Her smoking use included cigarettes. She has never used smokeless tobacco. She reports that she does not drink alcohol or use drugs.  Current Outpatient Medications:  .  amLODipine (NORVASC) 2.5 MG tablet, TAKE ONE TABLET BY MOUTH DAILY, Disp: 90 tablet, Rfl: 1 .  aspirin 81 MG tablet, Take 81 mg by mouth daily.  , Disp: , Rfl:  .  cetirizine (ZYRTEC) 10 MG tablet, Take 10 mg by mouth at bedtime., Disp: , Rfl:  .  Cholecalciferol (VITAMIN  D3) 1000 UNITS CAPS, Take 1,000 Units by mouth daily. , Disp: , Rfl:  .  famotidine (PEPCID) 10 MG tablet, Take 10 mg by mouth daily., Disp: , Rfl:  .  Fluocinolone Acetonide 0.01 % OIL, Apply 1 application topically See admin instructions. Apply to affected ear daily as needed for dermatisis, Disp: , Rfl:  .  fluticasone (FLONASE) 50 MCG/ACT nasal spray, PLACE 2 SPRAYS INTO THE NOSE DAILY., Disp: 16 g, Rfl: 4 .  lactulose (CHRONULAC) 10 GM/15ML solution, TAKE 30 MILLILITERS EVERY 4 HOURS UNTIL CONSTIPATION IS RELIEVED, Disp: 473 mL, Rfl: 2 .  NON FORMULARY, Apply 1 application topically daily as needed (for dermatisis on face). Hydrocortisone 2.5%/Ketoconazole 2% Cream, Disp: , Rfl:  .  nystatin (MYCOSTATIN/NYSTOP) powder, Apply topically 2 (two) times daily. To rash until resolved., Disp: 15 g, Rfl: 0 .  psyllium (METAMUCIL SMOOTH TEXTURE) 58.6 % powder, Take 1 packet by mouth daily., Disp: 283 g, Rfl: 2 .  simvastatin (ZOCOR) 40 MG tablet, TAKE ONE TABLET BY MOUTH EVERY NIGHT AT BEDTIME, Disp: 90 tablet, Rfl: 1 .  telmisartan (MICARDIS) 80 MG tablet, TAKE ONE TABLET BY MOUTH EVERY EVENING, Disp: 30 tablet, Rfl: 0  EXAM:  VITALS per patient if applicable:  GENERAL: alert, oriented, appears well and in no acute distress  HEENT: atraumatic, conjunttiva clear, no obvious abnormalities on inspection of external nose and ears  NECK: normal movements of the head and neck  LUNGS: on inspection no signs of respiratory distress, breathing rate appears normal, no obvious gross SOB, gasping or wheezing  CV: no obvious cyanosis  MS: moves all visible extremities without noticeable abnormality  PSYCH/NEURO: pleasant and cooperative, no obvious depression or anxiety, speech and thought processing grossly intact  ASSESSMENT AND PLAN:  Discussed the following assessment and plan:  Stage 3b chronic kidney disease - Plan: PTH, Intact and Calcium, CBC with Differential/Platelet  Hepatic steatosis -  Plan: Comprehensive metabolic panel  Mixed hyperlipidemia - Plan: Lipid panel  Leukorrhea, vaginal, noninfectious - Plan: Ambulatory referral to Gynecology  Essential hypertension  Atherosclerosis of aorta (HCC), Chronic  Leukorrhea, vaginal, noninfectious Persistent,  Improved with prednison prescribed for other reasons.   Infectious workup in 2017 was negative.  Gyn referral in process   Hypertension Does not check readings at home, RN visit neededfor BP reading   CKD (chronic kidney disease) stage 3, GFR 30-59 ml/min (HCC) Repeat labs due,  Avoiding NSAIDs,  Nephrology follow up every 6 months   Lab Results  Component Value Date   CREATININE 1.16 09/16/2018     Atherosclerosis of aorta (HCC) LAD, noted on CT abdomen . Asymptomatic.   Continue statin , aspirin and ACE inhibitor.     I discussed the assessment and treatment plan with the patient. The patient was provided an opportunity to ask questions and all were answered. The patient agreed  with the plan and demonstrated an understanding of the instructions.   The patient was advised to call back or seek an in-person evaluation if the symptoms worsen or if the condition fails to improve as anticipated.  I provided 25 minutes of non-face-to-face time during this encounter.   Crecencio Mc, MD

## 2019-02-26 ENCOUNTER — Other Ambulatory Visit: Payer: Self-pay

## 2019-02-26 ENCOUNTER — Encounter: Payer: Self-pay | Admitting: *Deleted

## 2019-02-26 ENCOUNTER — Ambulatory Visit (INDEPENDENT_AMBULATORY_CARE_PROVIDER_SITE_OTHER): Payer: Medicare Other | Admitting: *Deleted

## 2019-02-26 VITALS — BP 138/86 | HR 59 | Temp 98.2°F | Resp 18 | Ht 66.0 in | Wt 202.5 lb

## 2019-02-26 DIAGNOSIS — K760 Fatty (change of) liver, not elsewhere classified: Secondary | ICD-10-CM | POA: Diagnosis not present

## 2019-02-26 DIAGNOSIS — E538 Deficiency of other specified B group vitamins: Secondary | ICD-10-CM

## 2019-02-26 DIAGNOSIS — N1832 Chronic kidney disease, stage 3b: Secondary | ICD-10-CM

## 2019-02-26 DIAGNOSIS — E782 Mixed hyperlipidemia: Secondary | ICD-10-CM

## 2019-02-26 LAB — CBC WITH DIFFERENTIAL/PLATELET
Basophils Absolute: 0 10*3/uL (ref 0.0–0.1)
Basophils Relative: 0.4 % (ref 0.0–3.0)
Eosinophils Absolute: 0.1 10*3/uL (ref 0.0–0.7)
Eosinophils Relative: 0.9 % (ref 0.0–5.0)
HCT: 38.6 % (ref 36.0–46.0)
Hemoglobin: 12.6 g/dL (ref 12.0–15.0)
Lymphocytes Relative: 27.2 % (ref 12.0–46.0)
Lymphs Abs: 2.3 10*3/uL (ref 0.7–4.0)
MCHC: 32.6 g/dL (ref 30.0–36.0)
MCV: 89.9 fl (ref 78.0–100.0)
Monocytes Absolute: 0.6 10*3/uL (ref 0.1–1.0)
Monocytes Relative: 7.1 % (ref 3.0–12.0)
Neutro Abs: 5.4 10*3/uL (ref 1.4–7.7)
Neutrophils Relative %: 64.4 % (ref 43.0–77.0)
Platelets: 210 10*3/uL (ref 150.0–400.0)
RBC: 4.29 Mil/uL (ref 3.87–5.11)
RDW: 13.3 % (ref 11.5–15.5)
WBC: 8.4 10*3/uL (ref 4.0–10.5)

## 2019-02-26 LAB — COMPREHENSIVE METABOLIC PANEL
ALT: 14 U/L (ref 0–35)
AST: 19 U/L (ref 0–37)
Albumin: 4.4 g/dL (ref 3.5–5.2)
Alkaline Phosphatase: 72 U/L (ref 39–117)
BUN: 21 mg/dL (ref 6–23)
CO2: 26 mEq/L (ref 19–32)
Calcium: 9.4 mg/dL (ref 8.4–10.5)
Chloride: 105 mEq/L (ref 96–112)
Creatinine, Ser: 1.21 mg/dL — ABNORMAL HIGH (ref 0.40–1.20)
GFR: 44.04 mL/min — ABNORMAL LOW (ref >60.00)
Glucose, Bld: 87 mg/dL (ref 70–99)
Potassium: 4.9 mEq/L (ref 3.5–5.1)
Sodium: 140 mEq/L (ref 135–145)
Total Bilirubin: 0.5 mg/dL (ref 0.2–1.2)
Total Protein: 6.7 g/dL (ref 6.0–8.3)

## 2019-02-26 LAB — LIPID PANEL
Cholesterol: 177 mg/dL (ref 0–200)
HDL: 53.9 mg/dL (ref >39.00)
NonHDL: 123.27
Total CHOL/HDL Ratio: 3
Triglycerides: 205 mg/dL — ABNORMAL HIGH (ref 0.0–149.0)
VLDL: 41 mg/dL — ABNORMAL HIGH (ref 0.0–40.0)

## 2019-02-26 LAB — LDL CHOLESTEROL, DIRECT: Direct LDL: 83 mg/dL

## 2019-02-26 MED ORDER — CYANOCOBALAMIN 1000 MCG/ML IJ SOLN
1000.0000 ug | Freq: Once | INTRAMUSCULAR | Status: AC
  Administered 2019-02-26: 13:00:00 1000 ug via INTRAMUSCULAR

## 2019-02-26 NOTE — Progress Notes (Addendum)
Patient presented for B 12 injection to right deltoid, patient voiced no concerns nor showed any signs of distress during injection.  Patient also here for Vital sign check and she was very upset due to Mammogram results On 02/25/19. Patient sister just has surgery for Breast cancer this month. Patient ask that I advise PCP.  Mammogram report reviewed; addition images on the right requested.  Patient notified

## 2019-02-27 ENCOUNTER — Telehealth: Payer: Self-pay | Admitting: Internal Medicine

## 2019-02-27 ENCOUNTER — Encounter: Payer: Self-pay | Admitting: *Deleted

## 2019-02-27 DIAGNOSIS — R928 Other abnormal and inconclusive findings on diagnostic imaging of breast: Secondary | ICD-10-CM

## 2019-02-27 DIAGNOSIS — N6310 Unspecified lump in the right breast, unspecified quadrant: Secondary | ICD-10-CM | POA: Insufficient documentation

## 2019-02-27 NOTE — Telephone Encounter (Signed)
My Chart message sent

## 2019-02-27 NOTE — Assessment & Plan Note (Signed)
Patient received notification directly from [place].  She has a FH breast cancer (her sister, at age 70) .  Diagnostic mammogram and ultrasound will be set up

## 2019-03-02 LAB — PTH, INTACT AND CALCIUM
Calcium: 9.5 mg/dL (ref 8.6–10.4)
PTH: 38 pg/mL (ref 14–64)

## 2019-03-04 ENCOUNTER — Ambulatory Visit
Admission: RE | Admit: 2019-03-04 | Discharge: 2019-03-04 | Disposition: A | Discharge status: Home / Self Care | Payer: Medicare Other | Source: Ambulatory Visit | Attending: Internal Medicine | Admitting: Internal Medicine

## 2019-03-04 ENCOUNTER — Other Ambulatory Visit: Payer: Self-pay | Admitting: Internal Medicine

## 2019-03-04 DIAGNOSIS — N6310 Unspecified lump in the right breast, unspecified quadrant: Secondary | ICD-10-CM

## 2019-03-04 DIAGNOSIS — N6311 Unspecified lump in the right breast, upper outer quadrant: Secondary | ICD-10-CM | POA: Diagnosis not present

## 2019-03-04 DIAGNOSIS — R928 Other abnormal and inconclusive findings on diagnostic imaging of breast: Secondary | ICD-10-CM | POA: Insufficient documentation

## 2019-03-06 ENCOUNTER — Telehealth: Payer: Self-pay | Admitting: Internal Medicine

## 2019-03-06 ENCOUNTER — Other Ambulatory Visit: Payer: Self-pay | Admitting: Internal Medicine

## 2019-03-06 DIAGNOSIS — R928 Other abnormal and inconclusive findings on diagnostic imaging of breast: Secondary | ICD-10-CM

## 2019-03-06 DIAGNOSIS — Z803 Family history of malignant neoplasm of breast: Secondary | ICD-10-CM | POA: Insufficient documentation

## 2019-03-06 NOTE — Telephone Encounter (Signed)
patient needs an U/S guided breast biopsy and can't see [facility] due to insurance issues. i made the referral to [place] but if he does not do ultrasound guided core biopsy, she'll need to go to Radiology. can you check and let me know?  Called [facility]. They are closed for the day. Will cb Monday morning to find out.

## 2019-03-06 NOTE — Assessment & Plan Note (Signed)
Sister was diagnosed within the past year.

## 2019-03-06 NOTE — Assessment & Plan Note (Addendum)
1 cm breast mass at 10:00 position noted on U/s and biopsy recommended.  Referral to  Dr Hampton Abbot in progress

## 2019-03-09 NOTE — Telephone Encounter (Signed)
Dr. Derrel Nip- Called [facility]/Dr. Piscoya's office. He does not do ultrasound guided breast biopsy's. He sends them to [place].  Thanks [employer]

## 2019-03-12 ENCOUNTER — Ambulatory Visit
Admission: RE | Admit: 2019-03-12 | Discharge: 2019-03-12 | Disposition: A | Discharge status: Home / Self Care | Payer: Medicare Other | Source: Ambulatory Visit | Attending: Internal Medicine | Admitting: Internal Medicine

## 2019-03-12 DIAGNOSIS — R928 Other abnormal and inconclusive findings on diagnostic imaging of breast: Secondary | ICD-10-CM | POA: Diagnosis not present

## 2019-03-12 DIAGNOSIS — N6311 Unspecified lump in the right breast, upper outer quadrant: Secondary | ICD-10-CM | POA: Diagnosis not present

## 2019-03-12 DIAGNOSIS — N6310 Unspecified lump in the right breast, unspecified quadrant: Secondary | ICD-10-CM | POA: Diagnosis not present

## 2019-03-12 DIAGNOSIS — N6031 Fibrosclerosis of right breast: Secondary | ICD-10-CM | POA: Diagnosis not present

## 2019-03-12 HISTORY — PX: BREAST BIOPSY: SHX20

## 2019-03-12 HISTORY — PX: OTHER SURGICAL HISTORY: SHX169

## 2019-03-13 LAB — SURGICAL PATHOLOGY

## 2019-03-20 DIAGNOSIS — N183 Chronic kidney disease, stage 3 unspecified: Secondary | ICD-10-CM | POA: Diagnosis not present

## 2019-03-20 DIAGNOSIS — N281 Cyst of kidney, acquired: Secondary | ICD-10-CM | POA: Diagnosis not present

## 2019-03-20 DIAGNOSIS — I129 Hypertensive chronic kidney disease with stage 1 through stage 4 chronic kidney disease, or unspecified chronic kidney disease: Secondary | ICD-10-CM | POA: Diagnosis not present

## 2019-03-23 NOTE — Assessment & Plan Note (Signed)
US guided biopsy was negative for cancer.  6 month diagnostic mammogram and [place] recommended

## 2019-03-25 ENCOUNTER — Other Ambulatory Visit: Payer: Self-pay

## 2019-03-26 DIAGNOSIS — R8290 Unspecified abnormal findings in urine: Secondary | ICD-10-CM | POA: Diagnosis not present

## 2019-03-26 DIAGNOSIS — N281 Cyst of kidney, acquired: Secondary | ICD-10-CM | POA: Diagnosis not present

## 2019-03-26 DIAGNOSIS — N1832 Chronic kidney disease, stage 3b: Secondary | ICD-10-CM | POA: Diagnosis not present

## 2019-03-31 ENCOUNTER — Ambulatory Visit (INDEPENDENT_AMBULATORY_CARE_PROVIDER_SITE_OTHER): Payer: Medicare Other | Admitting: *Deleted

## 2019-03-31 ENCOUNTER — Other Ambulatory Visit: Payer: Self-pay

## 2019-03-31 DIAGNOSIS — E538 Deficiency of other specified B group vitamins: Secondary | ICD-10-CM | POA: Diagnosis not present

## 2019-03-31 MED ORDER — CYANOCOBALAMIN 1000 MCG/ML IJ SOLN
1000.0000 ug | Freq: Once | INTRAMUSCULAR | Status: AC
  Administered 2019-03-31: 12:00:00 1000 ug via INTRAMUSCULAR

## 2019-03-31 NOTE — Progress Notes (Addendum)
Patient presented for B 12 injection to right deltoid, patient voiced no concerns nor showed any signs of distress during injection.  Reviewed.  Dr Scott 

## 2019-04-17 ENCOUNTER — Other Ambulatory Visit: Payer: Self-pay

## 2019-04-17 NOTE — Patient Outreach (Signed)
[facility]) Care Management  04/17/2019  RIHANNA STRINGFIELD 1950/08/29 [id number]   Medication Adherence call to Mrs. [place] Compliant Voice message left with a call back number. Mrs. Winkel is showing past due on Simvastatin 40 mg under [place].   [place] Management Direct Dial [phone]  Fax [phone] [email or web address]

## 2019-04-20 ENCOUNTER — Other Ambulatory Visit: Payer: Self-pay | Admitting: Internal Medicine

## 2019-04-30 ENCOUNTER — Ambulatory Visit: Payer: Medicare Other

## 2019-05-19 ENCOUNTER — Other Ambulatory Visit: Payer: Self-pay

## 2019-05-19 ENCOUNTER — Ambulatory Visit (INDEPENDENT_AMBULATORY_CARE_PROVIDER_SITE_OTHER): Payer: Medicare Other | Admitting: *Deleted

## 2019-05-19 DIAGNOSIS — E538 Deficiency of other specified B group vitamins: Secondary | ICD-10-CM

## 2019-05-19 MED ORDER — CYANOCOBALAMIN 1000 MCG/ML IJ SOLN
1000.0000 ug | Freq: Once | INTRAMUSCULAR | Status: AC
  Administered 2019-05-19: 1000 ug via INTRAMUSCULAR

## 2019-05-19 NOTE — Progress Notes (Addendum)
Patient presented for B 12 injection to right deltoid, patient voiced no concerns nor showed any signs of distress during injection.  Reviewed.  Dr Scott 

## 2019-06-02 ENCOUNTER — Ambulatory Visit: Payer: Medicare Other | Admitting: Obstetrics and Gynecology

## 2019-06-02 ENCOUNTER — Encounter: Payer: Self-pay | Admitting: Obstetrics and Gynecology

## 2019-06-02 ENCOUNTER — Other Ambulatory Visit: Payer: Self-pay

## 2019-06-02 VITALS — BP 121/72 | HR 71 | Ht 66.0 in | Wt 203.5 lb

## 2019-06-02 DIAGNOSIS — N898 Other specified noninflammatory disorders of vagina: Secondary | ICD-10-CM | POA: Diagnosis not present

## 2019-06-02 DIAGNOSIS — N952 Postmenopausal atrophic vaginitis: Secondary | ICD-10-CM

## 2019-06-02 NOTE — Progress Notes (Signed)
referral pt present for vaginal issues. Pt c/o of vaginal discharge, no odor, itching, burning, and no color. LMP 1995.

## 2019-06-02 NOTE — Patient Instructions (Signed)
Atrophic Vaginitis  Atrophic vaginitis is a condition in which the tissues that line the vagina become dry and thin. This condition is most common in women who have stopped having regular menstrual periods (are in menopause). This usually starts when a woman is 45-70 years old. That is the time when a woman's estrogen levels begin to drop (decrease). Estrogen is a female hormone. It helps to keep the tissues of the vagina moist. It stimulates the vagina to produce a clear fluid that lubricates the vagina for sexual intercourse. This fluid also protects the vagina from infection. Lack of estrogen can cause the lining of the vagina to get thinner and dryer. The vagina may also shrink in size. It may become less elastic. Atrophic vaginitis tends to get worse over time as a woman's estrogen level drops. What are the causes? This condition is caused by the normal drop in estrogen that happens around the time of menopause. What increases the risk? Certain conditions or situations may lower a woman's estrogen level, leading to a higher risk for atrophic vaginitis. You are more likely to develop this condition if:  You are taking medicines that block estrogen.  You have had your ovaries removed.  You are being treated for cancer with X-ray (radiation) or medicines (chemotherapy).  You have given birth or are breastfeeding.  You are older than age 50.  You smoke. What are the signs or symptoms? Symptoms of this condition include:  Pain, soreness, or bleeding during sexual intercourse (dyspareunia).  Vaginal burning, irritation, or itching.  Pain or bleeding when a speculum is used in a vaginal exam (pelvic exam).  Having burning pain when passing urine.  Vaginal discharge that is brown or yellow. In some cases, there are no symptoms. How is this diagnosed? This condition is diagnosed by taking a medical history and doing a physical exam. This will include a pelvic exam that checks the  vaginal tissues. Though rare, you may also have other tests, including:  A urine test.  A test that checks the acid balance in your vagina (acid balance test). How is this treated? Treatment for this condition depends on how severe your symptoms are. Treatment may include:  Using an over-the-counter vaginal lubricant before sex.  Using a long-acting vaginal moisturizer.  Using low-dose vaginal estrogen for moderate to severe symptoms that do not respond to other treatments. Options include creams, tablets, and inserts (vaginal rings). Before you use a vaginal estrogen, tell your health care provider if you have a history of: ? Breast cancer. ? Endometrial cancer. ? Blood clots. If you are not sexually active and your symptoms are very mild, you may not need treatment. Follow these instructions at home: Medicines  Take over-the-counter and prescription medicines only as told by your health care provider. Do not use herbal or alternative medicines unless your health care provider says that you can.  Use over-the-counter creams, lubricants, or moisturizers for dryness only as directed by your health care provider. General instructions  If your atrophic vaginitis is caused by menopause, discuss all of your menopause symptoms and treatment options with your health care provider.  Do not douche.  Do not use products that can make your vagina dry. These include: ? Scented feminine sprays. ? Scented tampons. ? Scented soaps.  Vaginal intercourse can help to improve blood flow and elasticity of vaginal tissue. If it hurts to have sex, try using a lubricant or moisturizer just before having intercourse. Contact a health care provider if:    Your discharge looks different than normal.  Your vagina has an unusual smell.  You have new symptoms.  Your symptoms do not improve with treatment.  Your symptoms get worse. Summary  Atrophic vaginitis is a condition in which the tissues that  line the vagina become dry and thin. It is most common in women who have stopped having regular menstrual periods (are in menopause).  Treatment options include using vaginal lubricants and low-dose vaginal estrogen.  Contact a health care provider if your vagina has an unusual smell, or if your symptoms get worse or do not improve after treatment. This information is not intended to replace advice given to you by your health care provider. Make sure you discuss any questions you have with your health care provider. Document Revised: 01/25/2017 Document Reviewed: 11/08/2016 Elsevier Patient Education  2020 Elsevier Inc.  

## 2019-06-02 NOTE — Progress Notes (Signed)
GYNECOLOGY CLINIC PROGRESS NOTE Subjective:   [name] is a 69 y.o. G2P2002 who presents today as a referral from her PCP.  Current complaints: vaginal discharge x 3 years. She reports that she also experiences itching and burning of the vaginal area and vulva. She has been worked up by her PCP in the past, however reports negative testing (was tested for yeast and bacterial infections and herpes).  She does have a remote history of hormone replacement therapy use x 2 years.     Gynecologic History No LMP recorded. Patient is postmenopausal. Has been menopausal for almost 25 years.  Last Pap: 12/09/2015. Results were: normal. No further pap smears needed.  Last mammogram: 01/2019. Results were: abnormal (BIRADS 0, possible mass in right breast). Underwent US-guided biopsy 03/12/2019.   Obstetric History OB History  Gravida Para Term Preterm AB Living  2 2 2     2   SAB TAB Ectopic Multiple Live Births          2    # Outcome Date GA Lbr Len/2nd Weight Sex Delivery Anes PTL Lv  2 Term 06/30/75    F Vag-Spont   LIV  1 Term 09/22/72    F Vag-Spont   LIV     The following portions of the patient's history were reviewed and updated as appropriate:  She  has a past medical history of Allergy, Arthritis, Cancer ([place]), GERD (gastroesophageal reflux disease), Hyperlipidemia, Hypertension, Irritable bowel syndrome (IBS) (Oct 2011), Neuropathy, and Vertigo.   She  has a past surgical history that includes Tubal ligation (1977); Tonsillectomy and adenoidectomy (1956); Vein Surgery; Cataract extraction w/PHACO (Left, 04/09/2017); Cataract extraction w/PHACO (Right, 04/30/2017); and Breast biopsy (Right, 03/12/2019).   Her family history includes Breast cancer (age of onset: 73) in her sister; Cancer in her cousin; Hyperlipidemia in her father, maternal grandfather, maternal grandmother, and mother; Hypertension in her father.   She  reports that she quit smoking about 20 years ago.  Her smoking use included cigarettes. She has never used smokeless tobacco. She reports that she does not drink alcohol or use drugs.    Current Outpatient Medications on File Prior to Visit  Medication Sig Dispense Refill  . amLODipine (NORVASC) 2.5 MG tablet TAKE ONE TABLET BY MOUTH DAILY 90 tablet 1  . aspirin 81 MG tablet Take 81 mg by mouth daily.      . cetirizine (ZYRTEC) 10 MG tablet Take 10 mg by mouth at bedtime.    . Cholecalciferol (VITAMIN D3) 1000 UNITS CAPS Take 1,000 Units by mouth daily.     . famotidine (PEPCID) 10 MG tablet Take 10 mg by mouth daily.    . Fluocinolone Acetonide 0.01 % OIL Apply 1 application topically See admin instructions. Apply to affected ear daily as needed for dermatisis    . fluticasone (FLONASE) 50 MCG/ACT nasal spray PLACE 2 SPRAYS INTO THE NOSE DAILY. 16 g 4  . lactulose (CHRONULAC) 10 GM/15ML solution TAKE 30 MILLILITERS EVERY 4 HOURS UNTIL CONSTIPATION IS RELIEVED 473 mL 2  . NON FORMULARY Apply 1 application topically daily as needed (for dermatisis on face). Hydrocortisone 2.5%/Ketoconazole 2% Cream    . nystatin (MYCOSTATIN/NYSTOP) powder Apply topically 2 (two) times daily. To rash until resolved. 15 g 0  . psyllium (METAMUCIL SMOOTH TEXTURE) 58.6 % powder Take 1 packet by mouth daily. 283 g 2  . simvastatin (ZOCOR) 40 MG tablet TAKE ONE TABLET BY MOUTH EVERY NIGHT AT BEDTIME 90 tablet 1  .  telmisartan (MICARDIS) 80 MG tablet TAKE ONE TABLET BY MOUTH EVERY EVENING 30 tablet 0   No current facility-administered medications on file prior to visit.    She is allergic to doxycycline; sulfa drugs cross reactors; and tetanus toxoids..    Review of Systems Pertinent items noted in HPI and remainder of comprehensive ROS otherwise negative.    Objective:    BP 121/72   Pulse 71   Ht 5\' 6"  (1.676 m)   Wt 203 lb 8 oz (92.3 kg)   BMI 32.85 kg/m  General appearance: alert and no distress Abdomen: soft, non-tender; bowel sounds normal; no  masses,  no organomegaly Pelvic: external genitalia normal, rectovaginal septum normal.  Vagina with scant thin clear mucoid discharge, mild vaginal atrophy.  Cervix with mottled ("strawberry cervix") appearing, no other lesions and no motion tenderness.  Uterus mobile, nontender, normal shape and size.  Adnexae non-palpable, nontender bilaterally.  Extremities: extremities normal, atraumatic, no cyanosis or edema Neurologic: Grossly normal      Lab:  Microscopic wet-mount exam performed, but inconclusive. Noted few epithelial cells, no clue cells. Unable to detect hyphae or trichomonads.  KOH done.    Assessment:   Vaginal discharge Vaginal atrophy  Plan:   Discussed that vaginal irritation (and perhaps discharge as well) could be due to atrophic vaginitis, or due to infection not previously screened for. Review of chart notes HSV, BV, and yeast testing. Will screen for trichomoniasis and mycoplasma/ureaplasma culture.  Discussed options for treatment if cultures are negative, including vaginal estrogen vs testosterone inserts, or Osphena.  Samples given for Osphena. To return in 3 weeks for reassessment.    Rubie Maid, MD [facility]

## 2019-06-03 ENCOUNTER — Other Ambulatory Visit: Payer: Self-pay | Admitting: Obstetrics and Gynecology

## 2019-06-05 LAB — GENITAL MYCOPLASMAS NAA, SWAB
Mycoplasma genitalium NAA: NEGATIVE
Mycoplasma hominis NAA: NEGATIVE
Ureaplasma spp NAA: NEGATIVE

## 2019-06-08 NOTE — Progress Notes (Signed)
Did this not also come with the BV/yeast? I thought that I ordered both.

## 2019-06-23 ENCOUNTER — Encounter: Payer: Self-pay | Admitting: Obstetrics and Gynecology

## 2019-06-23 ENCOUNTER — Other Ambulatory Visit: Payer: Self-pay

## 2019-06-23 ENCOUNTER — Ambulatory Visit: Payer: Medicare Other | Admitting: Obstetrics and Gynecology

## 2019-06-23 ENCOUNTER — Ambulatory Visit (INDEPENDENT_AMBULATORY_CARE_PROVIDER_SITE_OTHER): Payer: Medicare Other | Admitting: *Deleted

## 2019-06-23 VITALS — BP 132/80 | HR 78 | Ht 66.0 in | Wt 206.2 lb

## 2019-06-23 DIAGNOSIS — E538 Deficiency of other specified B group vitamins: Secondary | ICD-10-CM | POA: Diagnosis not present

## 2019-06-23 DIAGNOSIS — N952 Postmenopausal atrophic vaginitis: Secondary | ICD-10-CM

## 2019-06-23 MED ORDER — PREMARIN 0.625 MG/GM VA CREA: 1.0000 | TOPICAL_CREAM | VAGINAL | 12 refills | Status: DC

## 2019-06-23 NOTE — Progress Notes (Signed)
    GYNECOLOGY PROGRESS NOTE  Subjective:    Patient ID: Jillian [name], female    DOB: April 23, 1950, 69 y.o.   MRN: [id number]  HPI  Patient is a 69 y.o. G65P2002 female who presents for 3 week follow up of vaginal atrophy. She was initiated on [place] last visit. Patient reports that she does not see much improvement in her symptoms.  Still noting vaginal irritation and burning with discharge, despite negative vaginitis workup.   The following portions of the patient's history were reviewed and updated as appropriate: allergies, current medications, past family history, past medical history, past social history, past surgical history and problem list.  Review of Systems Pertinent items noted in HPI and remainder of comprehensive ROS otherwise negative.   Objective:   Blood pressure 132/80, pulse 78, height 5\' 6"  (1.676 m), weight 206 lb 3.2 oz (93.5 kg). General appearance: alert and no distress Remainder of exam deferred today.    Labs:  Orders Only on 06/03/2019  Component Date Value Ref Range Status  . Mycoplasma genitalium NAA 06/03/2019 Negative  Negative Final  . Mycoplasma hominis NAA 06/03/2019 Negative  Negative Final  . Ureaplasma spp NAA 06/03/2019 Negative  Negative Final      Assessment:   Atrophic vaginitis  Plan:   - Discussed other options for treatment of vaginitis again, including local estrogen therapy (ring, cream, tablet) or local testosterone therapy (with [place]).  Patient desires to try estrogen cream. Given samples of Premarin cream to use daily for 2 weeks, then can decrease to 3 times weekly.   - To return in 4 weeks.     Rubie Maid, MD [facility]

## 2019-06-23 NOTE — Progress Notes (Signed)
Pt present for follow up for vaginal discharge. Pt stated that the samples given to her at the last visit did not help. Pt c/o vaginal discharge w/o odor, vaginal itching and burning.

## 2019-06-24 ENCOUNTER — Encounter: Payer: Self-pay | Admitting: Obstetrics and Gynecology

## 2019-06-24 NOTE — Patient Instructions (Signed)
Atrophic Vaginitis Atrophic vaginitis is a condition in which the tissues that line the vagina become dry and thin. This condition occurs in women who have stopped having their period. It is caused by a drop in a female hormone (estrogen). This hormone helps:  To keep the vagina moist.  To make a clear fluid. This clear fluid helps: ? To make the vagina ready for sex. ? To protect the vagina from infection. If the lining of the vagina is dry and thin, it may cause irritation, burning, or itchiness. It may also:  Make sex painful.  Make an exam of your vagina painful.  Cause bleeding.  Make you lose interest in sex.  Cause a burning feeling when you pee (urinate).  Cause a brown or yellow fluid to come from your vagina. Some women do not have symptoms. Follow these instructions at home: Medicines  Take over-the-counter and prescription medicines only as told by your doctor.  Do not use herbs or other medicines unless your doctor says it is okay.  Use medicines for for dryness. These include: ? Oils to make the vagina soft. ? Creams. ? Moisturizers. General instructions  Do not douche.  Do not use products that can make your vagina dry. These include: ? Scented sprays. ? Scented tampons. ? Scented soaps.  Sex can help increase blood flow and soften the tissue in the vagina. If it hurts to have sex: ? Tell your partner. ? Use products to make sex more comfortable. Use these only as told by your doctor. Contact a doctor if you:  Have discharge from the vagina that is different than usual.  Have a bad smell coming from your vagina.  Have new symptoms.  Do not get better.  Get worse. Summary  Atrophic vaginitis is a condition in which the lining of the vagina becomes dry and thin.  This condition affects women who have stopped having their periods.  Treatment may include using products that help make the vagina soft.  Call a doctor if do not get better with  treatment. This information is not intended to replace advice given to you by your health care provider. Make sure you discuss any questions you have with your health care provider. Document Revised: 02/25/2017 Document Reviewed: 02/25/2017 Elsevier Patient Education  2020 Elsevier Inc.  

## 2019-06-25 MED ORDER — CYANOCOBALAMIN 1000 MCG/ML IJ SOLN
1000.0000 ug | Freq: Once | INTRAMUSCULAR | Status: AC
  Administered 2019-06-23: 15:00:00 1000 ug via INTRAMUSCULAR

## 2019-06-25 NOTE — Progress Notes (Addendum)
Patient presented for B 12 injection to left deltoid, patient voiced no concerns nor showed any signs of distress during injection.  Reviewed.  Dr Scott 

## 2019-07-21 ENCOUNTER — Other Ambulatory Visit (HOSPITAL_COMMUNITY)
Admission: RE | Admit: 2019-07-21 | Discharge: 2019-07-21 | Disposition: A | Discharge status: Home / Self Care | Payer: Medicare Other | Source: Ambulatory Visit | Attending: Obstetrics and Gynecology | Admitting: Obstetrics and Gynecology

## 2019-07-21 ENCOUNTER — Ambulatory Visit: Payer: Medicare Other | Admitting: Obstetrics and Gynecology

## 2019-07-21 ENCOUNTER — Encounter: Payer: Self-pay | Admitting: Obstetrics and Gynecology

## 2019-07-21 ENCOUNTER — Other Ambulatory Visit: Payer: Self-pay

## 2019-07-21 VITALS — BP 130/82 | HR 78 | Ht 66.0 in | Wt 205.3 lb

## 2019-07-21 DIAGNOSIS — N952 Postmenopausal atrophic vaginitis: Secondary | ICD-10-CM

## 2019-07-21 DIAGNOSIS — L308 Other specified dermatitis: Secondary | ICD-10-CM | POA: Diagnosis not present

## 2019-07-21 DIAGNOSIS — L292 Pruritus vulvae: Secondary | ICD-10-CM | POA: Diagnosis not present

## 2019-07-21 NOTE — Progress Notes (Signed)
Pt present for follow up appointment for vaginal atrophy. Pt stated that her symptoms are still the same just noticed that since using the Premarin Cream her breast are sore and vaginal discharge.

## 2019-07-21 NOTE — Progress Notes (Signed)
    GYNECOLOGY PROGRESS NOTE  Subjective:    Patient ID: Jillian [name], female    DOB: 09-25-1950, 69 y.o.   MRN: [id number]  HPI  Patient is a 69 y.o. G42P2002 female who presents for 1 month f/u of atrophic vaginitis.  She was switched from [place] to Premarin cream last month as she did not feel that the [place] was effective.  Today she reports that she is still having similar symptoms, however notes symptoms are now mostly external.  Also noting more of a discharge since use of the Premarin cream. She has had multiple vaginitis screens which have all been negative, including screening for atypical pathogens such as Mycoplasma.   The following portions of the patient's history were reviewed and updated as appropriate: allergies, current medications, past family history, past medical history, past social history, past surgical history and problem list.  Review of Systems Pertinent items noted in HPI and remainder of comprehensive ROS otherwise negative.   Objective:   Blood pressure 130/82, pulse 78, height 5\' 6"  (1.676 m), weight 205 lb 4.8 oz (93.1 kg). General appearance: alert and no distress Abdomen: soft, non-tender; bowel sounds normal; no masses,  no organomegaly Pelvic: external genitalia normal (but mild pallor appreciated), rectovaginal septum normal.  Vagina with scant thin white discharge.  Cervix normal appearing, no lesions. Bimanual exam not performed.    Assessment:   Atrophic vaginitis Vulvar itching  Plan:   - Advised to continue use of the Premarin cream.  Also encouraged to use Premarin to external vulva.  - Biopsy of the vulva performed today (see procedure note) to rule out other vulvar pathology as cause for persistent itching.  Will notify by phone of results.  - RTC in 1 month to reassess symptoms.     VULVAR BIOPSY NOTE The indications for vulvar biopsy (rule out neoplasia, establish lichen sclerosus diagnosis) were reviewed.   Risks of the biopsy  including pain, bleeding, infection, inadequate specimen, scarring and need for additional procedures  were discussed. The patient stated understanding and agreed to undergo procedure today. Consent was signed,  time out performed.  The patient's vulva was prepped with Betadine. 1% lidocaine was injected into the left vulva. A 3-mm punch biopsy was done, biopsy tissue was picked up with sterile forceps and sterile scissors were used to excise the lesion.  Small bleeding was noted and hemostasis was achieved using silver nitrate sticks.  The patient tolerated the procedure well. Post-procedure instructions  (pelvic rest for one week) were given to the patient. The patient is to call with heavy bleeding, fever greater than 100.4, foul smelling vaginal discharge or other concerns.    Rubie Maid, MD [facility]  .

## 2019-07-21 NOTE — Patient Instructions (Signed)
-   Use premarin cream to vulva daily for 2 weeks, then decrease to twice weekly after this.  - Continue to use premarin cream to inside of vagina twice weekly.

## 2019-07-23 ENCOUNTER — Other Ambulatory Visit: Payer: Self-pay

## 2019-07-23 ENCOUNTER — Ambulatory Visit (INDEPENDENT_AMBULATORY_CARE_PROVIDER_SITE_OTHER): Payer: Medicare Other

## 2019-07-23 DIAGNOSIS — E538 Deficiency of other specified B group vitamins: Secondary | ICD-10-CM

## 2019-07-23 MED ORDER — CYANOCOBALAMIN 1000 MCG/ML IJ SOLN
1000.0000 ug | Freq: Once | INTRAMUSCULAR | Status: AC
  Administered 2019-07-23: 1000 ug via INTRAMUSCULAR

## 2019-07-23 NOTE — Progress Notes (Signed)
Patient presented for B 12 injection to left deltoid, patient voiced no concerns nor showed any signs of distress during injection. 

## 2019-07-24 LAB — SURGICAL PATHOLOGY

## 2019-07-28 ENCOUNTER — Other Ambulatory Visit: Payer: Self-pay | Admitting: Internal Medicine

## 2019-07-28 DIAGNOSIS — N6310 Unspecified lump in the right breast, unspecified quadrant: Secondary | ICD-10-CM

## 2019-08-24 ENCOUNTER — Encounter: Payer: Self-pay | Admitting: Internal Medicine

## 2019-08-24 ENCOUNTER — Other Ambulatory Visit: Payer: Self-pay

## 2019-08-24 ENCOUNTER — Ambulatory Visit (INDEPENDENT_AMBULATORY_CARE_PROVIDER_SITE_OTHER): Payer: Medicare Other | Admitting: Internal Medicine

## 2019-08-24 VITALS — BP 128/82 | HR 74 | Temp 98.2°F | Resp 16 | Ht 66.0 in | Wt 205.0 lb

## 2019-08-24 DIAGNOSIS — Z1211 Encounter for screening for malignant neoplasm of colon: Secondary | ICD-10-CM | POA: Diagnosis not present

## 2019-08-24 DIAGNOSIS — I872 Venous insufficiency (chronic) (peripheral): Secondary | ICD-10-CM

## 2019-08-24 DIAGNOSIS — Z780 Asymptomatic menopausal state: Secondary | ICD-10-CM | POA: Diagnosis not present

## 2019-08-24 DIAGNOSIS — Z1231 Encounter for screening mammogram for malignant neoplasm of breast: Secondary | ICD-10-CM

## 2019-08-24 DIAGNOSIS — E782 Mixed hyperlipidemia: Secondary | ICD-10-CM

## 2019-08-24 DIAGNOSIS — E538 Deficiency of other specified B group vitamins: Secondary | ICD-10-CM

## 2019-08-24 DIAGNOSIS — I10 Essential (primary) hypertension: Secondary | ICD-10-CM

## 2019-08-24 DIAGNOSIS — N760 Acute vaginitis: Secondary | ICD-10-CM

## 2019-08-24 LAB — LIPID PANEL
Cholesterol: 161 mg/dL (ref 0–200)
HDL: 54.1 mg/dL (ref >39.00)
LDL Cholesterol: 72 mg/dL (ref 0–99)
NonHDL: 107.2
Total CHOL/HDL Ratio: 3
Triglycerides: 176 mg/dL — ABNORMAL HIGH (ref 0.0–149.0)
VLDL: 35.2 mg/dL (ref 0.0–40.0)

## 2019-08-24 LAB — COMPREHENSIVE METABOLIC PANEL
ALT: 13 U/L (ref 0–35)
AST: 18 U/L (ref 0–37)
Albumin: 4.4 g/dL (ref 3.5–5.2)
Alkaline Phosphatase: 69 U/L (ref 39–117)
BUN: 22 mg/dL (ref 6–23)
CO2: 26 mEq/L (ref 19–32)
Calcium: 9.8 mg/dL (ref 8.4–10.5)
Chloride: 104 mEq/L (ref 96–112)
Creatinine, Ser: 1.16 mg/dL (ref 0.40–1.20)
GFR: 46.18 mL/min — ABNORMAL LOW (ref >60.00)
Glucose, Bld: 92 mg/dL (ref 70–99)
Potassium: 4.6 mEq/L (ref 3.5–5.1)
Sodium: 139 mEq/L (ref 135–145)
Total Bilirubin: 0.5 mg/dL (ref 0.2–1.2)
Total Protein: 6.8 g/dL (ref 6.0–8.3)

## 2019-08-24 LAB — MICROALBUMIN / CREATININE URINE RATIO
Creatinine,U: 128 mg/dL
Microalb Creat Ratio: 0.8 mg/g (ref 0.0–30.0)
Microalb, Ur: 1.1 mg/dL (ref 0.0–1.9)

## 2019-08-24 MED ORDER — CYANOCOBALAMIN 1000 MCG/ML IJ SOLN
1000.0000 ug | Freq: Once | INTRAMUSCULAR | Status: AC
  Administered 2019-08-24: 1000 ug via INTRAMUSCULAR

## 2019-08-24 NOTE — Assessment & Plan Note (Signed)
Biopsy of right breast mass was done in January and negative for malignancy.  6 month follow up diagnostic mammogram is due

## 2019-08-24 NOTE — Assessment & Plan Note (Signed)
She receives her monthly  b12 injections at our office and was given today

## 2019-08-24 NOTE — Addendum Note (Signed)
Addended by: Adair Laundry on: 08/24/2019 12:10 PM   Modules accepted: Orders

## 2019-08-24 NOTE — Progress Notes (Signed)
Patient ID: Jillian [name], female    DOB: 24-Nov-1950  Age: 69 y.o. MRN: [id number]  The patient is here for annual  wellness examination and management of other chronic and acute problems.  This visit occurred during the SARS-CoV-2 public health emergency.  Safety protocols were in place, including screening questions prior to the visit, additional usage of staff PPE, and extensive cleaning of exam room while observing appropriate contact time as indicated for disinfecting solutions.    Patient has received both doses of the available COVID 19 vaccine without complications.  Patient continues to mask when outside of the home except when walking in yard or at safe distances from others .  Patient denies any change in mood or development of unhealthy behaviors resuting from the pandemic's restriction of activities and socialization.     The risk factors are reflected in the social history.  The roster of all physicians providing medical care to patient - is listed in the Snapshot section of the chart.  Activities of daily living:  The patient is 100% independent in all ADLs: dressing, toileting, feeding as well as independent mobility  Home safety : The patient has smoke detectors in the home. They wear seatbelts.  There are no firearms at home. There is no violence in the home.   There is no risks for hepatitis, STDs or HIV. There is no   history of blood transfusion. They have no travel history to infectious disease endemic areas of the world.  The patient has seen their dentist in the last six month. They have seen their eye doctor in the last year. They admit to slight hearing difficulty with regard to whispered voices and some television programs.  They have deferred audiologic testing in the last year.  They do not  have excessive sun exposure. Discussed the need for sun protection: hats, long sleeves and use of sunscreen if there is significant sun exposure.   Diet: the importance of a  healthy diet is discussed. They do have a healthy diet.  The benefits of regular aerobic exercise were discussed. She walks 4 times per week ,  20 minutes.   Depression screen: there are no signs or vegative symptoms of depression- irritability, change in appetite, anhedonia, sadness/tearfullness.  Cognitive assessment: the patient manages all their financial and personal affairs and is actively engaged. They could relate day,date,year and events; recalled 2/3 objects at 3 minutes; performed clock-face test normally.  The following portions of the patient's history were reviewed and updated as appropriate: allergies, current medications, past family history, past medical history,  past surgical history, past social history  and problem list.  Visual acuity was not assessed per patient preference since she has regular follow up with her ophthalmologist. Hearing and body mass index were assessed and reviewed.   During the course of the visit the patient was educated and counseled about appropriate screening and preventive services including : fall prevention , diabetes screening, nutrition counseling, colorectal cancer screening, and recommended immunizations.    CC: The primary encounter diagnosis was Colon cancer screening. Diagnoses of Encounter for screening mammogram for malignant neoplasm of breast, Mixed hyperlipidemia, Essential hypertension, Postmenopausal estrogen deficiency, Venous insufficiency of both lower extremities, B12 deficiency, and Vaginitis and vulvovaginitis were also pertinent to this visit.  1)  Lab Results  Component Value Date   VITAMINB12 248 07/09/2017     History Jaylin has a past medical history of Allergy, Arthritis, Cancer ([place]), GERD (gastroesophageal reflux disease), Hyperlipidemia, Hypertension, Irritable  bowel syndrome (IBS) (Oct 2011), Neuropathy, and Vertigo.   She has a past surgical history that includes Tubal ligation (1977); Tonsillectomy and  adenoidectomy (1956); Vein Surgery; Cataract extraction w/PHACO (Left, 04/09/2017); Cataract extraction w/PHACO (Right, 04/30/2017); Breast biopsy (Right, 03/12/2019); and right breast bx (03/12/2019).   Her family history includes Breast cancer (age of onset: 28) in her sister; Cancer in her cousin; Hyperlipidemia in her father, maternal grandfather, maternal grandmother, and mother; Hypertension in her father.She reports that she quit smoking about 20 years ago. Her smoking use included cigarettes. She has never used smokeless tobacco. She reports that she does not drink alcohol and does not use drugs.  Outpatient Medications Prior to Visit  Medication Sig Dispense Refill  . amLODipine (NORVASC) 2.5 MG tablet TAKE ONE TABLET BY MOUTH DAILY 90 tablet 1  . aspirin 81 MG tablet Take 81 mg by mouth daily.      . cetirizine (ZYRTEC) 10 MG tablet Take 10 mg by mouth at bedtime.    . Cholecalciferol (VITAMIN D3) 1000 UNITS CAPS Take 1,000 Units by mouth daily.     Marland Kitchen conjugated estrogens (PREMARIN) vaginal cream Place 1 Applicatorful vaginally 3 (three) times a week. Apply daily for x 2 weeks, then decrease to 3 times weekly. 42.5 g 12  . cyanocobalamin (,VITAMIN B-12,) 1000 MCG/ML injection Inject 1,000 mcg into the muscle every 30 (thirty) days.    . famotidine (PEPCID) 10 MG tablet Take 10 mg by mouth daily. As needed    . Fluocinolone Acetonide 0.01 % OIL Apply 1 application topically See admin instructions. Apply to affected ear daily as needed for dermatisis    . fluticasone (FLONASE) 50 MCG/ACT nasal spray PLACE 2 SPRAYS INTO THE NOSE DAILY. 16 g 4  . lactulose (CHRONULAC) 10 GM/15ML solution TAKE 30 MILLILITERS EVERY 4 HOURS UNTIL CONSTIPATION IS RELIEVED 473 mL 2  . NON FORMULARY Apply 1 application topically daily as needed (for dermatisis on face). Hydrocortisone 2.5%/Ketoconazole 2% Cream    . nystatin (MYCOSTATIN/NYSTOP) powder Apply topically 2 (two) times daily. To rash until resolved. 15 g 0   . Psyllium (METAMUCIL FIBER PO) Take by mouth. Take 1 tablet daily.    . simvastatin (ZOCOR) 40 MG tablet TAKE ONE TABLET BY MOUTH EVERY NIGHT AT BEDTIME 90 tablet 1  . telmisartan (MICARDIS) 80 MG tablet TAKE ONE TABLET BY MOUTH EVERY EVENING 30 tablet 0  . cyanocobalamin 1000 MCG tablet Inject into the muscle. (Patient not taking: Reported on 08/24/2019)    . psyllium (METAMUCIL SMOOTH TEXTURE) 58.6 % powder Take 1 packet by mouth daily. 283 g 2   No facility-administered medications prior to visit.    Review of Systems   Patient denies headache, fevers, malaise, unintentional weight loss, skin rash, eye pain, sinus congestion and sinus pain, sore throat, dysphagia,  hemoptysis , cough, dyspnea, wheezing, chest pain, palpitations, orthopnea, edema, abdominal pain, nausea, melena, diarrhea, constipation, flank pain, dysuria, hematuria, urinary  Frequency, nocturia, numbness, tingling, seizures,  Focal weakness, Loss of consciousness,  Tremor, insomnia, depression, anxiety, and suicidal ideation.      Objective:  BP 128/82 (BP Location: Left Arm, Patient Position: Sitting, Cuff Size: Normal)   Pulse 74   Temp 98.2 F (36.8 C) (Temporal)   Resp 16   Ht 5\' 6"  (1.676 m)   Wt 205 lb (93 kg)   SpO2 95%   BMI 33.09 kg/m   Physical Exam  General appearance: alert, cooperative and appears stated age Head: Normocephalic, without obvious  abnormality, atraumatic Eyes: conjunctivae/corneas clear. PERRL, EOM's intact. Fundi benign. Ears: normal TM's and external ear canals both ears Nose: Nares normal. Septum midline. Mucosa normal. No drainage or sinus tenderness. Throat: lips, mucosa, and tongue normal; teeth and gums normal Neck: no adenopathy, no carotid bruit, no JVD, supple, symmetrical, trachea midline and thyroid not enlarged, symmetric, no tenderness/mass/nodules Lungs: clear to auscultation bilaterally Breasts: normal appearance, no masses or tenderness Heart: regular rate and  rhythm, S1, S2 normal, no murmur, click, rub or gallop Abdomen: soft, non-tender; bowel sounds normal; no masses,  no organomegaly Extremities: extremities normal, atraumatic, no cyanosis or edema Pulses: 2+ and symmetric Skin: Skin color, texture, turgor normal. No rashes or lesions Neurologic: Alert and oriented X 3, normal strength and tone. Normal symmetric reflexes. Normal coordination and gait.     Assessment & Plan:   Problem List Items Addressed This Visit      Unprioritized   Venous insufficiency of leg    She does not have chronic edema unless she sits for long periods of time       Vaginitis and vulvovaginitis    S/p biopsy  by Dr Marcelline Mates.       Screening for breast cancer    Biopsy of right breast mass was done in January and negative for malignancy.  6 month follow up diagnostic mammogram is due       Hypertension    Well controlled on current regimen continue amlodipine 2.5 mg daily and telmisartan       Relevant Orders   Comprehensive metabolic panel   Microalbumin / creatinine urine ratio   Hyperlipidemia   Relevant Orders   Lipid panel   B12 deficiency    She receives her monthly  b12 injections at our office and was given today        Other Visit Diagnoses    Colon cancer screening    -  Primary   Relevant Orders   Cologuard   Postmenopausal estrogen deficiency       Relevant Orders   DG Bone Density      I have discontinued Belenda Cruise A. Brucato's psyllium. I am also having her maintain her Vitamin D3, aspirin, fluticasone, famotidine, cetirizine, Fluocinolone Acetonide, NON FORMULARY, lactulose, nystatin, telmisartan, simvastatin, amLODipine, Premarin, Psyllium (METAMUCIL FIBER PO), and cyanocobalamin.  No orders of the defined types were placed in this encounter.   Medications Discontinued During This Encounter  Medication Reason  . psyllium (METAMUCIL SMOOTH TEXTURE) 58.6 % powder Patient Preference  . cyanocobalamin 1000 MCG tablet Error     Follow-up: No follow-ups on file.   Crecencio Mc, MD

## 2019-08-24 NOTE — Assessment & Plan Note (Signed)
Well controlled on current regimen continue amlodipine 2.5 mg daily and telmisartan

## 2019-08-24 NOTE — Patient Instructions (Addendum)
I have  initiated  the order for your colon  cancer screening  Test.  It is called  Cologuard.  It will be delivered to your house, and you will send off a stool sample in the envelope it provides.  Your last bone density test was in 2016,  So I recommend repeating it this year and have ordered it       Health Maintenance After Age 70 After age 1, you are at a higher risk for certain long-term diseases and infections as well as injuries from falls. Falls are a major cause of broken bones and head injuries in people who are older than age 32. Getting regular preventive care can help to keep you healthy and well. Preventive care includes getting regular testing and making lifestyle changes as recommended by your health care provider. Talk with your health care provider about:  Which screenings and tests you should have. A screening is a test that checks for a disease when you have no symptoms.  A diet and exercise plan that is right for you. What should I know about screenings and tests to prevent falls? Screening and testing are the best ways to find a health problem early. Early diagnosis and treatment give you the best chance of managing medical conditions that are common after age 37. Certain conditions and lifestyle choices may make you more likely to have a fall. Your health care provider may recommend:  Regular vision checks. Poor vision and conditions such as cataracts can make you more likely to have a fall. If you wear glasses, make sure to get your prescription updated if your vision changes.  Medicine review. Work with your health care provider to regularly review all of the medicines you are taking, including over-the-counter medicines. Ask your health care provider about any side effects that may make you more likely to have a fall. Tell your health care provider if any medicines that you take make you feel dizzy or sleepy.  Osteoporosis screening. Osteoporosis is a condition that  causes the bones to get weaker. This can make the bones weak and cause them to break more easily.  Blood pressure screening. Blood pressure changes and medicines to control blood pressure can make you feel dizzy.  Strength and balance checks. Your health care provider may recommend certain tests to check your strength and balance while standing, walking, or changing positions.  Foot health exam. Foot pain and numbness, as well as not wearing proper footwear, can make you more likely to have a fall.  Depression screening. You may be more likely to have a fall if you have a fear of falling, feel emotionally low, or feel unable to do activities that you used to do.  Alcohol use screening. Using too much alcohol can affect your balance and may make you more likely to have a fall. What actions can I take to lower my risk of falls? General instructions  Talk with your health care provider about your risks for falling. Tell your health care provider if: ? You fall. Be sure to tell your health care provider about all falls, even ones that seem minor. ? You feel dizzy, sleepy, or off-balance.  Take over-the-counter and prescription medicines only as told by your health care provider. These include any supplements.  Eat a healthy diet and maintain a healthy weight. A healthy diet includes low-fat dairy products, low-fat (lean) meats, and fiber from whole grains, beans, and lots of fruits and vegetables. Home safety  Remove any tripping hazards, such as rugs, cords, and clutter.  Install safety equipment such as grab bars in bathrooms and safety rails on stairs.  Keep rooms and walkways well-lit. Activity   Follow a regular exercise program to stay fit. This will help you maintain your balance. Ask your health care provider what types of exercise are appropriate for you.  If you need a cane or walker, use it as recommended by your health care provider.  Wear supportive shoes that have nonskid  soles. Lifestyle  Do not drink alcohol if your health care provider tells you not to drink.  If you drink alcohol, limit how much you have: ? 0-1 drink a day for women. ? 0-2 drinks a day for men.  Be aware of how much alcohol is in your drink. In the [place] one drink equals one typical bottle of beer (12 oz), one-half glass of wine (5 oz), or one shot of hard liquor (1 oz).  Do not use any products that contain nicotine or tobacco, such as cigarettes and e-cigarettes. If you need help quitting, ask your health care provider. Summary  Having a healthy lifestyle and getting preventive care can help to protect your health and wellness after age 66.  Screening and testing are the best way to find a health problem early and help you avoid having a fall. Early diagnosis and treatment give you the best chance for managing medical conditions that are more common for people who are older than age 56.  Falls are a major cause of broken bones and head injuries in people who are older than age 86. Take precautions to prevent a fall at home.  Work with your health care provider to learn what changes you can make to improve your health and wellness and to prevent falls. This information is not intended to replace advice given to you by your health care provider. Make sure you discuss any questions you have with your health care provider. Document Revised: 06/05/2018 Document Reviewed: 12/26/2016 Elsevier Patient Education  2020 [employer].

## 2019-08-24 NOTE — Assessment & Plan Note (Signed)
S/p biopsy  by Dr Marcelline Mates.

## 2019-08-24 NOTE — Assessment & Plan Note (Signed)
She does not have chronic edema unless she sits for long periods of time

## 2019-08-25 ENCOUNTER — Ambulatory Visit: Payer: Medicare Other | Admitting: Obstetrics and Gynecology

## 2019-08-25 ENCOUNTER — Encounter: Payer: Self-pay | Admitting: Obstetrics and Gynecology

## 2019-08-25 VITALS — BP 127/78 | HR 66 | Ht 66.0 in | Wt 206.8 lb

## 2019-08-25 DIAGNOSIS — N952 Postmenopausal atrophic vaginitis: Secondary | ICD-10-CM

## 2019-08-25 DIAGNOSIS — L292 Pruritus vulvae: Secondary | ICD-10-CM | POA: Diagnosis not present

## 2019-08-25 DIAGNOSIS — L309 Dermatitis, unspecified: Secondary | ICD-10-CM

## 2019-08-25 MED ORDER — INTRAROSA 6.5 MG VA INST: 1.0000 | VAGINAL_INSERT | Freq: Every day | VAGINAL | 11 refills | Status: DC

## 2019-08-25 NOTE — Progress Notes (Signed)
    GYNECOLOGY PROGRESS NOTE  Subjective:    Patient ID: Jillian [name], female    DOB: 12/27/1950, 69 y.o.   MRN: [id number]  HPI  Patient is a 69 y.o. G70P2002 female who presents for 1 month f/u of atrophic vaginitis.  She notes some improvement of symptoms on the Premarin cream internally, however externally feels like it was causing more irritation so she stopped it.  Had vulvar biopsy performed last visit, pathology noted spongiotic dermatitis (chronic dermatitis).  Would like to switch over to [place].    The following portions of the patient's history were reviewed and updated as appropriate: allergies, current medications, past family history, past medical history, past social history, past surgical history and problem list.  Review of Systems Pertinent items noted in HPI and remainder of comprehensive ROS otherwise negative.   Objective:   Blood pressure 127/78, pulse 66, height 5\' 6"  (1.676 m), weight 206 lb 12.8 oz (93.8 kg). General appearance: alert and no distress Remainder of exam deferred today.    Assessment:   Atrophic vaginitis Vulvar itching/chronic dermatitis  Plan:   - Patient has improved some with use of Premarin cream for management of the atrophic vaginitis.  However would like to change over to [place]. Given 2-week sample of [place], and will give prescription.  - Advised to use hydrocortisone cream externally for 1-2 weeks for vulvar dermatitis. Can discontinue use of Premarin cream at this time.  - To f/u in 3 months to reassess symptoms.     Jillian Maid, MD [facility]

## 2019-08-25 NOTE — Progress Notes (Signed)
Pt present for follow up for vaginal atrophic. Pt stated that she has noticed an improvement in her atrophy since her last visit. Pt stated that she is still having some itching in the vaginal area and stopped using the cream due to it was causing the area to increase in symptoms.

## 2019-08-25 NOTE — Patient Instructions (Signed)
Prasterone vaginal insert What is this medicine? PRASTERONE (PRAS ter one), also known as DEHYDROEPIANDROSTERONE (DHEA) is used to treat females who experience painful sexual intercourse, a symptom of menopause that occurs due to changes in and around the vagina. This medicine may be used for other purposes; ask your health care provider or pharmacist if you have questions. COMMON BRAND NAME(S): INTRAROSA What should I tell my health care provider before I take this medicine? They need to know if you have any of these conditions:  cancer, such as breast, uterine, or other cancer  history of vaginal bleeding  an unusual or allergic reaction to prasterone, DHEA, other hormones, medicines, foods, dyes, or preservatives  pregnant or trying to get pregnant  breast-feeding How should I use this medicine? This medicine is for vaginal use only. Do not take by mouth. Follow the directions on the prescription label. Read package directions carefully before using. Wash hands before and after use. Use this medicine at bedtime. Do not use it more often than directed. Do not stop using except on your doctor's advice. Talk to your pediatrician regarding the use of this medicine in children. This medicine is not approved for use in children. Overdosage: If you think you have taken too much of this medicine contact a poison control center or emergency room at once. NOTE: This medicine is only for you. Do not share this medicine with others. What if I miss a dose? If you miss a dose, use it as soon as you can. If it is almost time for your next dose, use only that dose. Do not use double or extra doses. What may interact with this medicine? Interactions are not expected. Do not use any other vaginal products without telling your doctor or health care professional. This list may not describe all possible interactions. Give your health care provider a list of all the medicines, herbs, non-prescription drugs,  or dietary supplements you use. Also tell them if you smoke, drink alcohol, or use illegal drugs. Some items may interact with your medicine. What should I watch for while using this medicine? Visit your doctor or health care professional for a regular check on your progress. This medicine may cause changes on a cervical Pap smear. You will receive regular pelvic exams. What side effects may I notice from receiving this medicine? Side effects that you should report to your doctor or health care professional as soon as possible:  allergic reactions like skin rash, itching or hives Side effects that usually do not require medical attention (report to your doctor or health care professional if they continue or are bothersome):  vaginal discharge This list may not describe all possible side effects. Call your doctor for medical advice about side effects. You may report side effects to FDA at 1-800-FDA-1088. Where should I keep my medicine? Keep out of the reach of children. Store at room temperature or in a refrigerator between 5 and 30 degrees C (41 and 86 degrees F). Throw away any unused medicine after the expiration date. NOTE: This sheet is a summary. It may not cover all possible information. If you have questions about this medicine, talk to your doctor, pharmacist, or health care provider.  2020 Elsevier/Gold Standard (2015-08-09 12:30:18)

## 2019-09-04 DIAGNOSIS — R6889 Other general symptoms and signs: Secondary | ICD-10-CM | POA: Diagnosis not present

## 2019-09-04 DIAGNOSIS — Z1211 Encounter for screening for malignant neoplasm of colon: Secondary | ICD-10-CM | POA: Diagnosis not present

## 2019-09-04 LAB — COLOGUARD: Cologuard: NEGATIVE

## 2019-09-10 ENCOUNTER — Ambulatory Visit
Admission: RE | Admit: 2019-09-10 | Discharge: 2019-09-10 | Disposition: A | Discharge status: Home / Self Care | Payer: Medicare Other | Source: Ambulatory Visit | Attending: Internal Medicine | Admitting: Internal Medicine

## 2019-09-10 DIAGNOSIS — N6315 Unspecified lump in the right breast, overlapping quadrants: Secondary | ICD-10-CM | POA: Diagnosis not present

## 2019-09-10 DIAGNOSIS — N6310 Unspecified lump in the right breast, unspecified quadrant: Secondary | ICD-10-CM

## 2019-09-10 DIAGNOSIS — Z87891 Personal history of nicotine dependence: Secondary | ICD-10-CM | POA: Diagnosis not present

## 2019-09-10 DIAGNOSIS — R928 Other abnormal and inconclusive findings on diagnostic imaging of breast: Secondary | ICD-10-CM | POA: Diagnosis not present

## 2019-09-10 DIAGNOSIS — N6311 Unspecified lump in the right breast, upper outer quadrant: Secondary | ICD-10-CM | POA: Diagnosis not present

## 2019-09-10 DIAGNOSIS — R29890 Loss of height: Secondary | ICD-10-CM | POA: Diagnosis not present

## 2019-09-10 DIAGNOSIS — Z780 Asymptomatic menopausal state: Secondary | ICD-10-CM | POA: Diagnosis not present

## 2019-09-10 DIAGNOSIS — M8589 Other specified disorders of bone density and structure, multiple sites: Secondary | ICD-10-CM | POA: Diagnosis not present

## 2019-09-12 LAB — COLOGUARD: COLOGUARD: NEGATIVE

## 2019-09-14 ENCOUNTER — Encounter: Payer: Self-pay | Admitting: Internal Medicine

## 2019-09-14 DIAGNOSIS — M8580 Other specified disorders of bone density and structure, unspecified site: Secondary | ICD-10-CM | POA: Insufficient documentation

## 2019-09-23 ENCOUNTER — Telehealth: Payer: Self-pay | Admitting: Internal Medicine

## 2019-09-23 NOTE — Telephone Encounter (Signed)
The results of patient's cologuard is negative. Results abstracted and MyChart message sent

## 2019-09-24 ENCOUNTER — Other Ambulatory Visit: Payer: Self-pay

## 2019-09-24 ENCOUNTER — Ambulatory Visit (INDEPENDENT_AMBULATORY_CARE_PROVIDER_SITE_OTHER): Payer: Medicare Other

## 2019-09-24 DIAGNOSIS — E538 Deficiency of other specified B group vitamins: Secondary | ICD-10-CM | POA: Diagnosis not present

## 2019-09-24 MED ORDER — CYANOCOBALAMIN 1000 MCG/ML IJ SOLN
1000.0000 ug | Freq: Once | INTRAMUSCULAR | Status: AC
  Administered 2019-09-24: 1000 ug via INTRAMUSCULAR

## 2019-09-24 NOTE — Progress Notes (Signed)
Patient presented for B 12 injection to left deltoid, patient voiced no concerns nor showed any signs of distress during injection. 

## 2019-09-25 ENCOUNTER — Encounter: Payer: Self-pay | Admitting: Emergency Medicine

## 2019-09-25 ENCOUNTER — Emergency Department: Payer: Medicare Other

## 2019-09-25 ENCOUNTER — Telehealth: Payer: Self-pay | Admitting: Internal Medicine

## 2019-09-25 ENCOUNTER — Emergency Department
Admission: EM | Admit: 2019-09-25 | Discharge: 2019-09-25 | Disposition: A | Discharge status: Home / Self Care | Payer: Medicare Other | Attending: Emergency Medicine | Admitting: Emergency Medicine

## 2019-09-25 ENCOUNTER — Other Ambulatory Visit: Payer: Self-pay

## 2019-09-25 DIAGNOSIS — Z79899 Other long term (current) drug therapy: Secondary | ICD-10-CM | POA: Diagnosis not present

## 2019-09-25 DIAGNOSIS — M7989 Other specified soft tissue disorders: Secondary | ICD-10-CM | POA: Diagnosis not present

## 2019-09-25 DIAGNOSIS — R2242 Localized swelling, mass and lump, left lower limb: Secondary | ICD-10-CM | POA: Diagnosis present

## 2019-09-25 DIAGNOSIS — Z87891 Personal history of nicotine dependence: Secondary | ICD-10-CM | POA: Diagnosis not present

## 2019-09-25 DIAGNOSIS — I129 Hypertensive chronic kidney disease with stage 1 through stage 4 chronic kidney disease, or unspecified chronic kidney disease: Secondary | ICD-10-CM | POA: Diagnosis not present

## 2019-09-25 DIAGNOSIS — I809 Phlebitis and thrombophlebitis of unspecified site: Secondary | ICD-10-CM | POA: Diagnosis not present

## 2019-09-25 DIAGNOSIS — I80292 Phlebitis and thrombophlebitis of other deep vessels of left lower extremity: Secondary | ICD-10-CM | POA: Insufficient documentation

## 2019-09-25 DIAGNOSIS — N183 Chronic kidney disease, stage 3 unspecified: Secondary | ICD-10-CM | POA: Insufficient documentation

## 2019-09-25 DIAGNOSIS — I808 Phlebitis and thrombophlebitis of other sites: Secondary | ICD-10-CM | POA: Diagnosis not present

## 2019-09-25 DIAGNOSIS — C4491 Basal cell carcinoma of skin, unspecified: Secondary | ICD-10-CM | POA: Diagnosis not present

## 2019-09-25 DIAGNOSIS — I8002 Phlebitis and thrombophlebitis of superficial vessels of left lower extremity: Secondary | ICD-10-CM | POA: Diagnosis not present

## 2019-09-25 MED ORDER — NAPROXEN 500 MG PO TABS
500.0000 mg | ORAL_TABLET | Freq: Once | ORAL | Status: DC
  Filled 2019-09-25: qty 1

## 2019-09-25 MED ORDER — DICLOFENAC SODIUM 1 % EX GEL: 4.0000 g | Freq: Four times a day (QID) | CUTANEOUS | 0 refills | Status: DC

## 2019-09-25 MED ORDER — TRAMADOL HCL 50 MG PO TABS: 50.0000 mg | ORAL_TABLET | Freq: Four times a day (QID) | ORAL | 0 refills | Status: DC | PRN

## 2019-09-25 NOTE — ED Notes (Signed)
See triage note, pt with left ankle swelling x1 week, sent to ED from PCP.  Alert and oriented, clear speech

## 2019-09-25 NOTE — ED Triage Notes (Signed)
Pt has had left ankle swelling with palpable knot. Sent from PCP for r/o DVT. No hx same. Ambulatory, VSS

## 2019-09-25 NOTE — ED Provider Notes (Signed)
[facility] Emergency Department Provider Note ____________________________________________   First MD Initiated Contact with Patient 09/25/19 2114     (approximate)  I have reviewed the triage vital signs and the nursing notes.   HISTORY  Chief Complaint Leg Swelling  HPI Jillian [name] is a 69 y.o. female with history of neuropathy, venous insufficiency, and other medical history as listed below presents to the emergency department for treatment and evaluation of pain in her left lower extremity with swelling and palpable knot that has been present for the past week.  She was advised to come to the emergency department by her PCP.  No previous history of DVT.         Past Medical History:  Diagnosis Date  . Allergy    seasonal rhinitis  . Arthritis    right shoulder,  no intervention  . Cancer ([place])    basal cell   . GERD (gastroesophageal reflux disease)   . Hyperlipidemia   . Hypertension   . Irritable bowel syndrome (IBS) Oct 2011   last colonoscopy showed diverticulum, no polyps  . Neuropathy    FEET  . Vertigo    OCCAS    Patient Active Problem List   Diagnosis Date Noted  . Osteopenia 09/14/2019  . Vaginitis and vulvovaginitis 08/24/2019  . Family history of breast cancer in first degree relative 03/06/2019  . Abnormal mammogram of right breast 02/27/2019  . Chronic right maxillary sinusitis 08/13/2018  . Fatigue 01/11/2018  . B12 deficiency 07/11/2017  . CKD (chronic kidney disease) stage 3, GFR 30-59 ml/min 07/11/2017  . Atherosclerosis of coronary artery 01/12/2017  . Hepatic steatosis 01/12/2017  . Atherosclerosis of aorta ([place]) 06/26/2016  . Leukorrhea, vaginal, noninfectious 12/29/2015  . Encounter for preventive health examination 12/29/2015  . Lactose intolerance 05/30/2015  . Gluten intolerance 05/30/2015  . Insomnia 03/30/2013  . Lumbago 03/30/2013  . Initial Medicare annual wellness visit 03/24/2012  .  Venous insufficiency of leg 10/23/2011  . Obesity 06/18/2011  . Screening for breast cancer 12/14/2010  . Screening for cervical cancer 12/14/2010  . GERD (gastroesophageal reflux disease) 12/14/2010  . Arthritis   . Hyperlipidemia   . Hypertension   . Allergic rhinitis due to pollen     Past Surgical History:  Procedure Laterality Date  . BREAST BIOPSY Right 03/12/2019   [place] bx neg  . CATARACT EXTRACTION W/PHACO Left 04/09/2017   Procedure: CATARACT EXTRACTION PHACO AND INTRAOCULAR LENS PLACEMENT (IOC);  Surgeon: Birder Robson, MD;  Location: [facility] ORS;  Service: Ophthalmology;  Laterality: Left;  [place] 00:29.6 AP% 18.4 CDE 5.44 FLUID PACK LOT # [id number]  . CATARACT EXTRACTION W/PHACO Right 04/30/2017   Procedure: CATARACT EXTRACTION PHACO AND INTRAOCULAR LENS PLACEMENT (IOC);  Surgeon: Birder Robson, MD;  Location: [facility] ORS;  Service: Ophthalmology;  Laterality: Right;  [place] 00:43.1 AP% 13.7 CDE 5.91 Fluid Pack Lot # [id number]  . right breast bx  03/12/2019  . TONSILLECTOMY AND ADENOIDECTOMY  1956  . TUBAL LIGATION  1977  . VEIN SURGERY      Prior to Admission medications   Medication Sig Start Date End Date Taking? Authorizing Provider  amLODipine (NORVASC) 2.5 MG tablet TAKE ONE TABLET BY MOUTH DAILY 04/20/19   Crecencio Mc, MD  aspirin 81 MG tablet Take 81 mg by mouth daily.      [provider]  cetirizine (ZYRTEC) 10 MG tablet Take 10 mg by mouth at bedtime.    [provider]  Cholecalciferol (  VITAMIN D3) 1000 UNITS CAPS Take 1,000 Units by mouth daily.     [provider]  conjugated estrogens (PREMARIN) vaginal cream Place 1 Applicatorful vaginally 3 (three) times a week. Apply daily for x 2 weeks, then decrease to 3 times weekly. 06/24/19   Rubie Maid, MD  cyanocobalamin (,VITAMIN B-12,) 1000 MCG/ML injection Inject 1,000 mcg into the muscle every 30 (thirty) days.    [provider]  diclofenac Sodium (VOLTAREN) 1 % GEL Apply 4 g  topically 4 (four) times daily. 09/25/19   Liane Tribbey B, FNP  famotidine (PEPCID) 10 MG tablet Take 10 mg by mouth daily. As needed    [provider]  Fluocinolone Acetonide 0.01 % OIL Apply 1 application topically See admin instructions. Apply to affected ear daily as needed for dermatisis 01/20/17   [provider]  fluticasone (FLONASE) 50 MCG/ACT nasal spray PLACE 2 SPRAYS INTO THE NOSE DAILY. 09/20/16   Crecencio Mc, MD  lactulose (CHRONULAC) 10 GM/15ML solution TAKE 30 MILLILITERS EVERY 4 HOURS UNTIL CONSTIPATION IS RELIEVED 07/01/17   Crecencio Mc, MD  NON FORMULARY Apply 1 application topically daily as needed (for dermatisis on face). Hydrocortisone 2.5%/Ketoconazole 2% Cream    [provider]  nystatin (MYCOSTATIN/NYSTOP) powder Apply topically 2 (two) times daily. To rash until resolved. 08/12/18   Crecencio Mc, MD  Prasterone (INTRAROSA) 6.5 MG INST Place 1 tablet vaginally at bedtime. 08/25/19   Rubie Maid, MD  Psyllium (METAMUCIL FIBER PO) Take by mouth. Take 1 tablet daily.    [provider]  simvastatin (ZOCOR) 40 MG tablet TAKE ONE TABLET BY MOUTH EVERY NIGHT AT BEDTIME 04/20/19   Crecencio Mc, MD  telmisartan (MICARDIS) 80 MG tablet TAKE ONE TABLET BY MOUTH EVERY EVENING 12/18/18   Crecencio Mc, MD  traMADol (ULTRAM) 50 MG tablet Take 1 tablet (50 mg total) by mouth every 6 (six) hours as needed. 09/25/19   Mirely Pangle, Johnette Abraham B, FNP    Allergies Doxycycline, Sulfa drugs cross reactors, and Tetanus toxoids  Family History  Problem Relation Age of Onset  . Hyperlipidemia Mother   . Hyperlipidemia Father   . Hypertension Father   . Hyperlipidemia Maternal Grandmother   . Hyperlipidemia Maternal Grandfather   . Cancer Cousin   . Breast cancer Sister 32    Social History Social History   Tobacco Use  . Smoking status: Former Smoker    Types: Cigarettes    Quit date: 03/16/1999    Years since quitting: 20.5  . Smokeless  tobacco: Never Used  Vaping Use  . Vaping Use: Never used  Substance Use Topics  . Alcohol use: No    Alcohol/week: 0.0 standard drinks  . Drug use: No    Review of Systems  Constitutional: No fever/chills Eyes: No visual changes. ENT: No sore throat. Cardiovascular: Denies chest pain. Respiratory: Denies shortness of breath. Gastrointestinal: No abdominal pain.  No nausea, no vomiting.  No diarrhea.  No constipation. Genitourinary: Negative for dysuria. Musculoskeletal: Positive for left lower extremity pain. Skin: Negative for rash. Neurological: Negative for headaches, focal weakness or numbness.  ____________________________________________   PHYSICAL EXAM:  VITAL SIGNS: ED Triage Vitals  Enc Vitals Group     BP 09/25/19 1602 119/78     Pulse Rate 09/25/19 1602 95     Resp 09/25/19 1602 18     Temp 09/25/19 1602 98.5 F (36.9 C)     Temp Source 09/25/19 1602 Oral  SpO2 09/25/19 1602 98 %     Weight 09/25/19 1558 (!) 205 lb (93 kg)     Height 09/25/19 1558 5\' 6"  (1.676 m)     Head Circumference --      Peak Flow --      Pain Score 09/25/19 1558 4     Pain Loc --      Pain Edu? --      Excl. in [place]? --     Constitutional: Alert and oriented. Well appearing and in no acute distress. Eyes: Conjunctivae are normal. PERRL. EOMI. Head: Atraumatic. Nose: No congestion/rhinnorhea. Mouth/Throat: Mucous membranes are moist.  Oropharynx non-erythematous. Neck: No stridor.   Hematological/Lymphatic/Immunilogical: No cervical lymphadenopathy. Cardiovascular: Normal rate, regular rhythm. Grossly normal heart sounds.  Good peripheral circulation. Respiratory: Normal respiratory effort.  No retractions. Lungs CTAB. Gastrointestinal: Soft and nontender. No distention. No abdominal bruits. No CVA tenderness. Genitourinary:  Musculoskeletal: Medial left ankle with palpable, superficial lesion with tenderness on palpation and diffuse swelling to right lower leg and  ankle. Neurologic:  Normal speech and language. No gross focal neurologic deficits are appreciated. No gait instability. Skin:  Skin is warm, dry and intact. No rash noted.  Psychiatric: Mood and affect are normal. Speech and behavior are normal.  ____________________________________________   LABS (all labs ordered are listed, but only abnormal results are displayed)  Labs Reviewed - No data to display ____________________________________________  EKG  Not indicated. ____________________________________________  RADIOLOGY  ED MD interpretation:    [place] negative for DVT. Thrombophlebitis of superficial vein present.  I, Sherrie George, personally viewed and evaluated these images (plain radiographs) as part of my medical decision making, as well as reviewing the written report by the radiologist.  Official radiology report(s): US Venous Img Lower Unilateral Left  Result Date: 09/25/2019 CLINICAL DATA:  LEFT leg swelling EXAM: LEFT LOWER EXTREMITY VENOUS DOPPLER ULTRASOUND TECHNIQUE: Gray-scale sonography with compression, as well as color and duplex ultrasound, were performed to evaluate the deep venous system(s) from the level of the common femoral vein through the popliteal and proximal calf veins. COMPARISON:  None FINDINGS: VENOUS Normal compressibility of the common femoral, superficial femoral, and popliteal veins, as well as the visualized calf veins. Visualized portions of profunda femoral vein and great saphenous vein unremarkable. No filling defects to suggest DVT on grayscale or color Doppler imaging. Doppler waveforms show normal direction of venous flow, normal respiratory plasticity and response to augmentation. Limited views of the contralateral common femoral vein are unremarkable. OTHER Palpable abnormality corresponds to a thrombosed superficial vein consistent with thrombophlebitis. Limitations: none IMPRESSION: No evidence of deep venous thrombosis in the LEFT lower  extremity. Superficial thrombophlebitis of the LEFT lower leg, corresponding to palpable finding. Electronically Signed   By: Lavonia Dana M.D.   On: 09/25/2019 17:13    ____________________________________________   PROCEDURES  Procedure(s) performed (including Critical Care):  Procedures  ____________________________________________   INITIAL IMPRESSION / ASSESSMENT AND PLAN     69 year old female presents to the emergency department to rule out DVT of her left lower extremity. See HPI for further details. No previous history of DVT. No recent immobilization or long travel distances. She does not smoke cigarettes. She does use Premarin.  DIFFERENTIAL DIAGNOSIS  DVT, Thrombophlebitis, skin lesion, cellulitis  ED COURSE  US shows superficial thrombophlebitis. She is already on ASA daily. She has stage III kidney disease, so NSAIDs are not recommended. She will be treated with Tramadol and Voltaren gel. She is also to  start wearing her compression stockings that she has at home. Follow up with PCP next week recommended. For concerns over the weekend, she is to return to the ER. ____________________________________________   FINAL CLINICAL IMPRESSION(S) / ED DIAGNOSES  Final diagnoses:  Thrombophlebitis     ED Discharge Orders         Ordered    traMADol (ULTRAM) 50 MG tablet  Every 6 hours PRN     Discontinue  Reprint     09/25/19 2138    diclofenac Sodium (VOLTAREN) 1 % GEL  4 times daily     Discontinue  Reprint     09/25/19 2138           Jillian [name] was evaluated in Emergency Department on 09/25/2019 for the symptoms described in the history of present illness. She was evaluated in the context of the global COVID-19 pandemic, which necessitated consideration that the patient might be at risk for infection with the SARS-CoV-2 virus that causes COVID-19. Institutional protocols and algorithms that pertain to the evaluation of patients at risk for COVID-19 are  in a state of rapid change based on information released by regulatory bodies including the CDC and federal and state organizations. These policies and algorithms were followed during the patient's care in the ED.   Note:  This document was prepared using Dragon voice recognition software and may include unintentional dictation errors.   Victorino Dike, FNP 09/25/19 2208    Nance Pear, MD 09/25/19 2209

## 2019-09-25 NOTE — Telephone Encounter (Signed)
Pt called and states that left leg has a knot in it. Warm to the touch and swollen ankle. Transferred to  to triage

## 2019-09-25 NOTE — Discharge Instructions (Signed)
Please wear your compression stocking daily.  Follow up with primary care next week.  Return to the ER for any symptom of concern if unable to see PCP.

## 2019-09-25 NOTE — Telephone Encounter (Signed)
Spoken to patient she stated she has a small knot on leg four inches from her ankle and her leg is very tender, swollen, and slight redness. She has had vein stripping in both legs in the past. She does have neuropathy that can cause some swelling but patient stated this was different and only in the left leg.Due to her SX, I suggested she go to ED to be evaluated.

## 2019-09-28 ENCOUNTER — Ambulatory Visit (INDEPENDENT_AMBULATORY_CARE_PROVIDER_SITE_OTHER): Payer: Medicare Other | Admitting: Internal Medicine

## 2019-09-28 ENCOUNTER — Encounter: Payer: Self-pay | Admitting: Internal Medicine

## 2019-09-28 ENCOUNTER — Other Ambulatory Visit: Payer: Self-pay

## 2019-09-28 DIAGNOSIS — N1831 Chronic kidney disease, stage 3a: Secondary | ICD-10-CM

## 2019-09-28 DIAGNOSIS — N6310 Unspecified lump in the right breast, unspecified quadrant: Secondary | ICD-10-CM

## 2019-09-28 DIAGNOSIS — N952 Postmenopausal atrophic vaginitis: Secondary | ICD-10-CM | POA: Diagnosis not present

## 2019-09-28 DIAGNOSIS — I10 Essential (primary) hypertension: Secondary | ICD-10-CM | POA: Diagnosis not present

## 2019-09-28 DIAGNOSIS — I80292 Phlebitis and thrombophlebitis of other deep vessels of left lower extremity: Secondary | ICD-10-CM | POA: Diagnosis not present

## 2019-09-28 MED ORDER — CEPHALEXIN 500 MG PO CAPS
500.0000 mg | ORAL_CAPSULE | Freq: Four times a day (QID) | ORAL | 0 refills | Status: DC
Start: 2019-09-28 — End: 2019-10-12

## 2019-09-28 NOTE — Progress Notes (Signed)
Pre visit review using our clinic review tool, if applicable. No additional management support is needed unless otherwise documented below in the visit note. 

## 2019-09-28 NOTE — Progress Notes (Signed)
Subjective:  Patient ID: Jillian [name], female    DOB: 1950-10-24  Age: 69 y.o. MRN: [id number]  CC: Diagnoses of Thrombophlebitis of left lower extremity ([place]), Breast mass, right, Atrophic vaginitis, and Stage 3a chronic kidney disease were pertinent to this visit.  HPI Jillian [name] presents for ER FOLLOW UP .  This visit occurred during the SARS-CoV-2 public health emergency.  Safety protocols were in place, including screening questions prior to the visit, additional usage of staff PPE, and extensive cleaning of exam room while observing appropriate contact time as indicated for disinfecting solutions.    Patient has received both doses of the [place] 19 vaccine without complications.  Patient continues to mask when outside of the home except when walking in yard or at safe distances from others .  Patient denies any change in mood or development of unhealthy behaviors resuting from the pandemic's restriction of activities and socialization.     Patient was evaluated in ER on July 30  Left lower leg swelling and tenderness.  She was diagnosed with  Thrombophlebitisafter   No DVT was seen on  Ultrasound.  She was discharged home with tramadol for  pain,  No antibiotics given .  Tried to give her advil which she refused due to CKD. Patient has  Venous insufficiency but no recent history of ablation. had it done to both legs years ago. Takes vaginal estrogen suppositories .  No history of DVT.   Only recent predisposing event was a mosquito bite to the area affected.    Wearing a compression stocking during the day.  Elevating leg at night.  She has noted persistent redness and warmth to the area distal to the knot.     Outpatient Medications Prior to Visit  Medication Sig Dispense Refill  . amLODipine (NORVASC) 2.5 MG tablet TAKE ONE TABLET BY MOUTH DAILY 90 tablet 1  . aspirin 81 MG tablet Take 81 mg by mouth daily.      . cetirizine (ZYRTEC) 10 MG tablet Take 10 mg by  mouth at bedtime.    . Cholecalciferol (VITAMIN D3) 1000 UNITS CAPS Take 1,000 Units by mouth daily.     Marland Kitchen conjugated estrogens (PREMARIN) vaginal cream Place 1 Applicatorful vaginally 3 (three) times a week. Apply daily for x 2 weeks, then decrease to 3 times weekly. 42.5 g 12  . cyanocobalamin (,VITAMIN B-12,) 1000 MCG/ML injection Inject 1,000 mcg into the muscle every 30 (thirty) days.    . diclofenac Sodium (VOLTAREN) 1 % GEL Apply 4 g topically 4 (four) times daily. 350 g 0  . famotidine (PEPCID) 10 MG tablet Take 10 mg by mouth daily. As needed    . Fluocinolone Acetonide 0.01 % OIL Apply 1 application topically See admin instructions. Apply to affected ear daily as needed for dermatisis    . fluticasone (FLONASE) 50 MCG/ACT nasal spray PLACE 2 SPRAYS INTO THE NOSE DAILY. 16 g 4  . lactulose (CHRONULAC) 10 GM/15ML solution TAKE 30 MILLILITERS EVERY 4 HOURS UNTIL CONSTIPATION IS RELIEVED 473 mL 2  . NON FORMULARY Apply 1 application topically daily as needed (for dermatisis on face). Hydrocortisone 2.5%/Ketoconazole 2% Cream    . nystatin (MYCOSTATIN/NYSTOP) powder Apply topically 2 (two) times daily. To rash until resolved. 15 g 0  . Prasterone (INTRAROSA) 6.5 MG INST Place 1 tablet vaginally at bedtime. 30 each 11  . Psyllium (METAMUCIL FIBER PO) Take by mouth. Take 1 tablet daily.    . simvastatin (ZOCOR) 40  MG tablet TAKE ONE TABLET BY MOUTH EVERY NIGHT AT BEDTIME 90 tablet 1  . telmisartan (MICARDIS) 80 MG tablet TAKE ONE TABLET BY MOUTH EVERY EVENING 30 tablet 0  . traMADol (ULTRAM) 50 MG tablet Take 1 tablet (50 mg total) by mouth every 6 (six) hours as needed. 12 tablet 0   No facility-administered medications prior to visit.    Review of Systems;  Patient denies headache, fevers, malaise, unintentional weight loss, skin rash, eye pain, sinus congestion and sinus pain, sore throat, dysphagia,  hemoptysis , cough, dyspnea, wheezing, chest pain, palpitations, orthopnea, edema,  abdominal pain, nausea, melena, diarrhea, constipation, flank pain, dysuria, hematuria, urinary  Frequency, nocturia, numbness, tingling, seizures,  Focal weakness, Loss of consciousness,  Tremor, insomnia, depression, anxiety, and suicidal ideation.      Objective:  BP 120/80   Pulse 75   Temp 98 F (36.7 C) (Oral)   Ht 5\' 6"  (1.676 m)   Wt 205 lb 6.4 oz (93.2 kg)   SpO2 94%   BMI 33.15 kg/m   BP Readings from Last 3 Encounters:  09/28/19 120/80  09/25/19 (!) 144/78  08/25/19 127/78    Wt Readings from Last 3 Encounters:  09/28/19 205 lb 6.4 oz (93.2 kg)  09/25/19 (!) 205 lb (93 kg)  08/25/19 206 lb 12.8 oz (93.8 kg)    General appearance: alert, cooperative and appears stated age Ears: normal TM's and external ear canals both ears Throat: lips, mucosa, and tongue normal; teeth and gums normal Neck: no adenopathy, no carotid bruit, supple, symmetrical, trachea midline and thyroid not enlarged, symmetric, no tenderness/mass/nodules Back: symmetric, no curvature. ROM normal. No CVA tenderness. Lungs: clear to auscultation bilaterally Heart: regular rate and rhythm, S1, S2 normal, no murmur, click, rub or gallop Abdomen: soft, non-tender; bowel sounds normal; no masses,  no organomegaly Pulses: 2+ and symmetric Skin: Left medial lower calf and proximal ankle warm,  Red.  Lymph nodes: Inguinal,  Cervical, supraclavicular, and axillary nodes normal.  Lab Results  Component Value Date   HGBA1C 5.6 01/09/2018   HGBA1C 5.6 07/09/2017   HGBA1C 5.7 01/09/2017    Lab Results  Component Value Date   CREATININE 1.16 08/24/2019   CREATININE 1.21 (H) 02/26/2019   CREATININE 1.16 09/16/2018    Lab Results  Component Value Date   WBC 8.4 02/26/2019   HGB 12.6 02/26/2019   HCT 38.6 02/26/2019   PLT 210.0 02/26/2019   GLUCOSE 92 08/24/2019   CHOL 161 08/24/2019   TRIG 176.0 (H) 08/24/2019   HDL 54.10 08/24/2019   LDLDIRECT 83.0 02/26/2019   LDLCALC 72 08/24/2019   ALT  13 08/24/2019   AST 18 08/24/2019   NA 139 08/24/2019   K 4.6 08/24/2019   CL 104 08/24/2019   CREATININE 1.16 08/24/2019   BUN 22 08/24/2019   CO2 26 08/24/2019   TSH 1.23 07/09/2017   HGBA1C 5.6 01/09/2018   MICROALBUR 1.1 08/24/2019    US Venous Img Lower Unilateral Left  Result Date: 09/25/2019 CLINICAL DATA:  LEFT leg swelling EXAM: LEFT LOWER EXTREMITY VENOUS DOPPLER ULTRASOUND TECHNIQUE: Gray-scale sonography with compression, as well as color and duplex ultrasound, were performed to evaluate the deep venous system(s) from the level of the common femoral vein through the popliteal and proximal calf veins. COMPARISON:  None FINDINGS: VENOUS Normal compressibility of the common femoral, superficial femoral, and popliteal veins, as well as the visualized calf veins. Visualized portions of profunda femoral vein and great saphenous vein unremarkable. No  filling defects to suggest DVT on grayscale or color Doppler imaging. Doppler waveforms show normal direction of venous flow, normal respiratory plasticity and response to augmentation. Limited views of the contralateral common femoral vein are unremarkable. OTHER Palpable abnormality corresponds to a thrombosed superficial vein consistent with thrombophlebitis. Limitations: none IMPRESSION: No evidence of deep venous thrombosis in the LEFT lower extremity. Superficial thrombophlebitis of the LEFT lower leg, corresponding to palpable finding. Electronically Signed   By: Lavonia Dana M.D.   On: 09/25/2019 17:13    Assessment & Plan:   Problem List Items Addressed This Visit      Unprioritized   Atrophic vaginitis    Diagnosed and treated with topical estrogen by Dr Marcelline Mates after referral for persistent leukorrhea      Breast mass, right    Biopsied in January, benign.  6 month follow up imaging was done in July  With no significant change.  6 month diagnostic bilateral mammogram recommended in January 2022 to document stability and bring  left breast screening up to speed       CKD (chronic kidney disease) stage 3, GFR 30-59 ml/min    Repeat labs show stability.  She is avoiding NSAIDs,  Nephrology follow up every 6 months   Lab Results  Component Value Date   CREATININE 1.16 08/24/2019         Thrombophlebitis of left lower extremity ([place])    Diagnosis made by ED on July 30.  Today's Exam more consistent with cellulitis.  Adding cephalexin .  F/u 2 weeks .  Sooner if symptoms do not improve.          I provided  30 minutes of  face-to-face time during this encounter reviewing patient's current problems and past ER visit , labs and imaging studies, providing counseling on the above mentioned problems , and coordination  of care .  I am having Krisna A. Elliston start on cephALEXin. I am also having her maintain her Vitamin D3, aspirin, fluticasone, famotidine, cetirizine, Fluocinolone Acetonide, NON FORMULARY, lactulose, nystatin, telmisartan, simvastatin, amLODipine, Premarin, Psyllium (METAMUCIL FIBER PO), cyanocobalamin, Intrarosa, traMADol, and diclofenac Sodium.  Meds ordered this encounter  Medications  . cephALEXin (KEFLEX) 500 MG capsule    Sig: Take 1 capsule (500 mg total) by mouth 4 (four) times daily.    Dispense:  28 capsule    Refill:  0    There are no discontinued medications.  Follow-up: Return in about 2 weeks (around 10/12/2019).   Crecencio Mc, MD

## 2019-09-28 NOTE — Patient Instructions (Addendum)
YOu can cut the tramadol in half   You can add up to 2000 mg of acetominophen (tylenol) every day safely  In divided doses (500 mg every 6 hours  Or 1000 mg every 12 hours.)  Adding cephalexin,  An antibiotic , to take 4 times daily for the next week .  Continue daily yogurt.

## 2019-09-29 DIAGNOSIS — I80292 Phlebitis and thrombophlebitis of other deep vessels of left lower extremity: Secondary | ICD-10-CM | POA: Insufficient documentation

## 2019-09-29 HISTORY — DX: Phlebitis and thrombophlebitis of other deep vessels of left lower extremity: I80.292

## 2019-09-29 NOTE — Assessment & Plan Note (Signed)
Diagnosis made by ED on July 30.  Today's Exam more consistent with cellulitis.  Adding cephalexin .  F/u 2 weeks .  Sooner if symptoms do not improve.

## 2019-09-29 NOTE — Assessment & Plan Note (Signed)
Biopsied in January, benign.  6 month follow up imaging was done in July  With no significant change.  6 month diagnostic bilateral mammogram recommended in January 2022 to document stability and bring left breast screening up to speed

## 2019-09-29 NOTE — Assessment & Plan Note (Signed)
Diagnosed and treated with topical estrogen by Dr Marcelline Mates after referral for persistent leukorrhea

## 2019-09-29 NOTE — Assessment & Plan Note (Signed)
Repeat labs show stability.  She is avoiding NSAIDs,  Nephrology follow up every 6 months   Lab Results  Component Value Date   CREATININE 1.16 08/24/2019

## 2019-10-12 ENCOUNTER — Other Ambulatory Visit: Payer: Self-pay

## 2019-10-12 ENCOUNTER — Ambulatory Visit (INDEPENDENT_AMBULATORY_CARE_PROVIDER_SITE_OTHER): Payer: Medicare Other | Admitting: Internal Medicine

## 2019-10-12 ENCOUNTER — Encounter: Payer: Self-pay | Admitting: Internal Medicine

## 2019-10-12 DIAGNOSIS — I80292 Phlebitis and thrombophlebitis of other deep vessels of left lower extremity: Secondary | ICD-10-CM

## 2019-10-12 DIAGNOSIS — I700 Atherosclerosis of aorta: Secondary | ICD-10-CM

## 2019-10-12 NOTE — Progress Notes (Signed)
Subjective:  Patient ID: Jillian [name], female    DOB: 09-Feb-1951  Age: 69 y.o. MRN: [id number]  CC: Diagnoses of Thrombophlebitis of left lower extremity ([place]) and Atherosclerosis of aorta ([place]) were pertinent to this visit.  HPI ARISHA GERVAIS presents for follow up on recent diagnosis of thrombophlebitis  This visit occurred during the SARS-CoV-2 public health emergency.  Safety protocols were in place, including screening questions prior to the visit, additional usage of staff PPE, and extensive cleaning of exam room while observing appropriate contact time as indicated for disinfecting solutions.     Patient is a 69 yr old female with a history of peripheral neuropathy and venous insufficiency with prior ablations who presented 2 weeks ago for ER follow up on diagnosis of thrombophlebitis. She was not treated with ax by Er  But was prescribed cephalexin  by me and tolerated the medication without diarrhea.  The redness and pain has resolved but she continues to have periodic gravity dependent swelling that is chronic.  She has decreased sensation but did note pain which had resolved  Outpatient Medications Prior to Visit  Medication Sig Dispense Refill  . amLODipine (NORVASC) 2.5 MG tablet TAKE ONE TABLET BY MOUTH DAILY 90 tablet 1  . aspirin 81 MG tablet Take 81 mg by mouth daily.      . cetirizine (ZYRTEC) 10 MG tablet Take 10 mg by mouth at bedtime.    . Cholecalciferol (VITAMIN D3) 1000 UNITS CAPS Take 1,000 Units by mouth daily.     Marland Kitchen conjugated estrogens (PREMARIN) vaginal cream Place 1 Applicatorful vaginally 3 (three) times a week. Apply daily for x 2 weeks, then decrease to 3 times weekly. 42.5 g 12  . cyanocobalamin (,VITAMIN B-12,) 1000 MCG/ML injection Inject 1,000 mcg into the muscle every 30 (thirty) days.    . diclofenac Sodium (VOLTAREN) 1 % GEL Apply 4 g topically 4 (four) times daily. 350 g 0  . famotidine (PEPCID) 10 MG tablet Take 10 mg by mouth daily. As  needed    . Fluocinolone Acetonide 0.01 % OIL Apply 1 application topically See admin instructions. Apply to affected ear daily as needed for dermatisis    . fluticasone (FLONASE) 50 MCG/ACT nasal spray PLACE 2 SPRAYS INTO THE NOSE DAILY. 16 g 4  . lactulose (CHRONULAC) 10 GM/15ML solution TAKE 30 MILLILITERS EVERY 4 HOURS UNTIL CONSTIPATION IS RELIEVED 473 mL 2  . NON FORMULARY Apply 1 application topically daily as needed (for dermatisis on face). Hydrocortisone 2.5%/Ketoconazole 2% Cream    . nystatin (MYCOSTATIN/NYSTOP) powder Apply topically 2 (two) times daily. To rash until resolved. 15 g 0  . Prasterone (INTRAROSA) 6.5 MG INST Place 1 tablet vaginally at bedtime. 30 each 11  . Psyllium (METAMUCIL FIBER PO) Take by mouth. Take 1 tablet daily.    . simvastatin (ZOCOR) 40 MG tablet TAKE ONE TABLET BY MOUTH EVERY NIGHT AT BEDTIME 90 tablet 1  . telmisartan (MICARDIS) 80 MG tablet TAKE ONE TABLET BY MOUTH EVERY EVENING 30 tablet 0  . traMADol (ULTRAM) 50 MG tablet Take 1 tablet (50 mg total) by mouth every 6 (six) hours as needed. 12 tablet 0  . cephALEXin (KEFLEX) 500 MG capsule Take 1 capsule (500 mg total) by mouth 4 (four) times daily. (Patient not taking: Reported on 10/12/2019) 28 capsule 0   No facility-administered medications prior to visit.    Review of Systems;  Patient denies headache, fevers, malaise, unintentional weight loss, skin rash, eye pain, sinus  congestion and sinus pain, sore throat, dysphagia,  hemoptysis , cough, dyspnea, wheezing, chest pain, palpitations, orthopnea, edema, abdominal pain, nausea, melena, diarrhea, constipation, flank pain, dysuria, hematuria, urinary  Frequency, nocturia, numbness, tingling, seizures,  Focal weakness, Loss of consciousness,  Tremor, insomnia, depression, anxiety, and suicidal ideation.      Objective:  BP 102/64 (BP Location: Left Arm, Patient Position: Sitting, Cuff Size: Normal)   Pulse 77   Temp 98.3 F (36.8 C) (Oral)    Resp 15   Ht 5\' 6"  (1.676 m)   Wt 207 lb 12.8 oz (94.3 kg)   SpO2 97%   BMI 33.54 kg/m   BP Readings from Last 3 Encounters:  10/12/19 102/64  09/28/19 120/80  09/25/19 (!) 144/78    Wt Readings from Last 3 Encounters:  10/12/19 207 lb 12.8 oz (94.3 kg)  09/28/19 205 lb 6.4 oz (93.2 kg)  09/25/19 (!) 205 lb (93 kg)    General appearance: alert, cooperative and appears stated age Ears: normal TM's and external ear canals both ears Throat: lips, mucosa, and tongue normal; teeth and gums normal Neck: no adenopathy, no carotid bruit, supple, symmetrical, trachea midline and thyroid not enlarged, symmetric, no tenderness/mass/nodules Back: symmetric, no curvature. ROM normal. No CVA tenderness. Lungs: clear to auscultation bilaterally Heart: regular rate and rhythm, S1, S2 normal, no murmur, click, rub or gallop Abdomen: soft, non-tender; bowel sounds normal; no masses,  no organomegaly Pulses: 2+ and symmetric Skin: Skin color, texture, turgor normal. No rashes or lesions Lymph nodes: Cervical, supraclavicular, and axillary nodes normal.  Lab Results  Component Value Date   HGBA1C 5.6 01/09/2018   HGBA1C 5.6 07/09/2017   HGBA1C 5.7 01/09/2017    Lab Results  Component Value Date   CREATININE 1.16 08/24/2019   CREATININE 1.21 (H) 02/26/2019   CREATININE 1.16 09/16/2018    Lab Results  Component Value Date   WBC 8.4 02/26/2019   HGB 12.6 02/26/2019   HCT 38.6 02/26/2019   PLT 210.0 02/26/2019   GLUCOSE 92 08/24/2019   CHOL 161 08/24/2019   TRIG 176.0 (H) 08/24/2019   HDL 54.10 08/24/2019   LDLDIRECT 83.0 02/26/2019   LDLCALC 72 08/24/2019   ALT 13 08/24/2019   AST 18 08/24/2019   NA 139 08/24/2019   K 4.6 08/24/2019   CL 104 08/24/2019   CREATININE 1.16 08/24/2019   BUN 22 08/24/2019   CO2 26 08/24/2019   TSH 1.23 07/09/2017   HGBA1C 5.6 01/09/2018   MICROALBUR 1.1 08/24/2019    US Venous Img Lower Unilateral Left  Result Date: 09/25/2019 CLINICAL  DATA:  LEFT leg swelling EXAM: LEFT LOWER EXTREMITY VENOUS DOPPLER ULTRASOUND TECHNIQUE: Gray-scale sonography with compression, as well as color and duplex ultrasound, were performed to evaluate the deep venous system(s) from the level of the common femoral vein through the popliteal and proximal calf veins. COMPARISON:  None FINDINGS: VENOUS Normal compressibility of the common femoral, superficial femoral, and popliteal veins, as well as the visualized calf veins. Visualized portions of profunda femoral vein and great saphenous vein unremarkable. No filling defects to suggest DVT on grayscale or color Doppler imaging. Doppler waveforms show normal direction of venous flow, normal respiratory plasticity and response to augmentation. Limited views of the contralateral common femoral vein are unremarkable. OTHER Palpable abnormality corresponds to a thrombosed superficial vein consistent with thrombophlebitis. Limitations: none IMPRESSION: No evidence of deep venous thrombosis in the LEFT lower extremity. Superficial thrombophlebitis of the LEFT lower leg, corresponding to palpable finding.  Electronically Signed   By: Lavonia Dana M.D.   On: 09/25/2019 17:13    Assessment & Plan:   Problem List Items Addressed This Visit      Unprioritized   Atherosclerosis of aorta [facility])    Discussed with patient in 2018 at time of diagnosis by CT.  She is taking a statin and asa and is at goal   Lab Results  Component Value Date   CHOL 161 08/24/2019   HDL 54.10 08/24/2019   LDLCALC 72 08/24/2019   LDLDIRECT 83.0 02/26/2019   TRIG 176.0 (H) 08/24/2019   CHOLHDL 3 08/24/2019         Thrombophlebitis of left lower extremity ([place])    Diagnosis made by ED on July 30.  At ER follow up her  Exam more consistent with cellulitis.  She has finished taking  Cephalexin and all acute symptoms have resolved.  She was instructed to call if redness or pain recurs and we will rescan         I provided  30 minutes of   face-to-face time during this encounter reviewing patient's current problems and past surgeries, labs and imaging studies, providing counseling on the above mentioned problems , and coordination  of care .  I have discontinued Belenda Cruise A. Dunigan's cephALEXin. I am also having her maintain her Vitamin D3, aspirin, fluticasone, famotidine, cetirizine, Fluocinolone Acetonide, NON FORMULARY, lactulose, nystatin, telmisartan, simvastatin, amLODipine, Premarin, Psyllium (METAMUCIL FIBER PO), cyanocobalamin, Intrarosa, traMADol, and diclofenac Sodium.  No orders of the defined types were placed in this encounter.   Medications Discontinued During This Encounter  Medication Reason  . cephALEXin (KEFLEX) 500 MG capsule Completed Course    Follow-up: No follow-ups on file.   Crecencio Mc, MD

## 2019-10-12 NOTE — Assessment & Plan Note (Signed)
Diagnosis made by ED on July 30.  At ER follow up her  Exam more consistent with cellulitis.  She has finished taking  Cephalexin and all acute symptoms have resolved.  She was instructed to call if redness or pain recurs and we will rescan

## 2019-10-12 NOTE — Assessment & Plan Note (Signed)
Discussed with patient in 2018 at time of diagnosis by CT.  She is taking a statin and asa and is at goal   Lab Results  Component Value Date   CHOL 161 08/24/2019   HDL 54.10 08/24/2019   LDLCALC 72 08/24/2019   LDLDIRECT 83.0 02/26/2019   TRIG 176.0 (H) 08/24/2019   CHOLHDL 3 08/24/2019

## 2019-10-14 ENCOUNTER — Other Ambulatory Visit: Payer: Self-pay | Admitting: Internal Medicine

## 2019-10-15 DIAGNOSIS — L218 Other seborrheic dermatitis: Secondary | ICD-10-CM | POA: Diagnosis not present

## 2019-10-15 DIAGNOSIS — L578 Other skin changes due to chronic exposure to nonionizing radiation: Secondary | ICD-10-CM | POA: Diagnosis not present

## 2019-10-15 DIAGNOSIS — Z85828 Personal history of other malignant neoplasm of skin: Secondary | ICD-10-CM | POA: Diagnosis not present

## 2019-10-28 ENCOUNTER — Ambulatory Visit (INDEPENDENT_AMBULATORY_CARE_PROVIDER_SITE_OTHER): Payer: Medicare Other

## 2019-10-28 ENCOUNTER — Other Ambulatory Visit: Payer: Self-pay

## 2019-10-28 DIAGNOSIS — E538 Deficiency of other specified B group vitamins: Secondary | ICD-10-CM | POA: Diagnosis not present

## 2019-10-28 MED ORDER — CYANOCOBALAMIN 1000 MCG/ML IJ SOLN
1000.0000 ug | Freq: Once | INTRAMUSCULAR | Status: AC
  Administered 2019-10-28: 1000 ug via INTRAMUSCULAR

## 2019-10-28 NOTE — Progress Notes (Signed)
Patient presented for B 12 injection to left deltoid, patient voiced no concerns nor showed any signs of distress during injection. 

## 2019-11-04 DIAGNOSIS — I80292 Phlebitis and thrombophlebitis of other deep vessels of left lower extremity: Secondary | ICD-10-CM

## 2019-11-13 ENCOUNTER — Ambulatory Visit (INDEPENDENT_AMBULATORY_CARE_PROVIDER_SITE_OTHER): Payer: Medicare Other

## 2019-11-13 ENCOUNTER — Other Ambulatory Visit: Payer: Self-pay

## 2019-11-13 DIAGNOSIS — I80292 Phlebitis and thrombophlebitis of other deep vessels of left lower extremity: Secondary | ICD-10-CM

## 2019-11-17 NOTE — Progress Notes (Signed)
There is No evidence of DVT or superficial thrombophlebitis by repeat US

## 2019-11-25 ENCOUNTER — Encounter: Payer: Medicare Other | Admitting: Obstetrics and Gynecology

## 2019-12-01 ENCOUNTER — Other Ambulatory Visit: Payer: Self-pay

## 2019-12-01 ENCOUNTER — Ambulatory Visit (INDEPENDENT_AMBULATORY_CARE_PROVIDER_SITE_OTHER): Payer: Medicare Other

## 2019-12-01 ENCOUNTER — Encounter: Payer: Self-pay | Admitting: Obstetrics and Gynecology

## 2019-12-01 ENCOUNTER — Ambulatory Visit (INDEPENDENT_AMBULATORY_CARE_PROVIDER_SITE_OTHER): Payer: Medicare Other | Admitting: Obstetrics and Gynecology

## 2019-12-01 VITALS — BP 136/87 | HR 76 | Ht 66.0 in | Wt 207.0 lb

## 2019-12-01 DIAGNOSIS — N952 Postmenopausal atrophic vaginitis: Secondary | ICD-10-CM

## 2019-12-01 DIAGNOSIS — E538 Deficiency of other specified B group vitamins: Secondary | ICD-10-CM

## 2019-12-01 DIAGNOSIS — Z23 Encounter for immunization: Secondary | ICD-10-CM

## 2019-12-01 MED ORDER — CYANOCOBALAMIN 1000 MCG/ML IJ SOLN
1000.0000 ug | Freq: Once | INTRAMUSCULAR | Status: AC
  Administered 2019-12-01: 1000 ug via INTRAMUSCULAR

## 2019-12-01 NOTE — Progress Notes (Signed)
Pt present for follow up exam for vaginal atrophy. Pt stated that she is doing well and have noticed a major improvement in her symptoms.

## 2019-12-01 NOTE — Progress Notes (Signed)
Patient presented for B 12 injection to right deltoid, patient voiced no concerns nor showed any signs of distress during injection. 

## 2019-12-01 NOTE — Progress Notes (Signed)
    GYNECOLOGY PROGRESS NOTE  Subjective:    Patient ID: [name], female    DOB: 08-Aug-1950, 69 y.o.   MRN: [id number]  HPI  Patient is a 69 y.o. G23P2002 female who presents for 3 month f/u of atrophic vaginitis.  She notes significant improvement of symptoms on the Premarin cream (had to change back from [place] due to insurance) and itching has stopped with the hydrocortisone cream use.   The following portions of the patient's history were reviewed and updated as appropriate: allergies, current medications, past family history, past medical history, past social history, past surgical history and problem list.  Review of Systems Pertinent items noted in HPI and remainder of comprehensive ROS otherwise negative.   Objective:   Blood pressure 136/87, pulse 76, height 5\' 6"  (1.676 m), weight 207 lb (93.9 kg). General appearance: alert and no distress Pelvis: external genitalia normal, rectovaginal septum normal.  Vagina without discharge, normal color.  Cervix normal appearing, no lesions and no tenderness.  Bimanual exam not performed.    Assessment:   Atrophic vaginitis  Plan:   - Patient has improved with use of Premarin cream for management of the atrophic vaginitis. Patient can continue use, twice weekly, 0.5 mg.  - To f/u in 1 year.     Rubie Maid, MD [facility]

## 2019-12-02 DIAGNOSIS — Z961 Presence of intraocular lens: Secondary | ICD-10-CM | POA: Diagnosis not present

## 2019-12-12 ENCOUNTER — Other Ambulatory Visit: Payer: Self-pay | Admitting: Internal Medicine

## 2020-01-05 ENCOUNTER — Other Ambulatory Visit: Payer: Self-pay

## 2020-01-05 ENCOUNTER — Ambulatory Visit (INDEPENDENT_AMBULATORY_CARE_PROVIDER_SITE_OTHER): Payer: Medicare Other

## 2020-01-05 DIAGNOSIS — E538 Deficiency of other specified B group vitamins: Secondary | ICD-10-CM

## 2020-01-05 MED ORDER — CYANOCOBALAMIN 1000 MCG/ML IJ SOLN
1000.0000 ug | Freq: Once | INTRAMUSCULAR | Status: AC
  Administered 2020-01-05: 1000 ug via INTRAMUSCULAR

## 2020-01-05 NOTE — Progress Notes (Addendum)
Patient presented for B 12 injection to left deltoid, patient voiced no concerns nor showed any signs of distress during injection.  Reviewed.  Dr Scott 

## 2020-01-10 ENCOUNTER — Other Ambulatory Visit: Payer: Self-pay | Admitting: Internal Medicine

## 2020-02-09 ENCOUNTER — Other Ambulatory Visit: Payer: Self-pay | Admitting: Internal Medicine

## 2020-02-09 ENCOUNTER — Ambulatory Visit (INDEPENDENT_AMBULATORY_CARE_PROVIDER_SITE_OTHER): Payer: Medicare Other

## 2020-02-09 ENCOUNTER — Other Ambulatory Visit: Payer: Self-pay

## 2020-02-09 DIAGNOSIS — E538 Deficiency of other specified B group vitamins: Secondary | ICD-10-CM | POA: Diagnosis not present

## 2020-02-09 MED ORDER — CYANOCOBALAMIN 1000 MCG/ML IJ SOLN
1000.0000 ug | Freq: Once | INTRAMUSCULAR | Status: AC
  Administered 2020-02-09: 1000 ug via INTRAMUSCULAR

## 2020-02-09 NOTE — Progress Notes (Signed)
Patient presented for B 12 injection to right deltoid, patient voiced no concerns nor showed any signs of distress during injection. 

## 2020-02-23 DIAGNOSIS — Z1231 Encounter for screening mammogram for malignant neoplasm of breast: Secondary | ICD-10-CM

## 2020-02-25 DIAGNOSIS — Z20822 Contact with and (suspected) exposure to covid-19: Secondary | ICD-10-CM | POA: Diagnosis not present

## 2020-02-25 DIAGNOSIS — N6310 Unspecified lump in the right breast, unspecified quadrant: Secondary | ICD-10-CM

## 2020-02-29 ENCOUNTER — Ambulatory Visit (INDEPENDENT_AMBULATORY_CARE_PROVIDER_SITE_OTHER): Payer: Medicare Other

## 2020-02-29 VITALS — Ht 66.0 in | Wt 207.0 lb

## 2020-02-29 DIAGNOSIS — Z0000 Encounter for general adult medical examination without abnormal findings: Secondary | ICD-10-CM

## 2020-02-29 NOTE — Progress Notes (Signed)
Subjective:   Jillian [name] is a 70 y.o. female who presents for Medicare Annual (Subsequent) preventive examination.  Review of Systems    No ROS.  Medicare Wellness Virtual Visit.          Objective:    Today's Vitals   02/29/20 0906  Weight: 207 lb (93.9 kg)  Height: 5\' 6"  (1.676 m)   Body mass index is 33.41 kg/m.  Advanced Directives 02/29/2020 09/25/2019 02/25/2019 02/21/2018 04/09/2017 02/14/2017 02/07/2016  Does Patient Have a Medical Advance Directive? Yes No Yes Yes Yes Yes Yes  Type of Advance Directive Living will - Healthcare Power of Attorney Living will;Healthcare Power of Attorney Living will;Healthcare Power of Attorney Living will;Healthcare Power of Attorney Living will  Does patient want to make changes to medical advance directive? No - Patient declined - No - Patient declined No - Patient declined No - Patient declined No - Patient declined No - Patient declined  Copy of Healthcare Power of Attorney in Chart? - - No - copy requested No - copy requested No - copy requested No - copy requested -    Current Medications (verified) Outpatient Encounter Medications as of 02/29/2020  Medication Sig  . amLODipine (NORVASC) 2.5 MG tablet TAKE ONE TABLET BY MOUTH DAILY  . aspirin 81 MG tablet Take 81 mg by mouth daily.    . cetirizine (ZYRTEC) 10 MG tablet Take 10 mg by mouth at bedtime.  . Cholecalciferol (VITAMIN D3) 1000 UNITS CAPS Take 1,000 Units by mouth daily.   04/28/2020 conjugated estrogens (PREMARIN) vaginal cream Place 1 Applicatorful vaginally 3 (three) times a week. Apply daily for x 2 weeks, then decrease to 3 times weekly. (Patient not taking: Reported on 12/01/2019)  . cyanocobalamin (,VITAMIN B-12,) 1000 MCG/ML injection Inject 1,000 mcg into the muscle every 30 (thirty) days.  . famotidine (PEPCID) 10 MG tablet Take 10 mg by mouth daily. As needed  . Fluocinolone Acetonide 0.01 % OIL Apply 1 application topically See admin instructions. Apply to affected  ear daily as needed for dermatisis  . fluticasone (FLONASE) 50 MCG/ACT nasal spray PLACE 2 SPRAYS INTO THE NOSE DAILY.  . NON FORMULARY Apply 1 application topically daily as needed (for dermatisis on face). Hydrocortisone 2.5%/Ketoconazole 2% Cream  . nystatin (MYCOSTATIN/NYSTOP) powder Apply topically 2 (two) times daily. To rash until resolved.  . Psyllium (METAMUCIL FIBER PO) Take by mouth. Take 1 tablet daily.  . simvastatin (ZOCOR) 40 MG tablet TAKE ONE TABLET BY MOUTH EVERY NIGHT AT BEDTIME  . telmisartan (MICARDIS) 80 MG tablet TAKE ONE TABLET BY MOUTH EVERY EVENING   No facility-administered encounter medications on file as of 02/29/2020.    Allergies (verified) Doxycycline, Sulfa drugs cross reactors, and Tetanus toxoids   History: Past Medical History:  Diagnosis Date  . Allergy    seasonal rhinitis  . Arthritis    right shoulder,  no intervention  . Cancer (HCC)    basal cell   . GERD (gastroesophageal reflux disease)   . Hyperlipidemia   . Hypertension   . Irritable bowel syndrome (IBS) Oct 2011   last colonoscopy showed diverticulum, no polyps  . Neuropathy    FEET  . Vertigo    OCCAS   Past Surgical History:  Procedure Laterality Date  . BREAST BIOPSY Right 03/12/2019   03/14/2019 bx neg  . CATARACT EXTRACTION W/PHACO Left 04/09/2017   Procedure: CATARACT EXTRACTION PHACO AND INTRAOCULAR LENS PLACEMENT (IOC);  Surgeon: 06/07/2017, MD;  Location: [facility] ORS;  Service: Ophthalmology;  Laterality: Left;  [place] 00:29.6 AP% 18.4 CDE 5.44 FLUID PACK LOT # [id number]  . CATARACT EXTRACTION W/PHACO Right 04/30/2017   Procedure: CATARACT EXTRACTION PHACO AND INTRAOCULAR LENS PLACEMENT (IOC);  Surgeon: Birder Robson, MD;  Location: [facility] ORS;  Service: Ophthalmology;  Laterality: Right;  [place] 00:43.1 AP% 13.7 CDE 5.91 Fluid Pack Lot # [id number]  . right breast bx  03/12/2019  . TONSILLECTOMY AND ADENOIDECTOMY  1956  . TUBAL LIGATION  1977  . VEIN SURGERY     Family History   Problem Relation Age of Onset  . Hyperlipidemia Mother   . Hyperlipidemia Father   . Hypertension Father   . Hyperlipidemia Maternal Grandmother   . Hyperlipidemia Maternal Grandfather   . Cancer Cousin   . Breast cancer Sister 6   Social History   Socioeconomic History  . Marital status: Divorced    Spouse name: Not on file  . Number of children: Not on file  . Years of education: Not on file  . Highest education level: Not on file  Occupational History  . Occupation: cutsodial    Employer: [employer]    Comment: works at [employer]  . Smoking status: Former Smoker    Types: Cigarettes    Quit date: 03/16/1999    Years since quitting: 20.9  . Smokeless tobacco: Never Used  Vaping Use  . Vaping Use: Never used  Substance and Sexual Activity  . Alcohol use: No    Alcohol/week: 0.0 standard drinks  . Drug use: No  . Sexual activity: Not Currently  Other Topics Concern  . Not on file  Social History Narrative  . Not on file   Social Determinants of Health   Financial Resource Strain: Low Risk   . Difficulty of Paying Living Expenses: Not hard at all  Food Insecurity: No Food Insecurity  . Worried About Charity fundraiser in the Last Year: Never true  . Ran Out of Food in the Last Year: Never true  Transportation Needs: No Transportation Needs  . Lack of Transportation (Medical): No  . Lack of Transportation (Non-Medical): No  Physical Activity: Not on file  Stress: No Stress Concern Present  . Feeling of Stress : Not at all  Social Connections: Unknown  . Frequency of Communication with Friends and Family: More than three times a week  . Frequency of Social Gatherings with Friends and Family: More than three times a week  . Attends Religious Services: Not on file  . Active Member of Clubs or Organizations: Not on file  . Attends Archivist Meetings: Not on file  . Marital Status: Not on file    Tobacco Counseling Counseling given: Not  Answered   Clinical Intake:  Pre-visit preparation completed: Yes        Diabetes: No  How often do you need to have someone help you when you read instructions, pamphlets, or other written materials from your doctor or pharmacy?: 1 - Never  Interpreter Needed?: No      Activities of Daily Living In your present state of health, do you have any difficulty performing the following activities: 02/29/2020  Hearing? N  Vision? N  Difficulty concentrating or making decisions? N  Walking or climbing stairs? N  Dressing or bathing? N  Doing errands, shopping? N  Preparing Food and eating ? N  Using the Toilet? N  In the past six months, have you accidently leaked urine? N  Do you have  problems with loss of bowel control? N  Managing your Medications? N  Managing your Finances? N  Housekeeping or managing your Housekeeping? N  Some recent data might be hidden    Patient Care Team: Crecencio Mc, MD as PCP - General (Internal Medicine)  Indicate any recent Medical Services you may have received from other than [facility] providers in the past year (date may be approximate).     Assessment:   This is a routine wellness examination for [facility].  I connected with Alleene today by telephone and verified that I am speaking with the correct person using two identifiers. Location patient: home Location provider: work Persons participating in the virtual visit: patient, Marine scientist.    I discussed the limitations, risks, security and privacy concerns of performing an evaluation and management service by telephone and the availability of in person appointments.The patient expressed understanding and verbally consented to this telephonic visit.    Interactive audio and video telecommunications were attempted between this provider and patient, however failed, due to patient having technical difficulties OR patient did not have access to video capability.  We continued and completed visit  with audio only.  Some vital signs may be absent or patient reported.   Hearing/Vision screen  Hearing Screening   125Hz  250Hz  500Hz  1000Hz  2000Hz  3000Hz  4000Hz  6000Hz  8000Hz   Right ear:           Left ear:           Comments: Patient is able to hear conversational tones without difficulty. No issues reported.  Vision Screening Comments: Followed by [facility] Wears corrective lenses  Cataract extraction, bilateral Virtual visit  Dietary issues and exercise activities discussed: Current Exercise Habits: Home exercise routine  Low carb/low sodium Good water initake  Goals      Patient Stated   .  Increase physical activity (pt-stated)      Use treadmill for exercise      Depression Screen PHQ 2/9 Scores 02/29/2020 02/25/2019 02/21/2018 02/14/2017 06/07/2016 02/07/2016 11/29/2014  PHQ - 2 Score 0 0 0 0 0 0 0  PHQ- 9 Score - - - 0 - - -    Fall Risk Fall Risk  02/29/2020 10/12/2019 09/28/2019 08/24/2019 02/25/2019  Falls in the past year? 0 0 0 0 0  Number falls in past yr: 0 - - - -  Injury with Fall? 0 - - - -  Follow up Falls evaluation completed Falls evaluation completed Falls evaluation completed Falls evaluation completed Falls evaluation completed    Vici: Handrails in use when climbing stairs? Yes Home free of loose throw rugs in walkways, pet beds, electrical cords, etc? Yes  Adequate lighting in your home to reduce risk of falls? Yes   ASSISTIVE DEVICES UTILIZED TO PREVENT FALLS: Use of a cane, walker or w/c? No  Grab bars in the bathroom? Yes  Shower chair or bench in shower? Yes  Elevated toilet seat or a handicapped toilet? Yes   TIMED UP AND GO: Was the test performed? No . Virtual visit.   Cognitive Function: MMSE - Mini Mental State Exam 02/14/2017 02/07/2016  Orientation to time 5 5  Orientation to Place 5 5  Registration 3 3  Attention/ Calculation 5 5  Recall 3 3  Language- name 2 objects 2 2   Language- repeat 1 1  Language- follow 3 step command 3 3  Language- read & follow direction 1 1  Write a sentence 1  1  Copy design 1 1  Total score 30 30     6CIT Screen 02/29/2020 02/25/2019 02/21/2018  What Year? 0 points 0 points 0 points  What month? 0 points 0 points 0 points  What time? 0 points 0 points 0 points  Count back from 20 0 points 0 points 0 points  Months in reverse 0 points 0 points 0 points  Repeat phrase - 0 points 0 points  Total Score - 0 0    Immunizations Immunization History  Administered Date(s) Administered  . Fluad Quad(high Dose 65+) 11/24/2018, 12/01/2019  . Hep A / Hep B 01/09/2017, 02/14/2017, 07/09/2017  . Influenza, High Dose Seasonal PF 11/29/2014, 12/27/2015, 11/20/2016  . Influenza,inj,quad, With Preservative 12/06/2017  . Influenza-Unspecified 11/18/2011, 11/10/2012, 11/25/2013, 12/04/2017  . Moderna Sars-Covid-2 Vaccination 04/07/2019, 05/05/2019, 12/26/2019  . Pneumococcal Conjugate-13 11/29/2014  . Pneumococcal Polysaccharide-23 02/29/2016  . Zoster Recombinat (Shingrix) 09/22/2018, 12/01/2018, 12/21/2018   Health Maintenance Health Maintenance  Topic Date Due  . MAMMOGRAM  08/26/2019  . Fecal DNA (Cologuard)  09/04/2022  . INFLUENZA VACCINE  Completed  . DEXA SCAN  Completed  . COVID-19 Vaccine  Completed  . Hepatitis C Screening  Completed  . PNA vac Low Risk Adult  Completed   Colorectal cancer screening: Type of screening: Cologuard. Completed 09/04/19. Repeat every 3 years  Mammogram status: Completed 02/25/19. Repeat every year. 3D Breast Bilateral ordered 02/23/20.   Bone Density status: Completed 09/10/19. Results reflect: Bone density results: OSTEOPENIA. Repeat every 2 years.  Lung Cancer Screening: (Low Dose CT Chest recommended if Age 11-80 years, 30 pack-year currently smoking OR have quit w/in 15years.) does not qualify.    Hepatitis C Screening: Completed 11/29/14.  Vision Screening: Recommended annual  ophthalmology exams for early detection of glaucoma and other disorders of the eye. Is the patient up to date with their annual eye exam?  Yes  Who is the provider or what is the name of the office in which the patient attends annual eye exams? [facility].  Dental Screening: Recommended annual dental exams for proper oral hygiene  Community Resource Referral / Chronic Care Management: CRR required this visit?  No   CCM required this visit?  No      Plan:   Keep all routine maintenance appointments.   Next scheduled lab 03/11/20 @ 9:15, B12 Injection  Follow up 03/25/20 @ 9:30  I have personally reviewed and noted the following in the patient's chart:   . Medical and social history . Use of alcohol, tobacco or illicit drugs  . Current medications and supplements . Functional ability and status . Nutritional status . Physical activity . Advanced directives . List of other physicians . Hospitalizations, surgeries, and ER visits in previous 12 months . Vitals . Screenings to include cognitive, depression, and falls . Referrals and appointments  In addition, I have reviewed and discussed with patient certain preventive protocols, quality metrics, and best practice recommendations. A written personalized care plan for preventive services as well as general preventive health recommendations were provided to patient via mychart.     Varney Biles, LPN   [id number]

## 2020-02-29 NOTE — Patient Instructions (Addendum)
Jillian [name] , Thank you for taking time to come for your Medicare Wellness Visit. I appreciate your ongoing commitment to your health goals. Please review the following plan we discussed and let me know if I can assist you in the future.   These are the goals we discussed: Goals      Patient Stated   .  Increase physical activity (pt-stated)      Use treadmill for exercise       This is a list of the screening recommended for you and due dates:  Health Maintenance  Topic Date Due  . Mammogram  08/26/2019  . Cologuard (Stool DNA test)  09/04/2022  . Flu Shot  Completed  . DEXA scan (bone density measurement)  Completed  . COVID-19 Vaccine  Completed  .  Hepatitis C: One time screening is recommended by Center for Disease Control  (CDC) for  adults born from [birth year] through 1966.   Completed  . Pneumonia vaccines  Completed    Immunizations Immunization History  Administered Date(s) Administered  . Fluad Quad(high Dose 65+) 11/24/2018, 12/01/2019  . Hep A / Hep B 01/09/2017, 02/14/2017, 07/09/2017  . Influenza, High Dose Seasonal PF 11/29/2014, 12/27/2015, 11/20/2016  . Influenza,inj,quad, With Preservative 12/06/2017  . Influenza-Unspecified 11/18/2011, 11/10/2012, 11/25/2013, 12/04/2017  . Moderna Sars-Covid-2 Vaccination 04/07/2019, 05/05/2019, 12/26/2019  . Pneumococcal Conjugate-13 11/29/2014  . Pneumococcal Polysaccharide-23 02/29/2016  . Zoster Recombinat (Shingrix) 09/22/2018, 12/01/2018, 12/21/2018   Keep all routine maintenance appointments.   Next scheduled lab 03/11/20 @ 9:15, B12 Injection  Follow up 03/25/20 @ 9:30  Advanced directives: End of life planning; Advance aging; Advanced directives discussed.  Copy of current HCPOA/Living Will requested.    Conditions/risks identified: none new.   Follow up in one year for your annual wellness visit.   Preventive Care 46 Years and Older, Female Preventive care refers to lifestyle choices and visits with your  health care provider that can promote health and wellness. What does preventive care include?  A yearly physical exam. This is also called an annual well check.  Dental exams once or twice a year.  Routine eye exams. Ask your health care provider how often you should have your eyes checked.  Personal lifestyle choices, including:  Daily care of your teeth and gums.  Regular physical activity.  Eating a healthy diet.  Avoiding tobacco and drug use.  Limiting alcohol use.  Practicing safe sex.  Taking low-dose aspirin every day.  Taking vitamin and mineral supplements as recommended by your health care provider. What happens during an annual well check? The services and screenings done by your health care provider during your annual well check will depend on your age, overall health, lifestyle risk factors, and family history of disease. Counseling  Your health care provider may ask you questions about your:  Alcohol use.  Tobacco use.  Drug use.  Emotional well-being.  Home and relationship well-being.  Sexual activity.  Eating habits.  History of falls.  Memory and ability to understand (cognition).  Work and work Statistician.  Reproductive health. Screening  You may have the following tests or measurements:  Height, weight, and BMI.  Blood pressure.  Lipid and cholesterol levels. These may be checked every 5 years, or more frequently if you are over 60 years old.  Skin check.  Lung cancer screening. You may have this screening every year starting at age 62 if you have a 30-pack-year history of smoking and currently smoke or have quit within the  past 15 years.  Fecal occult blood test (FOBT) of the stool. You may have this test every year starting at age 88.  Flexible sigmoidoscopy or colonoscopy. You may have a sigmoidoscopy every 5 years or a colonoscopy every 10 years starting at age 53.  Hepatitis C blood test.  Hepatitis B blood  test.  Sexually transmitted disease (STD) testing.  Diabetes screening. This is done by checking your blood sugar (glucose) after you have not eaten for a while (fasting). You may have this done every 1-3 years.  Bone density scan. This is done to screen for osteoporosis. You may have this done starting at age 31.  Mammogram. This may be done every 1-2 years. Talk to your health care provider about how often you should have regular mammograms. Talk with your health care provider about your test results, treatment options, and if necessary, the need for more tests. Vaccines  Your health care provider may recommend certain vaccines, such as:  Influenza vaccine. This is recommended every year.  Tetanus, diphtheria, and acellular pertussis (Tdap, Td) vaccine. You may need a Td booster every 10 years.  Zoster vaccine. You may need this after age 53.  Pneumococcal 13-valent conjugate (PCV13) vaccine. One dose is recommended after age 27.  Pneumococcal polysaccharide (PPSV23) vaccine. One dose is recommended after age 47. Talk to your health care provider about which screenings and vaccines you need and how often you need them. This information is not intended to replace advice given to you by your health care provider. Make sure you discuss any questions you have with your health care provider. Document Released: 03/11/2015 Document Revised: 11/02/2015 Document Reviewed: 12/14/2014 Elsevier Interactive Patient Education  2017 [employer].  Fall Prevention in the Home Falls can cause injuries. They can happen to people of all ages. There are many things you can do to make your home safe and to help prevent falls. What can I do on the outside of my home?  Regularly fix the edges of walkways and driveways and fix any cracks.  Remove anything that might make you trip as you walk through a door, such as a raised step or threshold.  Trim any bushes or trees on the path to your home.  Use  bright outdoor lighting.  Clear any walking paths of anything that might make someone trip, such as rocks or tools.  Regularly check to see if handrails are loose or broken. Make sure that both sides of any steps have handrails.  Any raised decks and porches should have guardrails on the edges.  Have any leaves, snow, or ice cleared regularly.  Use sand or salt on walking paths during winter.  Clean up any spills in your garage right away. This includes oil or grease spills. What can I do in the bathroom?  Use night lights.  Install grab bars by the toilet and in the tub and shower. Do not use towel bars as grab bars.  Use non-skid mats or decals in the tub or shower.  If you need to sit down in the shower, use a plastic, non-slip stool.  Keep the floor dry. Clean up any water that spills on the floor as soon as it happens.  Remove soap buildup in the tub or shower regularly.  Attach bath mats securely with double-sided non-slip rug tape.  Do not have throw rugs and other things on the floor that can make you trip. What can I do in the bedroom?  Use night lights.  Make sure that you have a light by your bed that is easy to reach.  Do not use any sheets or blankets that are too big for your bed. They should not hang down onto the floor.  Have a firm chair that has side arms. You can use this for support while you get dressed.  Do not have throw rugs and other things on the floor that can make you trip. What can I do in the kitchen?  Clean up any spills right away.  Avoid walking on wet floors.  Keep items that you use a lot in easy-to-reach places.  If you need to reach something above you, use a strong step stool that has a grab bar.  Keep electrical cords out of the way.  Do not use floor polish or wax that makes floors slippery. If you must use wax, use non-skid floor wax.  Do not have throw rugs and other things on the floor that can make you trip. What can  I do with my stairs?  Do not leave any items on the stairs.  Make sure that there are handrails on both sides of the stairs and use them. Fix handrails that are broken or loose. Make sure that handrails are as long as the stairways.  Check any carpeting to make sure that it is firmly attached to the stairs. Fix any carpet that is loose or worn.  Avoid having throw rugs at the top or bottom of the stairs. If you do have throw rugs, attach them to the floor with carpet tape.  Make sure that you have a light switch at the top of the stairs and the bottom of the stairs. If you do not have them, ask someone to add them for you. What else can I do to help prevent falls?  Wear shoes that:  Do not have high heels.  Have rubber bottoms.  Are comfortable and fit you well.  Are closed at the toe. Do not wear sandals.  If you use a stepladder:  Make sure that it is fully opened. Do not climb a closed stepladder.  Make sure that both sides of the stepladder are locked into place.  Ask someone to hold it for you, if possible.  Clearly mark and make sure that you can see:  Any grab bars or handrails.  First and last steps.  Where the edge of each step is.  Use tools that help you move around (mobility aids) if they are needed. These include:  Canes.  Walkers.  Scooters.  Crutches.  Turn on the lights when you go into a dark area. Replace any light bulbs as soon as they burn out.  Set up your furniture so you have a clear path. Avoid moving your furniture around.  If any of your floors are uneven, fix them.  If there are any pets around you, be aware of where they are.  Review your medicines with your doctor. Some medicines can make you feel dizzy. This can increase your chance of falling. Ask your doctor what other things that you can do to help prevent falls. This information is not intended to replace advice given to you by your health care provider. Make sure you  discuss any questions you have with your health care provider. Document Released: 12/09/2008 Document Revised: 07/21/2015 Document Reviewed: 03/19/2014 Elsevier Interactive Patient Education  2017 [employer].

## 2020-03-02 ENCOUNTER — Telehealth: Payer: Self-pay

## 2020-03-02 DIAGNOSIS — Z1231 Encounter for screening mammogram for malignant neoplasm of breast: Secondary | ICD-10-CM

## 2020-03-02 NOTE — Telephone Encounter (Signed)
I placed the corrected order

## 2020-03-02 NOTE — Telephone Encounter (Signed)
-----   Message from Karlyne Greenspan sent at 03/01/2020  2:11 PM EST ----- Regarding: Diag mammo Good afternoon!  Needing a corrected diag mammo order. IMG [id number]. Please and Thank you!

## 2020-03-10 ENCOUNTER — Other Ambulatory Visit: Payer: Self-pay

## 2020-03-10 ENCOUNTER — Telehealth (INDEPENDENT_AMBULATORY_CARE_PROVIDER_SITE_OTHER): Payer: Medicare Other | Admitting: Internal Medicine

## 2020-03-10 ENCOUNTER — Encounter: Payer: Self-pay | Admitting: Internal Medicine

## 2020-03-10 ENCOUNTER — Other Ambulatory Visit: Payer: Self-pay | Admitting: Internal Medicine

## 2020-03-10 VITALS — Ht 66.0 in | Wt 205.0 lb

## 2020-03-10 DIAGNOSIS — J329 Chronic sinusitis, unspecified: Secondary | ICD-10-CM | POA: Diagnosis not present

## 2020-03-10 DIAGNOSIS — J0191 Acute recurrent sinusitis, unspecified: Secondary | ICD-10-CM | POA: Diagnosis not present

## 2020-03-10 MED ORDER — PREDNISONE 10 MG PO TABS: ORAL_TABLET | ORAL | 0 refills | Status: DC

## 2020-03-10 MED ORDER — AZITHROMYCIN 250 MG PO TABS: ORAL_TABLET | ORAL | 0 refills | Status: DC

## 2020-03-10 NOTE — Progress Notes (Signed)
Virtual Visit via Video Note  I connected with Jillian [name]   on 03/10/20 at  3:45 PM EST by a video enabled telemedicine application and verified that I am speaking with the correct person using two identifiers.  Location patient: home, [place] Location provider:work or home office Persons participating in the virtual visit: patient, provider  I discussed the limitations of evaluation and management by telemedicine and the availability of in person appointments. The patient expressed understanding and agreed to proceed.   HPI: 1. 1 week had right sided sinus pressure around cheek noted with weather changes no fever, no sob, no cough does have +PND tried ns, flonase, zyrtec and sudafed PE. Grandson sick covid Xmas and she was tested 02/25/20 and negative  She had dental exam 02/29/20 and normal no issues   ROS: See pertinent positives and negatives per HPI.  Past Medical History:  Diagnosis Date  . Allergy    seasonal rhinitis  . Arthritis    right shoulder,  no intervention  . Cancer ([place])    basal cell   . GERD (gastroesophageal reflux disease)   . Hyperlipidemia   . Hypertension   . Irritable bowel syndrome (IBS) Oct 2011   last colonoscopy showed diverticulum, no polyps  . Neuropathy    FEET  . Vertigo    OCCAS    Past Surgical History:  Procedure Laterality Date  . BREAST BIOPSY Right 03/12/2019   [place] bx neg  . CATARACT EXTRACTION W/PHACO Left 04/09/2017   Procedure: CATARACT EXTRACTION PHACO AND INTRAOCULAR LENS PLACEMENT (IOC);  Surgeon: Birder Robson, MD;  Location: [facility];  Service: Ophthalmology;  Laterality: Left;  [place] 00:29.6 AP% 18.4 CDE 5.44 FLUID PACK LOT # [id number]  . CATARACT EXTRACTION W/PHACO Right 04/30/2017   Procedure: CATARACT EXTRACTION PHACO AND INTRAOCULAR LENS PLACEMENT (IOC);  Surgeon: Birder Robson, MD;  Location: [facility];  Service: Ophthalmology;  Laterality: Right;  [place] 00:43.1 AP% 13.7 CDE 5.91 Fluid Pack Lot # [id number]  . right  breast bx  03/12/2019  . TONSILLECTOMY AND ADENOIDECTOMY  1956  . TUBAL LIGATION  1977  . VEIN SURGERY       Current Outpatient Medications:  .  amLODipine (NORVASC) 2.5 MG tablet, TAKE ONE TABLET BY MOUTH DAILY, Disp: 90 tablet, Rfl: 1 .  aspirin 81 MG tablet, Take 81 mg by mouth daily., Disp: , Rfl:  .  azithromycin (ZITHROMAX) 250 MG tablet, 2 pills day 1 and 1 pill day 2-5 with food, Disp: 6 tablet, Rfl: 0 .  cetirizine (ZYRTEC) 10 MG tablet, Take 10 mg by mouth at bedtime., Disp: , Rfl:  .  Cholecalciferol (VITAMIN D3) 1000 UNITS CAPS, Take 1,000 Units by mouth daily., Disp: , Rfl:  .  conjugated estrogens (PREMARIN) vaginal cream, Place 1 Applicatorful vaginally 3 (three) times a week. Apply daily for x 2 weeks, then decrease to 3 times weekly., Disp: 42.5 g, Rfl: 12 .  cyanocobalamin (,VITAMIN B-12,) 1000 MCG/ML injection, Inject 1,000 mcg into the muscle every 30 (thirty) days., Disp: , Rfl:  .  famotidine (PEPCID) 10 MG tablet, Take 10 mg by mouth daily. As needed, Disp: , Rfl:  .  Fluocinolone Acetonide 0.01 % OIL, Apply 1 application topically See admin instructions. Apply to affected ear daily as needed for dermatisis, Disp: , Rfl:  .  fluticasone (FLONASE) 50 MCG/ACT nasal spray, PLACE 2 SPRAYS INTO THE NOSE DAILY., Disp: 16 g, Rfl: 4 .  NON FORMULARY, Apply 1 application topically daily as needed (  for dermatisis on face). Hydrocortisone 2.5%/Ketoconazole 2% Cream, Disp: , Rfl:  .  nystatin (MYCOSTATIN/NYSTOP) powder, Apply topically 2 (two) times daily. To rash until resolved., Disp: 15 g, Rfl: 0 .  predniSONE (DELTASONE) 10 MG tablet, 4 tablets x 3 days in the am then reduce by 2 pills x 3 days in the am then 1 pill in the am x 3 days  tablet daily until gone, Disp: 21 tablet, Rfl: 0 .  Psyllium (METAMUCIL FIBER PO), Take by mouth. Take 1 tablet daily., Disp: , Rfl:  .  simvastatin (ZOCOR) 40 MG tablet, TAKE ONE TABLET BY MOUTH EVERY NIGHT AT BEDTIME, Disp: 90 tablet, Rfl: 1 .   telmisartan (MICARDIS) 80 MG tablet, TAKE ONE TABLET BY MOUTH EVERY EVENING, Disp: 30 tablet, Rfl: 0  EXAM:  VITALS per patient if applicable:  GENERAL: alert, oriented, appears well and in no acute distress  HEENT: atraumatic, conjunttiva clear, no obvious abnormalities on inspection of external nose and ears  NECK: normal movements of the head and neck  LUNGS: on inspection no signs of respiratory distress, breathing rate appears normal, no obvious gross SOB, gasping or wheezing  CV: no obvious cyanosis  MS: moves all visible extremities without noticeable abnormality  PSYCH/NEURO: pleasant and cooperative, no obvious depression or anxiety, speech and thought processing grossly intact  ASSESSMENT AND PLAN:  Discussed the following assessment and plan:  Acute recurrent sinusitis, unspecified location - Plan: predniSONE (DELTASONE) 10 MG tablet, azithromycin (ZITHROMAX) 250 MG tablet, Ambulatory referral to ENT  Chronic sinusitis, unspecified location - Plan: predniSONE (DELTASONE) 10 MG tablet, Ambulatory referral to ENT  -we discussed possible serious and likely etiologies, options for evaluation and workup, limitations of telemedicine visit vs in person visit, treatment, treatment risks and precautions.   I discussed the assessment and treatment plan with the patient. The patient was provided an opportunity to ask questions and all were answered. The patient agreed with the plan and demonstrated an understanding of the instructions.    Time spent 20 min Delorise Jackson, MD

## 2020-03-10 NOTE — Progress Notes (Signed)
Sinus issues have been on and off with weather and allergies. Right side of the face worsened this last week. OTC medication and nose sprays not helping.   Right side of the face is sore and aches.

## 2020-03-11 ENCOUNTER — Ambulatory Visit: Payer: Medicare Other

## 2020-03-16 ENCOUNTER — Ambulatory Visit
Admission: RE | Admit: 2020-03-16 | Discharge: 2020-03-16 | Disposition: A | Discharge status: Home / Self Care | Payer: Medicare Other | Source: Ambulatory Visit | Attending: Internal Medicine | Admitting: Internal Medicine

## 2020-03-16 ENCOUNTER — Other Ambulatory Visit: Payer: Self-pay

## 2020-03-16 DIAGNOSIS — N6313 Unspecified lump in the right breast, lower outer quadrant: Secondary | ICD-10-CM | POA: Diagnosis not present

## 2020-03-16 DIAGNOSIS — Z1231 Encounter for screening mammogram for malignant neoplasm of breast: Secondary | ICD-10-CM | POA: Insufficient documentation

## 2020-03-16 DIAGNOSIS — N6315 Unspecified lump in the right breast, overlapping quadrants: Secondary | ICD-10-CM | POA: Insufficient documentation

## 2020-03-16 DIAGNOSIS — N6310 Unspecified lump in the right breast, unspecified quadrant: Secondary | ICD-10-CM | POA: Diagnosis present

## 2020-03-16 DIAGNOSIS — R928 Other abnormal and inconclusive findings on diagnostic imaging of breast: Secondary | ICD-10-CM | POA: Diagnosis not present

## 2020-03-16 DIAGNOSIS — N6311 Unspecified lump in the right breast, upper outer quadrant: Secondary | ICD-10-CM | POA: Diagnosis not present

## 2020-03-21 DIAGNOSIS — J301 Allergic rhinitis due to pollen: Secondary | ICD-10-CM | POA: Diagnosis not present

## 2020-03-21 DIAGNOSIS — J019 Acute sinusitis, unspecified: Secondary | ICD-10-CM | POA: Diagnosis not present

## 2020-03-25 ENCOUNTER — Ambulatory Visit (INDEPENDENT_AMBULATORY_CARE_PROVIDER_SITE_OTHER): Payer: Medicare Other

## 2020-03-25 ENCOUNTER — Ambulatory Visit: Payer: Medicare Other | Admitting: Internal Medicine

## 2020-03-25 ENCOUNTER — Other Ambulatory Visit: Payer: Self-pay

## 2020-03-25 DIAGNOSIS — E538 Deficiency of other specified B group vitamins: Secondary | ICD-10-CM | POA: Diagnosis not present

## 2020-03-25 MED ORDER — CYANOCOBALAMIN 1000 MCG/ML IJ SOLN
1000.0000 ug | Freq: Once | INTRAMUSCULAR | Status: AC
  Administered 2020-03-25: 1000 ug via INTRAMUSCULAR

## 2020-03-25 NOTE — Progress Notes (Signed)
Patient presented for B 12 injection to right deltoid, patient voiced no concerns nor showed any signs of distress during injection. 

## 2020-04-05 ENCOUNTER — Other Ambulatory Visit: Payer: Self-pay | Admitting: Internal Medicine

## 2020-04-19 DIAGNOSIS — J301 Allergic rhinitis due to pollen: Secondary | ICD-10-CM | POA: Diagnosis not present

## 2020-04-20 ENCOUNTER — Encounter: Payer: Self-pay | Admitting: Internal Medicine

## 2020-04-20 ENCOUNTER — Other Ambulatory Visit: Payer: Self-pay

## 2020-04-20 ENCOUNTER — Ambulatory Visit (INDEPENDENT_AMBULATORY_CARE_PROVIDER_SITE_OTHER): Payer: Medicare Other | Admitting: Internal Medicine

## 2020-04-20 VITALS — BP 136/82 | HR 78 | Temp 97.9°F | Ht 65.98 in | Wt 209.4 lb

## 2020-04-20 DIAGNOSIS — I10 Essential (primary) hypertension: Secondary | ICD-10-CM | POA: Diagnosis not present

## 2020-04-20 DIAGNOSIS — K760 Fatty (change of) liver, not elsewhere classified: Secondary | ICD-10-CM | POA: Diagnosis not present

## 2020-04-20 DIAGNOSIS — R920 Mammographic microcalcification found on diagnostic imaging of breast: Secondary | ICD-10-CM | POA: Diagnosis not present

## 2020-04-20 DIAGNOSIS — I2510 Atherosclerotic heart disease of native coronary artery without angina pectoris: Secondary | ICD-10-CM

## 2020-04-20 DIAGNOSIS — R7303 Prediabetes: Secondary | ICD-10-CM

## 2020-04-20 DIAGNOSIS — I700 Atherosclerosis of aorta: Secondary | ICD-10-CM | POA: Diagnosis not present

## 2020-04-20 DIAGNOSIS — R1011 Right upper quadrant pain: Secondary | ICD-10-CM | POA: Diagnosis not present

## 2020-04-20 DIAGNOSIS — L659 Nonscarring hair loss, unspecified: Secondary | ICD-10-CM | POA: Diagnosis not present

## 2020-04-20 DIAGNOSIS — L650 Telogen effluvium: Secondary | ICD-10-CM

## 2020-04-20 DIAGNOSIS — R7301 Impaired fasting glucose: Secondary | ICD-10-CM | POA: Diagnosis not present

## 2020-04-20 DIAGNOSIS — R1012 Left upper quadrant pain: Secondary | ICD-10-CM | POA: Diagnosis not present

## 2020-04-20 NOTE — Patient Instructions (Signed)
Fatty Liver Disease  The liver converts food into energy, removes toxic material from the blood, makes important proteins, and absorbs necessary vitamins from food. Fatty liver disease occurs when too much fat has built up in your liver cells. Fatty liver disease is also called hepatic steatosis. In many cases, fatty liver disease does not cause symptoms or problems. It is often diagnosed when tests are being done for other reasons. However, over time, fatty liver can cause inflammation that may lead to more serious liver problems, such as scarring of the liver (cirrhosis) and liver failure. Fatty liver is associated with insulin resistance, increased body fat, high blood pressure (hypertension), and high cholesterol. These are features of metabolic syndrome and increase your risk for stroke, diabetes, and heart disease. What are the causes? This condition may be caused by components of metabolic syndrome:  Obesity.  Insulin resistance.  High cholesterol. Other causes:  Alcohol abuse.  Poor nutrition.  Cushing syndrome.  Pregnancy.  Certain drugs.  Poisons.  Some viral infections. What increases the risk? You are more likely to develop this condition if you:  Abuse alcohol.  Are overweight.  Have diabetes.  Have hepatitis.  Have a high triglyceride level.  Are pregnant. What are the signs or symptoms? Fatty liver disease often does not cause symptoms. If symptoms do develop, they can include:  Fatigue and weakness.  Weight loss.  Confusion.  Nausea, vomiting, or abdominal pain.  Yellowing of your skin and the white parts of your eyes (jaundice).  Itchy skin. How is this diagnosed? This condition may be diagnosed by:  A physical exam and your medical history.  Blood tests.  Imaging tests, such as an ultrasound, CT scan, or MRI.  A liver biopsy. A small sample of liver tissue is removed using a needle. The sample is then looked at under a  microscope. How is this treated? Fatty liver disease is often caused by other health conditions. Treatment for fatty liver may involve medicines and lifestyle changes to manage conditions such as:  Alcoholism.  High cholesterol.  Diabetes.  Being overweight or obese. Follow these instructions at home:  Do not drink alcohol. If you have trouble quitting, ask your health care provider how to safely quit with the help of medicine or a supervised program. This is important to keep your condition from getting worse.  Eat a healthy diet as told by your health care provider. Ask your health care provider about working with a dietitian to develop an eating plan.  Exercise regularly. This can help you lose weight and control your cholesterol and diabetes. Talk to your health care provider about an exercise plan and which activities are best for you.  Take over-the-counter and prescription medicines only as told by your health care provider.  Keep all follow-up visits. This is important.   Contact a health care provider if:  You have trouble controlling your: ? Blood sugar. This is especially important if you have diabetes. ? Cholesterol. ? Drinking of alcohol. Get help right away if:  You have abdominal pain.  You have jaundice.  You have nausea and are vomiting.  You vomit blood or material that looks like coffee grounds.  You have stools that are black, tar-like, or bloody. Summary  Fatty liver disease develops when too much fat builds up in the cells of your liver.  Fatty liver disease often causes no symptoms or problems. However, over time, fatty liver can cause inflammation that may lead to more serious liver   problems, such as scarring of the liver (cirrhosis).  You are more likely to develop this condition if you abuse alcohol, are pregnant, are overweight, have diabetes, have hepatitis, or have high triglyceride or cholesterol levels.  Contact your health care provider  if you have trouble controlling your blood sugar, cholesterol, or drinking of alcohol. This information is not intended to replace advice given to you by your health care provider. Make sure you discuss any questions you have with your health care provider. Document Revised: 11/26/2019 Document Reviewed: 11/26/2019 Elsevier Patient Education  2021 [employer]  

## 2020-04-20 NOTE — Assessment & Plan Note (Signed)
Decreased size suggesting benign nature  Follow up with diagnositic mammogram and UA in Jan 2023/

## 2020-04-20 NOTE — Assessment & Plan Note (Addendum)
Reviewed findings of prior CT scan today..  Patient is tolerating high potency statin therapy and LDL is at goal  Lab Results  Component Value Date   CHOL 161 08/24/2019   HDL 54.10 08/24/2019   LDLCALC 72 08/24/2019   LDLDIRECT 83.0 02/26/2019   TRIG 176.0 (H) 08/24/2019   CHOLHDL 3 08/24/2019

## 2020-04-20 NOTE — Progress Notes (Signed)
Subjective:  Patient ID: Jillian [name], female    DOB: 1950/07/21  Age: 70 y.o. MRN: [id number]  CC: The primary encounter diagnosis was Fatty liver. Diagnoses of Abnormal mammogram with microcalcification, Atherosclerosis of aorta ([place]), Left upper quadrant abdominal pain, Hair loss, Impaired fasting glucose, Right upper quadrant pain, Hepatic steatosis, Atherosclerosis of native coronary artery of native heart without angina pectoris, Telogen hair loss, and Prediabetes were also pertinent to this visit.  HPI Jillian [name] presents for follow up on  Chronic issues as well as Subacute recurrent right sided pain.   This visit occurred during the SARS-CoV-2 public health emergency.  Safety protocols were in place, including screening questions prior to the visit, additional usage of staff PPE, and extensive cleaning of exam room while observing appropriate contact time as indicated for disinfecting solutions.   patiint had been having discomfort under the right ribcage in the liver area constantly for a month from Oct 31 to Nov 31 which coincided with the day she received the COVID booster) .  She reports that she had  fevers and chills for 5 days  Following the COVID  booster.  ruq pain did not resolve change to bland diet as it had in the past.  Bowels were excessively gassy. Stools were solid,  No change in color,  No  nausea  No anorexia . The pain is now intermittent and not related to eating but has to avoid salad for concurrent diagnosis of  IBS, lactose and gluten intolerance.  Ongoing  hair loss.   covid test was negative in January after a known exposure .  She was recently  treated for right maxillary sinusitis with 2 rounds of abx plus prednisone.   Allergy testing was done yesterday   Aortic atherosclerosis : noted on previous imaging study: reviewed findings of prior CT scan today..  Patient has beeing taking simvastatin since her 30's due to [place] of CAD.  Hypertension:  patient checks blood pressure twice weekly at home.  Readings have been for the most part < 140/80 at rest . Patient is following a reduce salt diet most days and is taking medications as prescribed  Outpatient Medications Prior to Visit  Medication Sig Dispense Refill  . amLODipine (NORVASC) 2.5 MG tablet TAKE ONE TABLET BY MOUTH DAILY 90 tablet 1  . aspirin 81 MG tablet Take 81 mg by mouth daily.    Marland Kitchen azithromycin (ZITHROMAX) 250 MG tablet 2 pills day 1 and 1 pill day 2-5 with food 6 tablet 0  . cetirizine (ZYRTEC) 10 MG tablet Take 10 mg by mouth at bedtime.    . Cholecalciferol (VITAMIN D3) 1000 UNITS CAPS Take 1,000 Units by mouth daily.    Marland Kitchen conjugated estrogens (PREMARIN) vaginal cream Place 1 Applicatorful vaginally 3 (three) times a week. Apply daily for x 2 weeks, then decrease to 3 times weekly. 42.5 g 12  . cyanocobalamin (,VITAMIN B-12,) 1000 MCG/ML injection Inject 1,000 mcg into the muscle every 30 (thirty) days.    . famotidine (PEPCID) 10 MG tablet Take 10 mg by mouth daily. As needed    . Fluocinolone Acetonide 0.01 % OIL Apply 1 application topically See admin instructions. Apply to affected ear daily as needed for dermatisis    . fluticasone (FLONASE) 50 MCG/ACT nasal spray PLACE 2 SPRAYS INTO THE NOSE DAILY. 16 g 4  . NON FORMULARY Apply 1 application topically daily as needed (for dermatisis on face). Hydrocortisone 2.5%/Ketoconazole 2% Cream    .  nystatin (MYCOSTATIN/NYSTOP) powder Apply topically 2 (two) times daily. To rash until resolved. 15 g 0  . predniSONE (DELTASONE) 10 MG tablet 4 tablets x 3 days in the am then reduce by 2 pills x 3 days in the am then 1 pill in the am x 3 days  tablet daily until gone 21 tablet 0  . Psyllium (METAMUCIL FIBER PO) Take by mouth. Take 1 tablet daily.    . simvastatin (ZOCOR) 40 MG tablet TAKE ONE TABLET BY MOUTH EVERY NIGHT AT BEDTIME 90 tablet 1  . telmisartan (MICARDIS) 80 MG tablet TAKE ONE TABLET BY MOUTH EVERY EVENING 30 tablet 0    No facility-administered medications prior to visit.    Review of Systems;  Patient denies headache, fevers, malaise, unintentional weight loss, skin rash, eye pain, sinus congestion and sinus pain, sore throat, dysphagia,  hemoptysis , cough, dyspnea, wheezing, chest pain, palpitations, orthopnea, edema, abdominal pain, nausea, melena, diarrhea, constipation, flank pain, dysuria, hematuria, urinary  Frequency, nocturia, numbness, tingling, seizures,  Focal weakness, Loss of consciousness,  Tremor, insomnia, depression, anxiety, and suicidal ideation.      Objective:  BP 136/82 (BP Location: Left Arm, Patient Position: Sitting)   Pulse 78   Temp 97.9 F (36.6 C)   Ht 5' 5.98" (1.676 m)   Wt 209 lb 6.4 oz (95 kg)   SpO2 96%   BMI 33.81 kg/m   BP Readings from Last 3 Encounters:  04/20/20 136/82  12/01/19 136/87  10/12/19 102/64    Wt Readings from Last 3 Encounters:  04/20/20 209 lb 6.4 oz (95 kg)  03/10/20 205 lb (93 kg)  02/29/20 207 lb (93.9 kg)    General appearance: alert, cooperative and appears stated age Ears: normal TM's and external ear canals both ears Throat: lips, mucosa, and tongue normal; teeth and gums normal Neck: no adenopathy, no carotid bruit, supple, symmetrical, trachea midline and thyroid not enlarged, symmetric, no tenderness/mass/nodules Back: symmetric, no curvature. ROM normal. No CVA tenderness. Lungs: clear to auscultation bilaterally Heart: regular rate and rhythm, S1, S2 normal, no murmur, click, rub or gallop Abdomen: soft, tender to deep palpation of epigastric area and  RUQ ,  bowel sounds normal; no masses,  no organomegaly Pulses: 2+ and symmetric Skin: Skin color, texture, turgor normal. No rashes or lesions Lymph nodes: Cervical, supraclavicular, and axillary nodes normal.  Lab Results  Component Value Date   HGBA1C 5.8 04/20/2020   HGBA1C 5.6 01/09/2018   HGBA1C 5.6 07/09/2017    Lab Results  Component Value Date    CREATININE 1.09 04/20/2020   CREATININE 1.16 08/24/2019   CREATININE 1.21 (H) 02/26/2019    Lab Results  Component Value Date   WBC 8.4 02/26/2019   HGB 12.6 02/26/2019   HCT 38.6 02/26/2019   PLT 210.0 02/26/2019   GLUCOSE 79 04/20/2020   CHOL 161 08/24/2019   TRIG 176.0 (H) 08/24/2019   HDL 54.10 08/24/2019   LDLDIRECT 83.0 02/26/2019   LDLCALC 72 08/24/2019   ALT 13 04/20/2020   AST 17 04/20/2020   NA 141 04/20/2020   K 4.7 04/20/2020   CL 105 04/20/2020   CREATININE 1.09 04/20/2020   BUN 18 04/20/2020   CO2 26 04/20/2020   TSH 0.93 04/20/2020   HGBA1C 5.8 04/20/2020   MICROALBUR 1.1 08/24/2019    US BREAST LTD UNI RIGHT INC AXILLA  Result Date: 03/16/2020 CLINICAL DATA:  Follow-up for probably benign mass in the RIGHT breast at the 9 o'clock axis. Status  post benign ultrasound-guided biopsy of a RIGHT breast mass at the 10 o'clock axis in January of 2021. EXAM: DIGITAL DIAGNOSTIC BILATERAL MAMMOGRAM WITH CAD AND TOMOSYNTHESIS ULTRASOUND BREAST RIGHT TECHNIQUE: Bilateral digital diagnostic mammography and breast tomosynthesis was performed. Digital images of the bilateral breasts were evaluated with computer-aided detection. Targeted ultrasound examination of the right breast was performed. COMPARISON:  Previous exams including diagnostic mammogram and ultrasound dated 09/10/2019 ACR Breast Density Category b: There are scattered areas of fibroglandular density. FINDINGS: Biopsy site marker within the outer RIGHT breast is stable in position, corresponding to a previous benign biopsy site. Adjacent low-density mass within the outer RIGHT breast is stable. There are no new dominant masses, suspicious calcifications or secondary signs of malignancy within either breast. Targeted ultrasound is performed, again showing the oval circumscribed mass within the RIGHT breast at the 9 o'clock axis, 4 cm from the nipple, again most suggestive of a benign cyst or cluster of cysts, measuring 6  x 3 x 4 mm, slightly decreased in size compared to the previous studies suggesting benignity. IMPRESSION: 1. Probably benign cyst or cluster of cysts within the RIGHT breast at the 9 o'clock axis, 4 cm from the nipple, measuring 6 x 3 x 4 mm, slightly decreased in size compared to previous ultrasound suggesting benignity. Recommend additional follow-up diagnostic mammogram and ultrasound in 12 months to ensure 2 year stability. 2. No evidence of malignancy within the LEFT breast. RECOMMENDATION: Bilateral diagnostic mammogram, and RIGHT breast ultrasound, in 12 months. I have discussed the findings and recommendations with the patient. If applicable, a reminder letter will be sent to the patient regarding the next appointment. BI-RADS CATEGORY  3: Probably benign. Electronically Signed   By: Franki Cabot M.D.   On: 03/16/2020 12:27   MM DIAG BREAST TOMO BILATERAL  Result Date: 03/16/2020 CLINICAL DATA:  Follow-up for probably benign mass in the RIGHT breast at the 9 o'clock axis. Status post benign ultrasound-guided biopsy of a RIGHT breast mass at the 10 o'clock axis in January of 2021. EXAM: DIGITAL DIAGNOSTIC BILATERAL MAMMOGRAM WITH CAD AND TOMOSYNTHESIS ULTRASOUND BREAST RIGHT TECHNIQUE: Bilateral digital diagnostic mammography and breast tomosynthesis was performed. Digital images of the bilateral breasts were evaluated with computer-aided detection. Targeted ultrasound examination of the right breast was performed. COMPARISON:  Previous exams including diagnostic mammogram and ultrasound dated 09/10/2019 ACR Breast Density Category b: There are scattered areas of fibroglandular density. FINDINGS: Biopsy site marker within the outer RIGHT breast is stable in position, corresponding to a previous benign biopsy site. Adjacent low-density mass within the outer RIGHT breast is stable. There are no new dominant masses, suspicious calcifications or secondary signs of malignancy within either breast. Targeted  ultrasound is performed, again showing the oval circumscribed mass within the RIGHT breast at the 9 o'clock axis, 4 cm from the nipple, again most suggestive of a benign cyst or cluster of cysts, measuring 6 x 3 x 4 mm, slightly decreased in size compared to the previous studies suggesting benignity. IMPRESSION: 1. Probably benign cyst or cluster of cysts within the RIGHT breast at the 9 o'clock axis, 4 cm from the nipple, measuring 6 x 3 x 4 mm, slightly decreased in size compared to previous ultrasound suggesting benignity. Recommend additional follow-up diagnostic mammogram and ultrasound in 12 months to ensure 2 year stability. 2. No evidence of malignancy within the LEFT breast. RECOMMENDATION: Bilateral diagnostic mammogram, and RIGHT breast ultrasound, in 12 months. I have discussed the findings and recommendations with the patient.  If applicable, a reminder letter will be sent to the patient regarding the next appointment. BI-RADS CATEGORY  3: Probably benign. Electronically Signed   By: Franki Cabot M.D.   On: 03/16/2020 12:27    Assessment & Plan:   Problem List Items Addressed This Visit      Unprioritized   Telogen hair loss    Likely due to stress of vaccination or undiagnosed infection.  TSH is normal   Lab Results  Component Value Date   TSH 0.93 04/20/2020         Right upper quadrant pain    Etiology unclear, precipitated by or coincident with COVID VACCINE booster. .  RUQ [place] ordered.  History of fatty liver but enzymes are normal.   Lab Results  Component Value Date   LIPASE 36.0 04/20/2020   Lab Results  Component Value Date   ALT 13 04/20/2020   AST 17 04/20/2020   ALKPHOS 57 04/20/2020   BILITOT 0.4 04/20/2020         Relevant Orders   Lipase (Completed)   US Abdomen Limited RUQ (LIVER/GB)   Prediabetes    Her  random glucose is not  elevated but her A1c suggests she is at risk for developing diabetes.  I recommend he follow a low glycemic index diet and  particpate regularly in an aerobic  exercise activity.  We should check an A1c in 6 months.  Lab Results  Component Value Date   HGBA1C 5.8 04/20/2020         Relevant Orders   Lipid panel   Hemoglobin A1c   Hepatic steatosis    Not likely the cause of her RUQ pain  Given normal enzymes today  Lab Results  Component Value Date   ALT 13 04/20/2020   AST 17 04/20/2020   ALKPHOS 57 04/20/2020   BILITOT 0.4 04/20/2020         Atherosclerosis of coronary artery    Diagnosed in 2018 at time of diagnosis by CT.  She is asymptomatic  taking a statin and asa and is at goal   Lab Results  Component Value Date   CHOL 161 08/24/2019   HDL 54.10 08/24/2019   LDLCALC 72 08/24/2019   LDLDIRECT 83.0 02/26/2019   TRIG 176.0 (H) 08/24/2019   CHOLHDL 3 08/24/2019         Atherosclerosis of aorta [facility])    Reviewed findings of prior CT scan today..  Patient is tolerating high potency statin therapy and LDL is at goal  Lab Results  Component Value Date   CHOL 161 08/24/2019   HDL 54.10 08/24/2019   LDLCALC 72 08/24/2019   LDLDIRECT 83.0 02/26/2019   TRIG 176.0 (H) 08/24/2019   CHOLHDL 3 08/24/2019         Abnormal mammogram with microcalcification    Decreased size suggesting benign nature  Follow up with diagnositic mammogram and UA in Jan 2023/       Other Visit Diagnoses    Fatty liver    -  Primary   Relevant Orders   Comprehensive metabolic panel (Completed)   Comprehensive metabolic panel   Left upper quadrant abdominal pain       Hair loss       Relevant Orders   TSH (Completed)   Impaired fasting glucose       Relevant Orders   Hemoglobin A1c (Completed)     I provided  30 minutes of  face-to-face time during this encounter reviewing patient's  current problems and past surgeries, labs and imaging studies, providing counseling on the above mentioned problems , and coordination  of care .  I am having Carynn A. Sassano maintain her Vitamin D3, aspirin,  fluticasone, famotidine, cetirizine, Fluocinolone Acetonide, NON FORMULARY, nystatin, Premarin, Psyllium (METAMUCIL FIBER PO), cyanocobalamin, amLODipine, simvastatin, predniSONE, azithromycin, and telmisartan.  No orders of the defined types were placed in this encounter.   There are no discontinued medications.  Follow-up: No follow-ups on file.   Crecencio Mc, MD

## 2020-04-21 LAB — COMPREHENSIVE METABOLIC PANEL
ALT: 13 U/L (ref 0–35)
AST: 17 U/L (ref 0–37)
Albumin: 4.4 g/dL (ref 3.5–5.2)
Alkaline Phosphatase: 57 U/L (ref 39–117)
BUN: 18 mg/dL (ref 6–23)
CO2: 26 mEq/L (ref 19–32)
Calcium: 10 mg/dL (ref 8.4–10.5)
Chloride: 105 mEq/L (ref 96–112)
Creatinine, Ser: 1.09 mg/dL (ref 0.40–1.20)
GFR: 51.39 mL/min — ABNORMAL LOW (ref >60.00)
Glucose, Bld: 79 mg/dL (ref 70–99)
Potassium: 4.7 mEq/L (ref 3.5–5.1)
Sodium: 141 mEq/L (ref 135–145)
Total Bilirubin: 0.4 mg/dL (ref 0.2–1.2)
Total Protein: 6.9 g/dL (ref 6.0–8.3)

## 2020-04-21 LAB — HEMOGLOBIN A1C: Hgb A1c MFr Bld: 5.8 % (ref 4.6–6.5)

## 2020-04-21 LAB — LIPASE: Lipase: 36 U/L (ref 11.0–59.0)

## 2020-04-21 LAB — TSH: TSH: 0.93 u[IU]/mL (ref 0.35–4.50)

## 2020-04-22 ENCOUNTER — Other Ambulatory Visit: Payer: Self-pay

## 2020-04-22 ENCOUNTER — Ambulatory Visit
Admission: RE | Admit: 2020-04-22 | Discharge: 2020-04-22 | Disposition: A | Discharge status: Home / Self Care | Payer: Medicare Other | Source: Ambulatory Visit | Attending: Internal Medicine | Admitting: Internal Medicine

## 2020-04-22 DIAGNOSIS — R1011 Right upper quadrant pain: Secondary | ICD-10-CM | POA: Diagnosis not present

## 2020-04-22 DIAGNOSIS — K760 Fatty (change of) liver, not elsewhere classified: Secondary | ICD-10-CM | POA: Diagnosis not present

## 2020-04-23 ENCOUNTER — Encounter: Payer: Self-pay | Admitting: Internal Medicine

## 2020-04-23 DIAGNOSIS — L650 Telogen effluvium: Secondary | ICD-10-CM | POA: Insufficient documentation

## 2020-04-23 DIAGNOSIS — R7303 Prediabetes: Secondary | ICD-10-CM | POA: Insufficient documentation

## 2020-04-23 DIAGNOSIS — R1011 Right upper quadrant pain: Secondary | ICD-10-CM | POA: Insufficient documentation

## 2020-04-23 NOTE — Assessment & Plan Note (Signed)
Diagnosed in 2018 at time of diagnosis by CT.  She is asymptomatic  taking a statin and asa and is at goal   Lab Results  Component Value Date   CHOL 161 08/24/2019   HDL 54.10 08/24/2019   LDLCALC 72 08/24/2019   LDLDIRECT 83.0 02/26/2019   TRIG 176.0 (H) 08/24/2019   CHOLHDL 3 08/24/2019

## 2020-04-23 NOTE — Assessment & Plan Note (Signed)
Not likely the cause of her RUQ pain  Given normal enzymes today  Lab Results  Component Value Date   ALT 13 04/20/2020   AST 17 04/20/2020   ALKPHOS 57 04/20/2020   BILITOT 0.4 04/20/2020

## 2020-04-23 NOTE — Assessment & Plan Note (Signed)
Her  random glucose is not  elevated but her A1c suggests she is at risk for developing diabetes.  I recommend he follow a low glycemic index diet and particpate regularly in an aerobic  exercise activity.  We should check an A1c in 6 months.  Lab Results  Component Value Date   HGBA1C 5.8 04/20/2020

## 2020-04-23 NOTE — Assessment & Plan Note (Signed)
Well controlled on current regimen of amlodipine and telisartan. Renal function stable, no changes today.  Lab Results  Component Value Date   CREATININE 1.09 04/20/2020   Lab Results  Component Value Date   NA 141 04/20/2020   K 4.7 04/20/2020   CL 105 04/20/2020   CO2 26 04/20/2020

## 2020-04-23 NOTE — Assessment & Plan Note (Addendum)
Etiology unclear, precipitated by or coincident with COVID VACCINE booster. .  RUQ [place] ordered.  History of fatty liver but enzymes are normal.   Lab Results  Component Value Date   LIPASE 36.0 04/20/2020   Lab Results  Component Value Date   ALT 13 04/20/2020   AST 17 04/20/2020   ALKPHOS 57 04/20/2020   BILITOT 0.4 04/20/2020

## 2020-04-23 NOTE — Assessment & Plan Note (Signed)
Likely due to stress of vaccination or undiagnosed infection.  TSH is normal   Lab Results  Component Value Date   TSH 0.93 04/20/2020

## 2020-04-27 ENCOUNTER — Other Ambulatory Visit: Payer: Self-pay

## 2020-04-27 ENCOUNTER — Ambulatory Visit (INDEPENDENT_AMBULATORY_CARE_PROVIDER_SITE_OTHER): Payer: Medicare Other

## 2020-04-27 DIAGNOSIS — E538 Deficiency of other specified B group vitamins: Secondary | ICD-10-CM

## 2020-04-27 MED ORDER — CYANOCOBALAMIN 1000 MCG/ML IJ SOLN
1000.0000 ug | Freq: Once | INTRAMUSCULAR | Status: AC
  Administered 2020-04-27: 1000 ug via INTRAMUSCULAR

## 2020-04-27 NOTE — Progress Notes (Signed)
Patient presented for B 12 injection to left deltoid, patient voiced no concerns nor showed any signs of distress during injection. Nina,cma  

## 2020-05-05 MED ORDER — CYANOCOBALAMIN 1000 MCG/ML IJ SOLN
1000.0000 ug | Freq: Once | INTRAMUSCULAR | Status: AC
  Administered 2020-05-05: 1000 ug via INTRAMUSCULAR

## 2020-05-05 NOTE — Addendum Note (Signed)
Addended by: Fulton Mole D on: 05/05/2020 03:24 PM   Modules accepted: Orders

## 2020-05-08 ENCOUNTER — Other Ambulatory Visit: Payer: Self-pay | Admitting: Internal Medicine

## 2020-05-31 ENCOUNTER — Other Ambulatory Visit: Payer: Self-pay

## 2020-05-31 ENCOUNTER — Ambulatory Visit (INDEPENDENT_AMBULATORY_CARE_PROVIDER_SITE_OTHER): Payer: Medicare Other

## 2020-05-31 DIAGNOSIS — R920 Mammographic microcalcification found on diagnostic imaging of breast: Secondary | ICD-10-CM

## 2020-05-31 DIAGNOSIS — E538 Deficiency of other specified B group vitamins: Secondary | ICD-10-CM

## 2020-05-31 MED ORDER — CYANOCOBALAMIN 1000 MCG/ML IJ SOLN
1000.0000 ug | Freq: Once | INTRAMUSCULAR | Status: AC
  Administered 2020-05-31: 1000 ug via INTRAMUSCULAR

## 2020-05-31 NOTE — Progress Notes (Signed)
Patient presented for B 12 injection to right deltoid, patient voiced no concerns nor showed any signs of distress during injection. 

## 2020-06-01 DIAGNOSIS — D3131 Benign neoplasm of right choroid: Secondary | ICD-10-CM | POA: Diagnosis not present

## 2020-06-10 ENCOUNTER — Other Ambulatory Visit: Payer: Self-pay | Admitting: Internal Medicine

## 2020-07-01 ENCOUNTER — Other Ambulatory Visit: Payer: Self-pay

## 2020-07-01 ENCOUNTER — Ambulatory Visit (INDEPENDENT_AMBULATORY_CARE_PROVIDER_SITE_OTHER): Payer: Medicare Other

## 2020-07-01 DIAGNOSIS — E538 Deficiency of other specified B group vitamins: Secondary | ICD-10-CM

## 2020-07-01 MED ORDER — CYANOCOBALAMIN 1000 MCG/ML IJ SOLN
1000.0000 ug | Freq: Once | INTRAMUSCULAR | Status: AC
  Administered 2020-07-01: 1000 ug via INTRAMUSCULAR

## 2020-07-01 NOTE — Progress Notes (Signed)
Patient presented for B 12 injection to left deltoid, patient voiced no concerns nor showed any signs of distress during injection. 

## 2020-08-02 ENCOUNTER — Ambulatory Visit (INDEPENDENT_AMBULATORY_CARE_PROVIDER_SITE_OTHER): Payer: Medicare Other

## 2020-08-02 ENCOUNTER — Other Ambulatory Visit: Payer: Self-pay

## 2020-08-02 DIAGNOSIS — E538 Deficiency of other specified B group vitamins: Secondary | ICD-10-CM

## 2020-08-02 MED ORDER — CYANOCOBALAMIN 1000 MCG/ML IJ SOLN
1000.0000 ug | Freq: Once | INTRAMUSCULAR | Status: AC
Start: 2020-08-02 — End: 2020-08-02
  Administered 2020-08-02: 1000 ug via INTRAMUSCULAR

## 2020-08-02 NOTE — Progress Notes (Signed)
Patient presented for B 12 injection to right deltoid, patient voiced no concerns nor showed any signs of distress during injection. 

## 2020-08-09 ENCOUNTER — Other Ambulatory Visit: Payer: Self-pay | Admitting: Internal Medicine

## 2020-09-01 ENCOUNTER — Ambulatory Visit (INDEPENDENT_AMBULATORY_CARE_PROVIDER_SITE_OTHER): Payer: Medicare Other

## 2020-09-01 ENCOUNTER — Other Ambulatory Visit: Payer: Self-pay

## 2020-09-01 DIAGNOSIS — E538 Deficiency of other specified B group vitamins: Secondary | ICD-10-CM | POA: Diagnosis not present

## 2020-09-01 MED ORDER — CYANOCOBALAMIN 1000 MCG/ML IJ SOLN
1000.0000 ug | Freq: Once | INTRAMUSCULAR | Status: AC
  Administered 2020-09-01: 1000 ug via INTRAMUSCULAR

## 2020-09-01 NOTE — Progress Notes (Signed)
Patient presented for B 12 injection to left deltoid, patient voiced no concerns nor showed any signs of distress during injection. 

## 2020-10-04 ENCOUNTER — Ambulatory Visit (INDEPENDENT_AMBULATORY_CARE_PROVIDER_SITE_OTHER): Payer: Medicare Other

## 2020-10-04 ENCOUNTER — Other Ambulatory Visit: Payer: Self-pay

## 2020-10-04 DIAGNOSIS — E538 Deficiency of other specified B group vitamins: Secondary | ICD-10-CM | POA: Diagnosis not present

## 2020-10-04 MED ORDER — CYANOCOBALAMIN 1000 MCG/ML IJ SOLN
1000.0000 ug | Freq: Once | INTRAMUSCULAR | Status: AC
  Administered 2020-10-04: 1000 ug via INTRAMUSCULAR

## 2020-10-04 NOTE — Progress Notes (Signed)
Patient presented for B 12 injection to right deltoid, patient voiced no concerns nor showed any signs of distress during injection. 

## 2020-10-05 ENCOUNTER — Other Ambulatory Visit: Payer: Self-pay | Admitting: Obstetrics and Gynecology

## 2020-10-05 ENCOUNTER — Other Ambulatory Visit: Payer: Self-pay | Admitting: Internal Medicine

## 2020-10-17 DIAGNOSIS — Z85828 Personal history of other malignant neoplasm of skin: Secondary | ICD-10-CM | POA: Diagnosis not present

## 2020-10-17 DIAGNOSIS — L821 Other seborrheic keratosis: Secondary | ICD-10-CM | POA: Diagnosis not present

## 2020-10-17 DIAGNOSIS — L578 Other skin changes due to chronic exposure to nonionizing radiation: Secondary | ICD-10-CM | POA: Diagnosis not present

## 2020-10-19 ENCOUNTER — Encounter: Payer: Self-pay | Admitting: Internal Medicine

## 2020-10-19 ENCOUNTER — Ambulatory Visit (INDEPENDENT_AMBULATORY_CARE_PROVIDER_SITE_OTHER): Payer: Medicare Other | Admitting: Internal Medicine

## 2020-10-19 ENCOUNTER — Other Ambulatory Visit: Payer: Self-pay

## 2020-10-19 VITALS — BP 118/74 | HR 66 | Temp 97.1°F | Resp 15 | Ht 65.0 in | Wt 206.6 lb

## 2020-10-19 DIAGNOSIS — K760 Fatty (change of) liver, not elsewhere classified: Secondary | ICD-10-CM | POA: Diagnosis not present

## 2020-10-19 DIAGNOSIS — R7303 Prediabetes: Secondary | ICD-10-CM | POA: Diagnosis not present

## 2020-10-19 DIAGNOSIS — N1831 Chronic kidney disease, stage 3a: Secondary | ICD-10-CM

## 2020-10-19 DIAGNOSIS — N6310 Unspecified lump in the right breast, unspecified quadrant: Secondary | ICD-10-CM | POA: Diagnosis not present

## 2020-10-19 DIAGNOSIS — E782 Mixed hyperlipidemia: Secondary | ICD-10-CM

## 2020-10-19 DIAGNOSIS — I10 Essential (primary) hypertension: Secondary | ICD-10-CM

## 2020-10-19 DIAGNOSIS — Z0000 Encounter for general adult medical examination without abnormal findings: Secondary | ICD-10-CM

## 2020-10-19 DIAGNOSIS — Z9229 Personal history of other drug therapy: Secondary | ICD-10-CM | POA: Diagnosis not present

## 2020-10-19 DIAGNOSIS — E538 Deficiency of other specified B group vitamins: Secondary | ICD-10-CM

## 2020-10-19 LAB — CBC WITH DIFFERENTIAL/PLATELET
Basophils Absolute: 0 10*3/uL (ref 0.0–0.1)
Basophils Relative: 0.4 % (ref 0.0–3.0)
Eosinophils Absolute: 0.1 10*3/uL (ref 0.0–0.7)
Eosinophils Relative: 1.2 % (ref 0.0–5.0)
HCT: 36.9 % (ref 36.0–46.0)
Hemoglobin: 12.4 g/dL (ref 12.0–15.0)
Lymphocytes Relative: 29.3 % (ref 12.0–46.0)
Lymphs Abs: 2.4 10*3/uL (ref 0.7–4.0)
MCHC: 33.6 g/dL (ref 30.0–36.0)
MCV: 89.4 fl (ref 78.0–100.0)
Monocytes Absolute: 0.6 10*3/uL (ref 0.1–1.0)
Monocytes Relative: 7.6 % (ref 3.0–12.0)
Neutro Abs: 5.1 10*3/uL (ref 1.4–7.7)
Neutrophils Relative %: 61.5 % (ref 43.0–77.0)
Platelets: 199 10*3/uL (ref 150.0–400.0)
RBC: 4.13 Mil/uL (ref 3.87–5.11)
RDW: 13.8 % (ref 11.5–15.5)
WBC: 8.2 10*3/uL (ref 4.0–10.5)

## 2020-10-19 LAB — COMPREHENSIVE METABOLIC PANEL
ALT: 10 U/L (ref 0–35)
AST: 14 U/L (ref 0–37)
Albumin: 4.2 g/dL (ref 3.5–5.2)
Alkaline Phosphatase: 68 U/L (ref 39–117)
BUN: 21 mg/dL (ref 6–23)
CO2: 23 mEq/L (ref 19–32)
Calcium: 9.5 mg/dL (ref 8.4–10.5)
Chloride: 106 mEq/L (ref 96–112)
Creatinine, Ser: 1.14 mg/dL (ref 0.40–1.20)
GFR: 48.53 mL/min — ABNORMAL LOW (ref >60.00)
Glucose, Bld: 83 mg/dL (ref 70–99)
Potassium: 4.5 mEq/L (ref 3.5–5.1)
Sodium: 139 mEq/L (ref 135–145)
Total Bilirubin: 0.6 mg/dL (ref 0.2–1.2)
Total Protein: 6.5 g/dL (ref 6.0–8.3)

## 2020-10-19 LAB — LIPID PANEL
Cholesterol: 146 mg/dL (ref 0–200)
HDL: 53.6 mg/dL (ref >39.00)
LDL Cholesterol: 57 mg/dL (ref 0–99)
NonHDL: 92.87
Total CHOL/HDL Ratio: 3
Triglycerides: 178 mg/dL — ABNORMAL HIGH (ref 0.0–149.0)
VLDL: 35.6 mg/dL (ref 0.0–40.0)

## 2020-10-19 LAB — HEMOGLOBIN A1C: Hgb A1c MFr Bld: 5.7 % (ref 4.6–6.5)

## 2020-10-19 NOTE — Progress Notes (Signed)
Patient ID: Jillian [name], female    DOB: [birth year]  Age: 70 y.o. MRN: [id number]  The patient is here for annual preventive examination and management of other chronic and acute problems.  This visit occurred during the SARS-CoV-2 public health emergency.  Safety protocols were in place, including screening questions prior to the visit, additional usage of staff PPE, and extensive cleaning of exam room while observing appropriate contact time as indicated for disinfecting solutions.     The risk factors are reflected in the social history.  The roster of all physicians providing medical care to patient - is listed in the Snapshot section of the chart.  Activities of daily living:  The patient is 100% independent in all ADLs: dressing, toileting, feeding as well as independent mobility  Home safety : The patient has smoke detectors in the home. They wear seatbelts.  There are no firearms at home. There is no violence in the home.   There is no risks for hepatitis, STDs or HIV. There is no   history of blood transfusion. They have no travel history to infectious disease endemic areas of the world.  The patient has seen their dentist in the last six month. They have seen their eye doctor in the last year. They admit to slight hearing difficulty with regard to whispered voices and some television programs.  They have deferred audiologic testing in the last year.  They do not  have excessive sun exposure. Discussed the need for sun protection: hats, long sleeves and use of sunscreen if there is significant sun exposure.   Diet: the importance of a healthy diet is discussed. They do have a healthy diet.  The benefits of regular aerobic exercise were discussed. She walks 4 times per week ,  20 minutes.   Depression screen: there are no signs or vegative symptoms of depression- irritability, change in appetite, anhedonia, sadness/tearfullness.  Cognitive assessment: the patient manages all  their financial and personal affairs and is actively engaged. They could relate day,date,year and events; recalled 2/3 objects at 3 minutes; performed clock-face test normally.  The following portions of the patient's history were reviewed and updated as appropriate: allergies, current medications, past family history, past medical history,  past surgical history, past social history  and problem list.  Visual acuity was not assessed per patient preference since she has regular follow up with her ophthalmologist. Hearing and body mass index were assessed and reviewed.   During the course of the visit the patient was educated and counseled about appropriate screening and preventive services including : fall prevention , diabetes screening, nutrition counseling, colorectal cancer screening, and recommended immunizations.    CC: The primary encounter diagnosis was B12 deficiency. Diagnoses of Stage 3a chronic kidney disease ([place]), Prediabetes, Mixed hyperlipidemia, COVID-19 vaccine series completed, Breast mass, right, Hepatic steatosis, Primary hypertension, and Encounter for preventive health examination were also pertinent to this visit.  1) saw Hinda Kehr last month for her annual dermatology check.     2)  [facility]  3) CKD:  she is watching diet.  No sweet tea or sodas .  Avoiding NSAIDs  4) mammogram done in Jan,  [place] July 2021 no change to osteopenia.  Cologuard July 2021 negative.   History Jillian [name] has a past medical history of Allergy, Arthritis, Cancer ([place]), Chronic right maxillary sinusitis (08/13/2018), GERD (gastroesophageal reflux disease), Hyperlipidemia, Hypertension, Irritable bowel syndrome (IBS) (Oct  2011), Neuropathy, Thrombophlebitis of left lower extremity ([place]) (09/29/2019), and Vertigo.   She has a past surgical history that includes Tubal ligation (1977); Tonsillectomy and adenoidectomy (1956); Vein  Surgery; Cataract extraction w/PHACO (Left, 04/09/2017); Cataract extraction w/PHACO (Right, 04/30/2017); right breast bx (03/12/2019); and Breast biopsy (Right, 03/12/2019).   Her family history includes Breast cancer (age of onset: 46) in her sister; Cancer in her cousin; Hyperlipidemia in her father, maternal grandfather, maternal grandmother, and mother; Hypertension in her father.She reports that she quit smoking about 21 years ago. Her smoking use included cigarettes. She has never used smokeless tobacco. She reports that she does not drink alcohol and does not use drugs.  Outpatient Medications Prior to Visit  Medication Sig Dispense Refill   amLODipine (NORVASC) 2.5 MG tablet TAKE ONE TABLET BY MOUTH DAILY 30 tablet 1   aspirin 81 MG tablet Take 81 mg by mouth daily.     cetirizine (ZYRTEC) 10 MG tablet Take 10 mg by mouth at bedtime.     Cholecalciferol (VITAMIN D3) 1000 UNITS CAPS Take 1,000 Units by mouth daily.     famotidine (PEPCID) 10 MG tablet Take 10 mg by mouth daily. As needed     Fluocinolone Acetonide 0.01 % OIL Apply 1 application topically See admin instructions. Apply to affected ear daily as needed for dermatisis     fluticasone (FLONASE) 50 MCG/ACT nasal spray PLACE 2 SPRAYS INTO THE NOSE DAILY. 16 g 4   NON FORMULARY Apply 1 application topically daily as needed (for dermatisis on face). Hydrocortisone 2.5%/Ketoconazole 2% Cream     nystatin (MYCOSTATIN/NYSTOP) powder Apply topically 2 (two) times daily. To rash until resolved. 15 g 0   PREMARIN vaginal cream PLACE ONE APPLICATORFUL VAGINALLY DAILY FOR 14 DAYS THEN DECREASE TO 3 TIMES WEEKLY 30 g 11   Psyllium (METAMUCIL FIBER PO) Take by mouth. Take 1 tablet daily.     simvastatin (ZOCOR) 40 MG tablet TAKE ONE TABLET BY MOUTH EVERY NIGHT AT BEDTIME 30 tablet 1   telmisartan (MICARDIS) 80 MG tablet TAKE ONE TABLET BY MOUTH EVERY EVENING 30 tablet 2   azithromycin (ZITHROMAX) 250 MG tablet 2 pills day 1 and 1 pill day 2-5  with food (Patient not taking: Reported on 10/19/2020) 6 tablet 0   No facility-administered medications prior to visit.    Review of Systems  Objective:  BP 118/74 (BP Location: Left Arm, Patient Position: Sitting, Cuff Size: Large)   Pulse 66   Temp (!) 97.1 F (36.2 C) (Temporal)   Resp 15   Ht '5\' 5"'$  (1.651 m)   Wt 206 lb 9.6 oz (93.7 kg)   SpO2 96%   BMI 34.38 kg/m   Physical Exam  Physical Exam   Assessment & Plan:   Problem List Items Addressed This Visit       Unprioritized   B12 deficiency - Primary   Breast mass, right    She has had diagnostic mammogram of right breast every 6 months x 2 with no change.  She has been advised to resume screening Jan 2023      CKD (chronic kidney disease) stage 3, GFR 30-59 ml/min (HCC)    Repeat labs show stability.  She is avoiding NSAIDs,  Nephrology follow up every 6 months .  Lab Results  Component Value Date   CREATININE 1.14 10/19/2020   Lab Results  Component Value Date   NA 139 10/19/2020   K 4.5 10/19/2020   CL 106 10/19/2020   CO2  23 10/19/2020          Relevant Orders   Comprehensive metabolic panel (Completed)   PTH, Intact and Calcium (Completed)   CBC with Differential/Platelet (Completed)   Encounter for preventive health examination    age appropriate education and counseling updated, referrals for preventative services and immunizations addressed, dietary and smoking counseling addressed, most recent labs reviewed.  I have personally reviewed and have noted:   1) the patient's medical and social history 2) The pt's use of alcohol, tobacco, and illicit drugs 3) The patient's current medications and supplements 4) Functional ability including ADL's, fall risk, home safety risk, hearing and visual impairment 5) Diet and physical activities 6) Evidence for depression or mood disorder 7) The patient's height, weight, and BMI have been recorded in the chart   I have made referrals, and provided  counseling and education based on review of the above      Hepatic steatosis    Presumed by ultrasound changes and serologies negative for autoimmune causes of hepatitis.  Current liver enzymes are normal and all modifiable risk factors including obesity, diabetes and hyperlipidemia have been addressed   Lab Results  Component Value Date   ALT 10 10/19/2020   AST 14 10/19/2020   ALKPHOS 68 10/19/2020   BILITOT 0.6 10/19/2020         Hyperlipidemia     She is asymptomatic  taking a statin and asa and is at goal   Lab Results  Component Value Date   CHOL 146 10/19/2020   HDL 53.60 10/19/2020   LDLCALC 57 10/19/2020   LDLDIRECT 83.0 02/26/2019   TRIG 178.0 (H) 10/19/2020   CHOLHDL 3 10/19/2020         Relevant Orders   Lipid panel (Completed)   Hypertension    Well controlled on current regimen of amlodipine and telmisartan. . Renal function stable, no changes today.  Lab Results  Component Value Date   CREATININE 1.14 10/19/2020   Lab Results  Component Value Date   NA 139 10/19/2020   K 4.5 10/19/2020   CL 106 10/19/2020   CO2 23 10/19/2020         Prediabetes    She has lowered her A1c  With a low glycemic index diet and participation  in an aerobic  exercise activity.  We should check an A1c in 6 months.  Lab Results  Component Value Date   HGBA1C 5.7 10/19/2020         Relevant Orders   Hemoglobin A1c (Completed)   Other Visit Diagnoses     COVID-19 vaccine series completed       Relevant Orders   SARS-CoV-2 Semi-Quantitative Total Antibody, Spike       I have discontinued Jillian [name]'s azithromycin. I am also having her maintain her Vitamin D3, aspirin, fluticasone, famotidine, cetirizine, Fluocinolone Acetonide, NON FORMULARY, nystatin, Psyllium (METAMUCIL FIBER PO), telmisartan, amLODipine, simvastatin, and Premarin.  No orders of the defined types were placed in this encounter.   Medications Discontinued During This Encounter   Medication Reason   azithromycin (ZITHROMAX) 250 MG tablet Completed Course    Follow-up: No follow-ups on file.   Crecencio Mc, MD

## 2020-10-19 NOTE — Assessment & Plan Note (Addendum)
Repeat labs show stability.  She is avoiding NSAIDs,  Nephrology follow up every 6 months .  Lab Results  Component Value Date   CREATININE 1.14 10/19/2020   Lab Results  Component Value Date   NA 139 10/19/2020   K 4.5 10/19/2020   CL 106 10/19/2020   CO2 23 10/19/2020

## 2020-10-20 LAB — PTH, INTACT AND CALCIUM
Calcium: 9.4 mg/dL (ref 8.6–10.4)
PTH: 78 pg/mL — ABNORMAL HIGH (ref 16–77)

## 2020-10-20 NOTE — Assessment & Plan Note (Signed)
She has had diagnostic mammogram of right breast every 6 months x 2 with no change.  She has been advised to resume screening Jan 2023

## 2020-10-20 NOTE — Assessment & Plan Note (Signed)
She has lowered her A1c  With a low glycemic index diet and participation  in an aerobic  exercise activity.  We should check an A1c in 6 months.  Lab Results  Component Value Date   HGBA1C 5.7 10/19/2020

## 2020-10-20 NOTE — Assessment & Plan Note (Signed)

## 2020-10-20 NOTE — Assessment & Plan Note (Signed)
Presumed by ultrasound changes and serologies negative for autoimmune causes of hepatitis.  Current liver enzymes are normal and all modifiable risk factors including obesity, diabetes and hyperlipidemia have been addressed   Lab Results  Component Value Date   ALT 10 10/19/2020   AST 14 10/19/2020   ALKPHOS 68 10/19/2020   BILITOT 0.6 10/19/2020

## 2020-10-20 NOTE — Assessment & Plan Note (Signed)
She is asymptomatic  taking a statin and asa and is at goal   Lab Results  Component Value Date   CHOL 146 10/19/2020   HDL 53.60 10/19/2020   LDLCALC 57 10/19/2020   LDLDIRECT 83.0 02/26/2019   TRIG 178.0 (H) 10/19/2020   CHOLHDL 3 10/19/2020

## 2020-10-20 NOTE — Assessment & Plan Note (Addendum)
Well controlled on current regimen of amlodipine and telmisartan. . Renal function stable, no changes today.  Lab Results  Component Value Date   CREATININE 1.14 10/19/2020   Lab Results  Component Value Date   NA 139 10/19/2020   K 4.5 10/19/2020   CL 106 10/19/2020   CO2 23 10/19/2020

## 2020-10-22 LAB — SARS-COV-2 SEMI-QUANTITATIVE TOTAL ANTIBODY, SPIKE: SARS COV2 AB, Total Spike Semi QN: >2500 U/mL — ABNORMAL HIGH (ref <0.8)

## 2020-11-01 DIAGNOSIS — I10 Essential (primary) hypertension: Secondary | ICD-10-CM | POA: Diagnosis not present

## 2020-11-01 DIAGNOSIS — N1831 Chronic kidney disease, stage 3a: Secondary | ICD-10-CM | POA: Diagnosis not present

## 2020-11-05 ENCOUNTER — Other Ambulatory Visit: Payer: Self-pay | Admitting: Internal Medicine

## 2020-11-08 ENCOUNTER — Ambulatory Visit (INDEPENDENT_AMBULATORY_CARE_PROVIDER_SITE_OTHER): Payer: Medicare Other

## 2020-11-08 ENCOUNTER — Other Ambulatory Visit: Payer: Self-pay

## 2020-11-08 DIAGNOSIS — E538 Deficiency of other specified B group vitamins: Secondary | ICD-10-CM | POA: Diagnosis not present

## 2020-11-08 MED ORDER — CYANOCOBALAMIN 1000 MCG/ML IJ SOLN
1000.0000 ug | Freq: Once | INTRAMUSCULAR | Status: AC
  Administered 2020-11-08: 1000 ug via INTRAMUSCULAR

## 2020-11-08 NOTE — Progress Notes (Signed)
Patient presented for B 12 injection to left deltoid, patient voiced no concerns nor showed any signs of distress during injection. 

## 2020-11-30 ENCOUNTER — Encounter: Payer: Medicare Other | Admitting: Obstetrics and Gynecology

## 2020-12-05 ENCOUNTER — Other Ambulatory Visit: Payer: Self-pay | Admitting: Internal Medicine

## 2020-12-09 ENCOUNTER — Ambulatory Visit (INDEPENDENT_AMBULATORY_CARE_PROVIDER_SITE_OTHER): Payer: Medicare Other

## 2020-12-09 ENCOUNTER — Other Ambulatory Visit: Payer: Self-pay

## 2020-12-09 DIAGNOSIS — E538 Deficiency of other specified B group vitamins: Secondary | ICD-10-CM

## 2020-12-09 MED ORDER — CYANOCOBALAMIN 1000 MCG/ML IJ SOLN
1000.0000 ug | Freq: Once | INTRAMUSCULAR | Status: AC
  Administered 2020-12-09: 1000 ug via INTRAMUSCULAR

## 2020-12-09 NOTE — Progress Notes (Signed)
Patient presented for B 12 injection to left deltoid, patient voiced no concerns nor showed any signs of distress during injection   Maddy Graham,cma   

## 2020-12-20 ENCOUNTER — Encounter: Payer: Self-pay | Admitting: Obstetrics and Gynecology

## 2020-12-20 ENCOUNTER — Ambulatory Visit: Payer: Medicare Other | Admitting: Obstetrics and Gynecology

## 2020-12-20 ENCOUNTER — Other Ambulatory Visit: Payer: Self-pay

## 2020-12-20 VITALS — BP 112/62 | HR 68 | Ht 65.0 in | Wt 206.7 lb

## 2020-12-20 DIAGNOSIS — N952 Postmenopausal atrophic vaginitis: Secondary | ICD-10-CM | POA: Diagnosis not present

## 2020-12-20 MED ORDER — ESTRADIOL 0.1 MG/GM VA CREA: 0.5000 g | TOPICAL_CREAM | VAGINAL | 2 refills | Status: AC

## 2020-12-20 NOTE — Progress Notes (Signed)
GYNECOLOGY PROGRESS NOTE  Subjective:    Patient ID: Jillian [name], female    DOB: [birth year], 70 y.o.   MRN: [id number]  HPI  Patient is a 70 y.o. [id number] female who presents for 1 year f/u of atrophic vaginitis.  She denies complaints. Notes continued maintained improvement of symptoms on the Premarin cream and intermittent use of hydrocortisone cream. Has had an occasional issue earlier this year, but thinks it was related to her performing water aerobics.    The following portions of the patient's history were reviewed and updated as appropriate:  She  has a past medical history of Allergy, Arthritis, Cancer ([place]), Chronic right maxillary sinusitis (08/13/2018), GERD (gastroesophageal reflux disease), Hyperlipidemia, Hypertension, Irritable bowel syndrome (IBS) (Oct 2011), Neuropathy, Thrombophlebitis of left lower extremity ([place]) (09/29/2019), and Vertigo.  She  has a past surgical history that includes Tubal ligation (1977); Tonsillectomy and adenoidectomy (1956); Vein Surgery; Cataract extraction w/PHACO (Left, 04/09/2017); Cataract extraction w/PHACO (Right, 04/30/2017); right breast bx (03/12/2019); and Breast biopsy (Right, 03/12/2019).  Her family history includes Breast cancer (age of onset: 35) in her sister; Cancer in her cousin; Hyperlipidemia in her father, maternal grandfather, maternal grandmother, and mother; Hypertension in her father.  She  reports that she quit smoking about 21 years ago. Her smoking use included cigarettes. She has never used smokeless tobacco. She reports that she does not drink alcohol and does not use drugs.  Current Outpatient Medications on File Prior to Visit  Medication Sig Dispense Refill   amLODipine (NORVASC) 2.5 MG tablet TAKE ONE TABLET BY MOUTH DAILY 30 tablet 1   aspirin 81 MG tablet Take 81 mg by mouth daily.     cetirizine (ZYRTEC) 10 MG tablet Take 10 mg by mouth at bedtime.     Cholecalciferol (VITAMIN D3) 1000 UNITS CAPS Take 1,000  Units by mouth daily.     famotidine (PEPCID) 10 MG tablet Take 10 mg by mouth daily. As needed     Fluocinolone Acetonide 0.01 % OIL Apply 1 application topically See admin instructions. Apply to affected ear daily as needed for dermatisis     fluticasone (FLONASE) 50 MCG/ACT nasal spray PLACE 2 SPRAYS INTO THE NOSE DAILY. 16 g 4   NON FORMULARY Apply 1 application topically daily as needed (for dermatisis on face). Hydrocortisone 2.5%/Ketoconazole 2% Cream     nystatin (MYCOSTATIN/NYSTOP) powder Apply topically 2 (two) times daily. To rash until resolved. 15 g 0   PREMARIN vaginal cream PLACE ONE APPLICATORFUL VAGINALLY DAILY FOR 14 DAYS THEN DECREASE TO 3 TIMES WEEKLY 30 g 11   Psyllium (METAMUCIL FIBER PO) Take by mouth. Take 1 tablet daily.     simvastatin (ZOCOR) 40 MG tablet TAKE ONE TABLET BY MOUTH EVERY NIGHT AT BEDTIME 30 tablet 1   telmisartan (MICARDIS) 80 MG tablet TAKE ONE TABLET BY MOUTH EVERY EVENING 30 tablet 2   No current facility-administered medications on file prior to visit.   She is allergic to doxycycline, sulfa drugs cross reactors, and tetanus toxoids..  Review of Systems Pertinent items noted in HPI and remainder of comprehensive ROS otherwise negative.   Objective:   Blood pressure 112/62, pulse 68, height 5\' 5"  (1.651 m), weight 206 lb 11.2 oz (93.8 kg). General appearance: alert and no distress Pelvis: external genitalia normal, rectovaginal septum normal.  Vagina without discharge, well-estrogenized, normal color.  Cervix normal appearing, no lesions and no tenderness. Uterus mobile, nontender, normal shape and size.  Adnexae non-palpable, nontender bilaterally.  Assessment:   Atrophic vaginitis  Plan:   - Patient continues to do well with use of Premarin cream for management of the atrophic vaginitis. Patient can continue use, 2-3 times weekly, 0.5 mg.  - She notes that her Premarin cream is a little expensive for her. Will change to generic (Estrace  cream). If still having issues with cost, will use GoodRx.  - To f/u in 1 year or as needed.     Rubie Maid, MD [facility]

## 2020-12-20 NOTE — Patient Instructions (Signed)
Atrophic Vaginitis Atrophic vaginitis is a condition in which the tissues that line the vagina become dry and thin. This condition is most common in women who have stopped having regular menstrual periods (are in menopause). This usually starts when a woman is 45 to 71 years old. That is the time when a woman's estrogen levels begin to decrease. Estrogen is a female hormone. It helps to keep the tissues of the vagina moist. It stimulates the vagina to produce a clear fluid that lubricates the vagina for sex. This fluid also protects the vagina from infection. Lack of estrogen can cause the lining of the vagina to get thinner and dryer. The vagina may also shrink in size. It may become less elastic. Atrophic vaginitis tends to get worse over time as a woman's estrogen level drops. What are the causes? This condition is caused by the normal drop in estrogen that happens around the time of menopause. What increases the risk? Certain conditions or situations may lower a woman's estrogen level, leading to a higher risk for atrophic vaginitis. You are more likely to develop this condition if: You are taking medicines that block estrogen. You have had your ovaries removed. You are being treated for cancer with radiation or medicines (chemotherapy). You have given birth or are breastfeeding. You are older than age 50. You smoke. What are the signs or symptoms? Symptoms of this condition include: Pain, soreness, a feeling of pressure, or bleeding during sex (dyspareunia). Vaginal burning, irritation, or itching. Pain or bleeding when a speculum is used in a vaginal exam. Having burning pain while urinating. Vaginal discharge. In some cases, there are no symptoms. How is this diagnosed? This condition is diagnosed based on your medical history and a physical exam. This will include a pelvic exam that checks the vaginal tissues. Though rare, you may also have other tests, including: A urine test. A test  that checks the acid balance in your vagina (acid balance test). How is this treated? Treatment for this condition depends on how severe your symptoms are. Treatment may include: Using an over-the-counter vaginal lubricant before sex. Using a long-acting vaginal moisturizer. Using low-dose estrogen for moderate to severe symptoms that do not respond to other treatments. Options include creams, tablets, and inserts (vaginal rings). Before you use a vaginal estrogen, tell your health care provider if you have a history of: Breast cancer. Endometrial cancer. Blood clots. If you are not sexually active and your symptoms are very mild, you may not need treatment. Follow these instructions at home: Medicines Take over-the-counter and prescription medicines only as told by your health care provider. Do not use herbal or alternative medicines unless your health care provider says that you can. Use over-the-counter creams, lubricants, or moisturizers for dryness only as told by your health care provider. General instructions If your atrophic vaginitis is caused by menopause, discuss all of your menopause symptoms and treatment options with your health care provider. Do not douche. Do not use products that can make your vagina dry. These include: Scented feminine sprays. Scented tampons. Scented soaps. Vaginal sex can help to improve blood flow and elasticity of vaginal tissue. If you choose to have sex and it hurts, try using a water-soluble lubricant or moisturizer right before having sex. Contact a health care provider if: Your discharge looks different than normal. Your vagina has an unusual smell. You have new symptoms. Your symptoms do not improve with treatment. Your symptoms get worse. Summary Atrophic vaginitis is a condition in   which the tissues that line the vagina become dry and thin. It is most common in women who have stopped having regular menstrual periods (are in  menopause). Treatment options include using vaginal lubricants and low-dose vaginal estrogen. Contact a health care provider if your vagina has an unusual smell, or if your symptoms get worse or do not improve after treatment. This information is not intended to replace advice given to you by your health care provider. Make sure you discuss any questions you have with your health care provider. Document Revised: 08/13/2019 Document Reviewed: 08/13/2019 Elsevier Patient Education  2022 [employer]  

## 2021-01-08 DIAGNOSIS — R059 Cough, unspecified: Secondary | ICD-10-CM | POA: Diagnosis not present

## 2021-01-08 DIAGNOSIS — J209 Acute bronchitis, unspecified: Secondary | ICD-10-CM | POA: Diagnosis not present

## 2021-01-08 DIAGNOSIS — J029 Acute pharyngitis, unspecified: Secondary | ICD-10-CM | POA: Diagnosis not present

## 2021-01-08 DIAGNOSIS — Z20822 Contact with and (suspected) exposure to covid-19: Secondary | ICD-10-CM | POA: Diagnosis not present

## 2021-01-10 ENCOUNTER — Ambulatory Visit: Payer: Medicare Other

## 2021-01-16 ENCOUNTER — Ambulatory Visit: Payer: Medicare Other

## 2021-01-17 ENCOUNTER — Ambulatory Visit
Admission: RE | Admit: 2021-01-17 | Discharge: 2021-01-17 | Disposition: A | Discharge status: Home / Self Care | Payer: Medicare Other | Attending: Family | Admitting: Family

## 2021-01-17 ENCOUNTER — Encounter: Payer: Self-pay | Admitting: Family

## 2021-01-17 ENCOUNTER — Ambulatory Visit (INDEPENDENT_AMBULATORY_CARE_PROVIDER_SITE_OTHER): Payer: Medicare Other | Admitting: Family

## 2021-01-17 ENCOUNTER — Other Ambulatory Visit: Payer: Self-pay

## 2021-01-17 ENCOUNTER — Ambulatory Visit (INDEPENDENT_AMBULATORY_CARE_PROVIDER_SITE_OTHER): Payer: Medicare Other

## 2021-01-17 ENCOUNTER — Ambulatory Visit: Payer: Medicare Other

## 2021-01-17 ENCOUNTER — Ambulatory Visit
Admission: RE | Admit: 2021-01-17 | Discharge: 2021-01-17 | Disposition: A | Discharge status: Home / Self Care | Payer: Medicare Other | Source: Ambulatory Visit | Attending: Family | Admitting: Family

## 2021-01-17 VITALS — BP 130/72 | HR 65 | Temp 97.7°F | Resp 18 | Ht 65.0 in | Wt 207.5 lb

## 2021-01-17 DIAGNOSIS — J209 Acute bronchitis, unspecified: Secondary | ICD-10-CM

## 2021-01-17 DIAGNOSIS — R051 Acute cough: Secondary | ICD-10-CM

## 2021-01-17 DIAGNOSIS — E538 Deficiency of other specified B group vitamins: Secondary | ICD-10-CM | POA: Diagnosis not present

## 2021-01-17 DIAGNOSIS — R059 Cough, unspecified: Secondary | ICD-10-CM | POA: Diagnosis not present

## 2021-01-17 MED ORDER — CYANOCOBALAMIN 1000 MCG/ML IJ SOLN
1000.0000 ug | Freq: Once | INTRAMUSCULAR | Status: AC
  Administered 2021-01-17: 1000 ug via INTRAMUSCULAR

## 2021-01-17 MED ORDER — PREDNISONE 10 MG (21) PO TBPK: ORAL_TABLET | ORAL | 0 refills | Status: DC

## 2021-01-17 NOTE — Progress Notes (Signed)
Patient presented for B 12 injection to left deltoid, patient voiced no concerns nor showed any signs of distress during injection. 

## 2021-01-17 NOTE — Progress Notes (Signed)
Acute Office Visit  Subjective:    Patient ID: Jillian [name], female    DOB: 07-05-1950, 70 y.o.   MRN: [id number]  Chief Complaint  Patient presents with   Cough    Saw UC on 01/08/21 for URI given Cefdinir 300 mg twice daily for 10 days still coughing up green colored mucus.    HPI Patient is in today with persistent cough that is productive with green phelgm x  10 days. She has been tested for COVID and Flu that was negative. Has wheezing at bedtime. Denies any fever or chills. She has completed the cefdinir twice a day. Feels better but is concerned about the green phlegm   Past Medical History:  Diagnosis Date   Allergy    seasonal rhinitis   Arthritis    right shoulder,  no intervention   Cancer ([place])    basal cell    Chronic right maxillary sinusitis 08/13/2018   GERD (gastroesophageal reflux disease)    Hyperlipidemia    Hypertension    Irritable bowel syndrome (IBS) Oct 2011   last colonoscopy showed diverticulum, no polyps   Neuropathy    FEET   Thrombophlebitis of left lower extremity ([place]) 09/29/2019   Vertigo    OCCAS    Past Surgical History:  Procedure Laterality Date   BREAST BIOPSY Right 03/12/2019   [place] bx neg   CATARACT EXTRACTION W/PHACO Left 04/09/2017   Procedure: CATARACT EXTRACTION PHACO AND INTRAOCULAR LENS PLACEMENT ([place]);  Surgeon: Birder Robson, MD;  Location: [facility] ORS;  Service: Ophthalmology;  Laterality: Left;  [place] 00:29.6 AP% 18.4 CDE 5.44 FLUID PACK LOT # [id number]   CATARACT EXTRACTION W/PHACO Right 04/30/2017   Procedure: CATARACT EXTRACTION PHACO AND INTRAOCULAR LENS PLACEMENT (IOC);  Surgeon: Birder Robson, MD;  Location: [facility] ORS;  Service: Ophthalmology;  Laterality: Right;  [place] 00:43.1 AP% 13.7 CDE 5.91 Fluid Pack Lot # [id number] H   right breast bx  03/12/2019   TONSILLECTOMY AND ADENOIDECTOMY  1956   TUBAL LIGATION  1977   VEIN SURGERY      Family History  Problem Relation Age of Onset   Hyperlipidemia Mother     Hyperlipidemia Father    Hypertension Father    Hyperlipidemia Maternal Grandmother    Hyperlipidemia Maternal Grandfather    Cancer Cousin    Breast cancer Sister 85    Social History   Socioeconomic History   Marital status: Divorced    Spouse name: Not on file   Number of children: Not on file   Years of education: Not on file   Highest education level: Not on file  Occupational History   Occupation: cutsodial    Employer: [employer]    Comment: works at [employer]  Tobacco Use   Smoking status: Former    Types: Cigarettes    Quit date: 03/16/1999    Years since quitting: 21.8   Smokeless tobacco: Never  Vaping Use   Vaping Use: Never used  Substance and Sexual Activity   Alcohol use: No    Alcohol/week: 0.0 standard drinks   Drug use: No   Sexual activity: Not Currently  Other Topics Concern   Not on file  Social History Narrative   Not on file   Social Determinants of Health   Financial Resource Strain: Low Risk    Difficulty of Paying Living Expenses: Not hard at all  Food Insecurity: No Food Insecurity   Worried About Estate manager/land agent of Food in the Last Year: Never true  Ran Out of Food in the Last Year: Never true  Transportation Needs: No Transportation Needs   Lack of Transportation (Medical): No   Lack of Transportation (Non-Medical): No  Physical Activity: Not on file  Stress: No Stress Concern Present   Feeling of Stress : Not at all  Social Connections: Unknown   Frequency of Communication with Friends and Family: More than three times a week   Frequency of Social Gatherings with Friends and Family: More than three times a week   Attends Religious Services: Not on Electrical engineer or Organizations: Not on file   Attends Archivist Meetings: Not on file   Marital Status: Not on file  Intimate Partner Violence: Not At Risk   Fear of Current or Ex-Partner: No   Emotionally Abused: No   Physically Abused: No   Sexually Abused: No     Outpatient Medications Prior to Visit  Medication Sig Dispense Refill   albuterol (VENTOLIN HFA) 108 (90 Base) MCG/ACT inhaler SMARTSIG:2 Puff(s) Via Inhaler 4 Times Daily PRN     amLODipine (NORVASC) 2.5 MG tablet TAKE ONE TABLET BY MOUTH DAILY 30 tablet 1   aspirin 81 MG tablet Take 81 mg by mouth daily.     cefdinir (OMNICEF) 300 MG capsule Take 300 mg by mouth 2 (two) times daily.     cetirizine (ZYRTEC) 10 MG tablet Take 10 mg by mouth at bedtime.     Cholecalciferol (VITAMIN D3) 1000 UNITS CAPS Take 1,000 Units by mouth daily.     estradiol (ESTRACE VAGINAL) 0.1 MG/GM vaginal cream Place 0.5 g vaginally 3 (three) times a week. 42.5 g 2   famotidine (PEPCID) 10 MG tablet Take 10 mg by mouth daily. As needed     Fluocinolone Acetonide 0.01 % OIL Apply 1 application topically See admin instructions. Apply to affected ear daily as needed for dermatisis     fluticasone (FLONASE) 50 MCG/ACT nasal spray PLACE 2 SPRAYS INTO THE NOSE DAILY. 16 g 4   NON FORMULARY Apply 1 application topically daily as needed (for dermatisis on face). Hydrocortisone 2.5%/Ketoconazole 2% Cream     nystatin (MYCOSTATIN/NYSTOP) powder Apply topically 2 (two) times daily. To rash until resolved. 15 g 0   Psyllium (METAMUCIL FIBER PO) Take by mouth. Take 1 tablet daily.     simvastatin (ZOCOR) 40 MG tablet TAKE ONE TABLET BY MOUTH EVERY NIGHT AT BEDTIME 30 tablet 1   telmisartan (MICARDIS) 80 MG tablet TAKE ONE TABLET BY MOUTH EVERY EVENING 30 tablet 2   No facility-administered medications prior to visit.    Allergies  Allergen Reactions   Doxycycline Nausea And Vomiting   Sulfa Drugs Cross Reactors Rash   Tetanus Toxoids Rash    Review of Systems  Constitutional:  Negative for fever.  Respiratory:  Positive for cough and wheezing.   Cardiovascular: Negative.   Skin: Negative.   Neurological:  Positive for light-headedness.  All other systems reviewed and are negative.     Objective:    Physical  Exam Vitals and nursing note reviewed.  Constitutional:      Appearance: Normal appearance.  HENT:     Right Ear: Tympanic membrane and ear canal normal.     Left Ear: Tympanic membrane and ear canal normal.     Nose: Nose normal.  Cardiovascular:     Rate and Rhythm: Normal rate and regular rhythm.  Pulmonary:     Effort: Pulmonary effort is normal. No respiratory distress.  Breath sounds: Normal breath sounds. No stridor.  Musculoskeletal:        General: Normal range of motion.     Cervical back: Normal range of motion and neck supple.  Skin:    General: Skin is warm and dry.  Neurological:     General: No focal deficit present.     Mental Status: She is alert and oriented to person, place, and time.  Psychiatric:        Mood and Affect: Mood normal.        Behavior: Behavior normal.    BP 130/72   Pulse 65   Temp 97.7 F (36.5 C) (Oral)   Resp 18   Ht 5\' 5"  (1.651 m)   Wt 207 lb 8 oz (94.1 kg)   SpO2 98%   BMI 34.53 kg/m  Wt Readings from Last 3 Encounters:  01/17/21 207 lb 8 oz (94.1 kg)  12/20/20 206 lb 11.2 oz (93.8 kg)  10/19/20 206 lb 9.6 oz (93.7 kg)    There are no preventive care reminders to display for this patient.  There are no preventive care reminders to display for this patient.   Lab Results  Component Value Date   TSH 0.93 04/20/2020   Lab Results  Component Value Date   WBC 8.2 10/19/2020   HGB 12.4 10/19/2020   HCT 36.9 10/19/2020   MCV 89.4 10/19/2020   PLT 199.0 10/19/2020   Lab Results  Component Value Date   NA 139 10/19/2020   K 4.5 10/19/2020   CO2 23 10/19/2020   GLUCOSE 83 10/19/2020   BUN 21 10/19/2020   CREATININE 1.14 10/19/2020   BILITOT 0.6 10/19/2020   ALKPHOS 68 10/19/2020   AST 14 10/19/2020   ALT 10 10/19/2020   PROT 6.5 10/19/2020   ALBUMIN 4.2 10/19/2020   CALCIUM 9.5 10/19/2020   CALCIUM 9.4 10/19/2020   GFR 48.53 (L) 10/19/2020   Lab Results  Component Value Date   CHOL 146 10/19/2020   Lab  Results  Component Value Date   HDL 53.60 10/19/2020   Lab Results  Component Value Date   LDLCALC 57 10/19/2020   Lab Results  Component Value Date   TRIG 178.0 (H) 10/19/2020   Lab Results  Component Value Date   CHOLHDL 3 10/19/2020   Lab Results  Component Value Date   HGBA1C 5.7 10/19/2020       Assessment & Plan:   Problem List Items Addressed This Visit   None Visit Diagnoses     Acute bronchitis, unspecified organism    -  Primary   Relevant Orders   DG Chest 2 View   Acute cough       Relevant Orders   DG Chest 2 View        Meds ordered this encounter  Medications   predniSONE (STERAPRED UNI-PAK 21 TAB) 10 MG (21) TBPK tablet    Sig: As needed    Dispense:  21 tablet    Refill:  0   Vianny was seen today for cough.  Diagnoses and all orders for this visit:  Acute bronchitis, unspecified organism -     DG Chest 2 View; Future  Acute cough -     DG Chest 2 View; Future  Other orders -     predniSONE (STERAPRED UNI-PAK 21 TAB) 10 MG (21) TBPK tablet; As needed   Call the office if symptoms worsen or persist. Will f/u pending xray and sooner as needed.  Kennyth Arnold, FNP

## 2021-01-18 ENCOUNTER — Other Ambulatory Visit: Payer: Self-pay

## 2021-01-18 ENCOUNTER — Other Ambulatory Visit: Payer: Self-pay | Admitting: Family

## 2021-01-18 MED ORDER — LEVOFLOXACIN 750 MG PO TABS: 750.0000 mg | ORAL_TABLET | Freq: Every day | ORAL | 0 refills | Status: DC

## 2021-02-02 ENCOUNTER — Other Ambulatory Visit: Payer: Self-pay | Admitting: Internal Medicine

## 2021-02-16 ENCOUNTER — Ambulatory Visit (INDEPENDENT_AMBULATORY_CARE_PROVIDER_SITE_OTHER): Payer: Medicare Other

## 2021-02-16 ENCOUNTER — Other Ambulatory Visit: Payer: Self-pay

## 2021-02-16 DIAGNOSIS — E538 Deficiency of other specified B group vitamins: Secondary | ICD-10-CM | POA: Diagnosis not present

## 2021-02-16 MED ORDER — CYANOCOBALAMIN 1000 MCG/ML IJ SOLN
1000.0000 ug | Freq: Once | INTRAMUSCULAR | Status: AC
  Administered 2021-02-16: 09:00:00 1000 ug via INTRAMUSCULAR

## 2021-02-16 NOTE — Progress Notes (Signed)
Patient presented for B 12 injection to right deltoid, patient voiced no concerns nor showed any signs of distress during injection. 

## 2021-03-01 ENCOUNTER — Ambulatory Visit (INDEPENDENT_AMBULATORY_CARE_PROVIDER_SITE_OTHER): Payer: Medicare Other

## 2021-03-01 VITALS — Ht 65.0 in | Wt 207.0 lb

## 2021-03-01 DIAGNOSIS — Z1231 Encounter for screening mammogram for malignant neoplasm of breast: Secondary | ICD-10-CM | POA: Diagnosis not present

## 2021-03-01 DIAGNOSIS — Z0000 Encounter for general adult medical examination without abnormal findings: Secondary | ICD-10-CM

## 2021-03-01 NOTE — Progress Notes (Addendum)
Subjective:   Jillian [name] is a 71 y.o. female who presents for Medicare Annual (Subsequent) preventive examination.  Review of Systems    No ROS.  Medicare Wellness Virtual Visit.  Visual/audio telehealth visit, UTA vital signs.   See social history for additional risk factors.   Cardiac Risk Factors include: advanced age (>20men, >59 women)     Objective:    Today's Vitals   03/01/21 0905  Weight: 207 lb (93.9 kg)  Height: 5\' 5"  (1.651 m)   Body mass index is 34.45 kg/m.  Advanced Directives 03/01/2021 02/29/2020 09/25/2019 02/25/2019 02/21/2018 04/09/2017 02/14/2017  Does Patient Have a Medical Advance Directive? Yes Yes No Yes Yes Yes Yes  Type of Advance Directive Healthcare Power of [place]  Does patient want to make changes to medical advance directive? No - Patient declined No - Patient declined - No - Patient declined No - Patient declined No - Patient declined No - Patient declined  Copy of [place] in Chart? No - copy requested - - No - copy requested No - copy requested No - copy requested No - copy requested    Current Medications (verified) Outpatient Encounter Medications as of 03/01/2021  Medication Sig   albuterol (VENTOLIN HFA) 108 (90 Base) MCG/ACT inhaler SMARTSIG:2 Puff(s) Via Inhaler 4 Times Daily PRN   amLODipine (NORVASC) 2.5 MG tablet TAKE ONE TABLET BY MOUTH DAILY   aspirin 81 MG tablet Take 81 mg by mouth daily.   cetirizine (ZYRTEC) 10 MG tablet Take 10 mg by mouth at bedtime.   Cholecalciferol (VITAMIN D3) 1000 UNITS CAPS Take 1,000 Units by mouth daily.   estradiol (ESTRACE VAGINAL) 0.1 MG/GM vaginal cream Place 0.5 g vaginally 3 (three) times a week.   famotidine (PEPCID) 10 MG tablet Take 10 mg by mouth daily. As needed   Fluocinolone Acetonide 0.01 % OIL  Apply 1 application topically See admin instructions. Apply to affected ear daily as needed for dermatisis   fluticasone (FLONASE) 50 MCG/ACT nasal spray PLACE 2 SPRAYS INTO THE NOSE DAILY.   levofloxacin (LEVAQUIN) 750 MG tablet Take 1 tablet (750 mg total) by mouth daily.   NON FORMULARY Apply 1 application topically daily as needed (for dermatisis on face). Hydrocortisone 2.5%/Ketoconazole 2% Cream   nystatin (MYCOSTATIN/NYSTOP) powder Apply topically 2 (two) times daily. To rash until resolved.   predniSONE (STERAPRED UNI-PAK 21 TAB) 10 MG (21) TBPK tablet As needed   Psyllium (METAMUCIL FIBER PO) Take by mouth. Take 1 tablet daily.   simvastatin (ZOCOR) 40 MG tablet TAKE ONE TABLET BY MOUTH EVERY NIGHT AT BEDTIME   telmisartan (MICARDIS) 80 MG tablet TAKE ONE TABLET BY MOUTH EVERY EVENING   No facility-administered encounter medications on file as of 03/01/2021.    Allergies (verified) Doxycycline, Sulfa drugs cross reactors, and Tetanus toxoids   History: Past Medical History:  Diagnosis Date   Allergy    seasonal rhinitis   Arthritis    right shoulder,  no intervention   Cancer ([facility])    basal cell    Chronic right maxillary sinusitis 08/13/2018   GERD (gastroesophageal reflux disease)    Hyperlipidemia    Hypertension    Irritable bowel syndrome (IBS) Oct 2011   last colonoscopy showed diverticulum, no polyps   Neuropathy    FEET   Thrombophlebitis of left lower extremity ([place]) 09/29/2019  Vertigo    OCCAS   Past Surgical History:  Procedure Laterality Date   BREAST BIOPSY Right 03/12/2019   [place] bx neg   CATARACT EXTRACTION W/PHACO Left 04/09/2017   Procedure: CATARACT EXTRACTION PHACO AND INTRAOCULAR LENS PLACEMENT (IOC);  Surgeon: Birder Robson, MD;  Location: [facility] ORS;  Service: Ophthalmology;  Laterality: Left;  [place] 00:29.6 AP% 18.4 CDE 5.44 FLUID PACK LOT # [id number] H   CATARACT EXTRACTION W/PHACO Right 04/30/2017   Procedure: CATARACT EXTRACTION PHACO AND  INTRAOCULAR LENS PLACEMENT (IOC);  Surgeon: Birder Robson, MD;  Location: [facility] ORS;  Service: Ophthalmology;  Laterality: Right;  [place] 00:43.1 AP% 13.7 CDE 5.91 Fluid Pack Lot # [id number] H   right breast bx  03/12/2019   TONSILLECTOMY AND ADENOIDECTOMY  1956   TUBAL LIGATION  1977   VEIN SURGERY     Family History  Problem Relation Age of Onset   Hyperlipidemia Mother    Hyperlipidemia Father    Hypertension Father    Hyperlipidemia Maternal Grandmother    Hyperlipidemia Maternal Grandfather    Cancer Cousin    Breast cancer Sister 54   Social History   Socioeconomic History   Marital status: Divorced    Spouse name: Not on file   Number of children: Not on file   Years of education: Not on file   Highest education level: Not on file  Occupational History   Occupation: cutsodial    Employer: [employer]    Comment: works at [employer]  Tobacco Use   Smoking status: Former    Types: Cigarettes    Quit date: 03/16/1999    Years since quitting: 21.9   Smokeless tobacco: Never  Vaping Use   Vaping Use: Never used  Substance and Sexual Activity   Alcohol use: No    Alcohol/week: 0.0 standard drinks   Drug use: No   Sexual activity: Not Currently  Other Topics Concern   Not on file  Social History Narrative   Not on file   Social Determinants of Health   Financial Resource Strain: Not on file  Food Insecurity: Not on file  Transportation Needs: Not on file  Physical Activity: Not on file  Stress: Not on file  Social Connections: Not on file    Tobacco Counseling Counseling given: Not Answered   Clinical Intake:  Pre-visit preparation completed: Yes        Diabetes: No  How often do you need to have someone help you when you read instructions, pamphlets, or other written materials from your doctor or pharmacy?: 1 - Never    Interpreter Needed?: No      Activities of Daily Living In your present state of health, do you have any difficulty performing the  following activities: 03/01/2021  Hearing? N  Vision? N  Difficulty concentrating or making decisions? N  Walking or climbing stairs? N  Dressing or bathing? N  Doing errands, shopping? N  Preparing Food and eating ? N  Using the Toilet? N  In the past six months, have you accidently leaked urine? N  Do you have problems with loss of bowel control? N  Managing your Medications? N  Managing your Finances? N  Housekeeping or managing your Housekeeping? N  Some recent data might be hidden    Patient Care Team: Crecencio Mc, MD as PCP - General (Internal Medicine)  Indicate any recent Medical Services you may have received from other than [facility] providers in the past year (date may be approximate).  Assessment:   This is a routine wellness examination for [facility].  Virtual Visit via Telephone Note  I connected with  Belkis Norbeck Bublitz on 03/01/21 at  9:00 AM EST by telephone and verified that I am speaking with the correct person using two identifiers.  Persons participating in the virtual visit: patient/Nurse Health Advisor   I discussed the limitations, risks, security and privacy concerns of performing an evaluation and management service by telephone and the availability of in person appointments. The patient expressed understanding and agreed to proceed.  Interactive audio and video telecommunications were attempted between this nurse and patient, however failed, due to patient having technical difficulties OR patient did not have access to video capability.  We continued and completed visit with audio only.  Some vital signs may be absent or patient reported.   Hearing/Vision screen Hearing Screening - Comments:: Patient is able to hear conversational tones without difficulty. No issues reported. Vision Screening - Comments:: Followed by [facility] Wears corrective lenses Cataract extraction, bilateral  Dietary issues and exercise activities  discussed: Current Exercise Habits: Home exercise routine, Intensity: Mild Regular diet Good water intake   Goals Addressed               This Visit's Progress     Patient Stated     Increase physical activity (pt-stated)        Resume water aerobics        Depression Screen PHQ 2/9 Scores 03/01/2021 01/17/2021 10/19/2020 04/20/2020 02/29/2020 02/25/2019 02/21/2018  PHQ - 2 Score 0 0 0 0 0 0 0  PHQ- 9 Score - - - - - - -    Fall Risk Fall Risk  03/01/2021 01/17/2021 10/19/2020 04/20/2020 03/10/2020  Falls in the past year? 0 0 0 0 0  Number falls in past yr: 0 0 - 0 0  Injury with Fall? - 0 - 0 0  Risk for fall due to : - No Fall Risks - - -  Follow up Falls evaluation completed Falls evaluation completed Falls evaluation completed Falls evaluation completed Falls evaluation completed    [place]: Home free of loose throw rugs in walkways, pet beds, electrical cords, etc? Yes  Adequate lighting in your home to reduce risk of falls? Yes   ASSISTIVE DEVICES UTILIZED TO PREVENT FALLS: Life alert? No  Use of a cane, walker or w/c? No   TIMED UP AND GO: Was the test performed? No .   Cognitive Function: Patient is alert and oriented x3. MMSE - Mini Mental State Exam 02/14/2017 02/07/2016  Orientation to time 5 5  Orientation to Place 5 5  Registration 3 3  Attention/ Calculation 5 5  Recall 3 3  Language- name 2 objects 2 2  Language- repeat 1 1  Language- follow 3 step command 3 3  Language- read & follow direction 1 1  Write a sentence 1 1  Copy design 1 1  Total score 30 30     6CIT Screen 02/29/2020 02/25/2019 02/21/2018  What Year? 0 points 0 points 0 points  What month? 0 points 0 points 0 points  What time? 0 points 0 points 0 points  Count back from 20 0 points 0 points 0 points  Months in reverse 0 points 0 points 0 points  Repeat phrase - 0 points 0 points  Total Score - 0 0    Immunizations Immunization History   Administered Date(s) Administered   Fluad  Quad(high Dose 65+) 11/24/2018, 12/01/2019   Hep A / Hep B 01/09/2017, 02/14/2017, 07/09/2017   Influenza Split 11/28/2020   Influenza, High Dose Seasonal PF 11/29/2014, 12/27/2015, 11/20/2016   Influenza,inj,quad, With Preservative 12/06/2017   Influenza-Unspecified 11/18/2011, 11/10/2012, 11/25/2013, 12/04/2017   Moderna Sars-Covid-2 Vaccination 04/07/2019, 05/05/2019, 12/26/2019, 12/12/2020   Pneumococcal Conjugate-13 11/29/2014   Pneumococcal Polysaccharide-23 02/29/2016   Zoster Recombinat (Shingrix) 09/22/2018, 12/01/2018, 12/21/2018   Screening Tests Health Maintenance  Topic Date Due   COVID-19 Vaccine (5 - Booster for Moderna series) 03/17/2021 (Originally 02/06/2021)   MAMMOGRAM  03/16/2021   Fecal DNA (Cologuard)  09/04/2022   Pneumonia Vaccine 79+ Years old  Completed   INFLUENZA VACCINE  Completed   DEXA SCAN  Completed   Hepatitis C Screening  Completed   Zoster Vaccines- Shingrix  Completed   HPV VACCINES  Aged Out   COLONOSCOPY (Pts 45-76yrs Insurance coverage will need to be confirmed)  Discontinued   Health Maintenance There are no preventive care reminders to display for this patient.  Mammogram status: Completed 02/2020. Repeat every year. Ordered.   Lung Cancer Screening: (Low Dose CT Chest recommended if Age 30-80 years, 30 pack-year currently smoking OR have quit w/in 15years.) does not qualify.   Vision Screening: Recommended annual ophthalmology exams for early detection of glaucoma and other disorders of the eye.  Dental Screening: Recommended annual dental exams for proper oral hygiene  Community Resource Referral / Chronic Care Management: CRR required this visit?  No   CCM required this visit?  No      Plan:   Keep all routine maintenance appointments.   I have personally reviewed and noted the following in the patients chart:   Medical and social history Use of alcohol, tobacco or illicit drugs   Current medications and supplements including opioid prescriptions. Not taking opioid.  Functional ability and status Nutritional status Physical activity Advanced directives List of other physicians Hospitalizations, surgeries, and ER visits in previous 12 months Vitals Screenings to include cognitive, depression, and falls Referrals and appointments  In addition, I have reviewed and discussed with patient certain preventive protocols, quality metrics, and best practice recommendations. A written personalized care plan for preventive services as well as general preventive health recommendations were provided to patient.     OBrien-Blaney, Bryson Gavia L, LPN   11/02/2834    I have reviewed the above information and agree with above.   Deborra Medina, MD

## 2021-03-01 NOTE — Patient Instructions (Addendum)
Jillian [name] , Thank you for taking time to come for your Medicare Wellness Visit. I appreciate your ongoing commitment to your health goals. Please review the following plan we discussed and let me know if I can assist you in the future.   These are the goals we discussed:  Goals       Patient Stated     Increase physical activity (pt-stated)      Resume water aerobics         This is a list of the screening recommended for you and due dates:  Health Maintenance  Topic Date Due   COVID-19 Vaccine (5 - Booster for Moderna series) 03/17/2021*   Mammogram  03/16/2021   Cologuard (Stool DNA test)  09/04/2022   Pneumonia Vaccine  Completed   Flu Shot  Completed   DEXA scan (bone density measurement)  Completed   Hepatitis C Screening: USPSTF Recommendation to screen - Ages 73-79 yo.  Completed   Zoster (Shingles) Vaccine  Completed   HPV Vaccine  Aged Out   Colon Cancer Screening  Discontinued  *Topic was postponed. The date shown is not the original due date.     Advanced directives: End of life planning; Advance aging; Advanced directives discussed.  Copy of current HCPOA/Living Will requested.    Conditions/risks identified: none new.  Follow up in one year for your annual wellness visit.   Preventive Care 44 Years and Older, Female Preventive care refers to lifestyle choices and visits with your health care provider that can promote health and wellness. What does preventive care include? A yearly physical exam. This is also called an annual well check. Dental exams once or twice a year. Routine eye exams. Ask your health care provider how often you should have your eyes checked. Personal lifestyle choices, including: Daily care of your teeth and gums. Regular physical activity. Eating a healthy diet. Avoiding tobacco and drug use. Limiting alcohol use. Practicing safe sex. Taking low-dose aspirin every day. Taking vitamin and mineral supplements as recommended by  your health care provider. What happens during an annual well check? The services and screenings done by your health care provider during your annual well check will depend on your age, overall health, lifestyle risk factors, and family history of disease. Counseling  Your health care provider may ask you questions about your: Alcohol use. Tobacco use. Drug use. Emotional well-being. Home and relationship well-being. Sexual activity. Eating habits. History of falls. Memory and ability to understand (cognition). Work and work Statistician. Reproductive health. Screening  You may have the following tests or measurements: Height, weight, and BMI. Blood pressure. Lipid and cholesterol levels. These may be checked every 5 years, or more frequently if you are over 51 years old. Skin check. Lung cancer screening. You may have this screening every year starting at age 88 if you have a 30-pack-year history of smoking and currently smoke or have quit within the past 15 years. Fecal occult blood test (FOBT) of the stool. You may have this test every year starting at age 65. Flexible sigmoidoscopy or colonoscopy. You may have a sigmoidoscopy every 5 years or a colonoscopy every 10 years starting at age 53. Hepatitis C blood test. Hepatitis B blood test. Sexually transmitted disease (STD) testing. Diabetes screening. This is done by checking your blood sugar (glucose) after you have not eaten for a while (fasting). You may have this done every 1-3 years. Bone density scan. This is done to screen for osteoporosis. You may  have this done starting at age 16. Mammogram. This may be done every 1-2 years. Talk to your health care provider about how often you should have regular mammograms. Talk with your health care provider about your test results, treatment options, and if necessary, the need for more tests. Vaccines  Your health care provider may recommend certain vaccines, such as: Influenza  vaccine. This is recommended every year. Tetanus, diphtheria, and acellular pertussis (Tdap, Td) vaccine. You may need a Td booster every 10 years. Zoster vaccine. You may need this after age 59. Pneumococcal 13-valent conjugate (PCV13) vaccine. One dose is recommended after age 35. Pneumococcal polysaccharide (PPSV23) vaccine. One dose is recommended after age 80. Talk to your health care provider about which screenings and vaccines you need and how often you need them. This information is not intended to replace advice given to you by your health care provider. Make sure you discuss any questions you have with your health care provider. Document Released: 03/11/2015 Document Revised: 11/02/2015 Document Reviewed: 12/14/2014 Elsevier Interactive Patient Education  2017 [employer] Prevention in the Home Falls can cause injuries. They can happen to people of all ages. There are many things you can do to make your home safe and to help prevent falls. What can I do on the outside of my home? Regularly fix the edges of walkways and driveways and fix any cracks. Remove anything that might make you trip as you walk through a door, such as a raised step or threshold. Trim any bushes or trees on the path to your home. Use bright outdoor lighting. Clear any walking paths of anything that might make someone trip, such as rocks or tools. Regularly check to see if handrails are loose or broken. Make sure that both sides of any steps have handrails. Any raised decks and porches should have guardrails on the edges. Have any leaves, snow, or ice cleared regularly. Use sand or salt on walking paths during winter. Clean up any spills in your garage right away. This includes oil or grease spills. What can I do in the bathroom? Use night lights. Install grab bars by the toilet and in the tub and shower. Do not use towel bars as grab bars. Use non-skid mats or decals in the tub or shower. If you  need to sit down in the shower, use a plastic, non-slip stool. Keep the floor dry. Clean up any water that spills on the floor as soon as it happens. Remove soap buildup in the tub or shower regularly. Attach bath mats securely with double-sided non-slip rug tape. Do not have throw rugs and other things on the floor that can make you trip. What can I do in the bedroom? Use night lights. Make sure that you have a light by your bed that is easy to reach. Do not use any sheets or blankets that are too big for your bed. They should not hang down onto the floor. Have a firm chair that has side arms. You can use this for support while you get dressed. Do not have throw rugs and other things on the floor that can make you trip. What can I do in the kitchen? Clean up any spills right away. Avoid walking on wet floors. Keep items that you use a lot in easy-to-reach places. If you need to reach something above you, use a strong step stool that has a grab bar. Keep electrical cords out of the way. Do not use floor polish  or wax that makes floors slippery. If you must use wax, use non-skid floor wax. Do not have throw rugs and other things on the floor that can make you trip. What can I do with my stairs? Do not leave any items on the stairs. Make sure that there are handrails on both sides of the stairs and use them. Fix handrails that are broken or loose. Make sure that handrails are as long as the stairways. Check any carpeting to make sure that it is firmly attached to the stairs. Fix any carpet that is loose or worn. Avoid having throw rugs at the top or bottom of the stairs. If you do have throw rugs, attach them to the floor with carpet tape. Make sure that you have a light switch at the top of the stairs and the bottom of the stairs. If you do not have them, ask someone to add them for you. What else can I do to help prevent falls? Wear shoes that: Do not have high heels. Have rubber  bottoms. Are comfortable and fit you well. Are closed at the toe. Do not wear sandals. If you use a stepladder: Make sure that it is fully opened. Do not climb a closed stepladder. Make sure that both sides of the stepladder are locked into place. Ask someone to hold it for you, if possible. Clearly mark and make sure that you can see: Any grab bars or handrails. First and last steps. Where the edge of each step is. Use tools that help you move around (mobility aids) if they are needed. These include: Canes. Walkers. Scooters. Crutches. Turn on the lights when you go into a dark area. Replace any light bulbs as soon as they burn out. Set up your furniture so you have a clear path. Avoid moving your furniture around. If any of your floors are uneven, fix them. If there are any pets around you, be aware of where they are. Review your medicines with your doctor. Some medicines can make you feel dizzy. This can increase your chance of falling. Ask your doctor what other things that you can do to help prevent falls. This information is not intended to replace advice given to you by your health care provider. Make sure you discuss any questions you have with your health care provider. Document Released: 12/09/2008 Document Revised: 07/21/2015 Document Reviewed: 03/19/2014 Elsevier Interactive Patient Education  2017 [employer].

## 2021-03-06 ENCOUNTER — Encounter: Payer: Self-pay | Admitting: Internal Medicine

## 2021-03-08 ENCOUNTER — Other Ambulatory Visit: Payer: Self-pay

## 2021-03-08 ENCOUNTER — Ambulatory Visit (INDEPENDENT_AMBULATORY_CARE_PROVIDER_SITE_OTHER): Payer: Medicare Other

## 2021-03-08 ENCOUNTER — Other Ambulatory Visit (INDEPENDENT_AMBULATORY_CARE_PROVIDER_SITE_OTHER): Payer: Medicare Other

## 2021-03-08 DIAGNOSIS — R7303 Prediabetes: Secondary | ICD-10-CM | POA: Diagnosis not present

## 2021-03-08 DIAGNOSIS — J209 Acute bronchitis, unspecified: Secondary | ICD-10-CM

## 2021-03-08 DIAGNOSIS — R051 Acute cough: Secondary | ICD-10-CM | POA: Diagnosis not present

## 2021-03-08 NOTE — Addendum Note (Signed)
Addended by: Crecencio Mc on: 03/08/2021 01:17 PM   Modules accepted: Level of Service

## 2021-03-08 NOTE — Telephone Encounter (Signed)
Can you look at this patient's situation. Do I need to call the insurance carrier?

## 2021-03-21 ENCOUNTER — Other Ambulatory Visit: Payer: Self-pay

## 2021-03-21 ENCOUNTER — Ambulatory Visit (INDEPENDENT_AMBULATORY_CARE_PROVIDER_SITE_OTHER): Payer: Medicare Other

## 2021-03-21 DIAGNOSIS — E538 Deficiency of other specified B group vitamins: Secondary | ICD-10-CM | POA: Diagnosis not present

## 2021-03-21 MED ORDER — CYANOCOBALAMIN 1000 MCG/ML IJ SOLN
1000.0000 ug | Freq: Once | INTRAMUSCULAR | Status: AC
  Administered 2021-03-21: 16:00:00 1000 ug via INTRAMUSCULAR

## 2021-03-21 NOTE — Progress Notes (Signed)
Jillian [name] presents today for injection per MD orders. B12 injection administered IM in left Upper Arm. Administration without incident. Patient tolerated well.  Les Longmore,cma

## 2021-03-22 ENCOUNTER — Other Ambulatory Visit: Payer: Self-pay | Admitting: Internal Medicine

## 2021-03-22 DIAGNOSIS — N6001 Solitary cyst of right breast: Secondary | ICD-10-CM

## 2021-03-30 ENCOUNTER — Other Ambulatory Visit: Payer: Self-pay | Admitting: Internal Medicine

## 2021-04-07 ENCOUNTER — Ambulatory Visit
Admission: RE | Admit: 2021-04-07 | Discharge: 2021-04-07 | Disposition: A | Discharge status: Home / Self Care | Payer: Medicare Other | Source: Ambulatory Visit | Attending: Internal Medicine | Admitting: Internal Medicine

## 2021-04-07 ENCOUNTER — Other Ambulatory Visit: Payer: Self-pay

## 2021-04-07 DIAGNOSIS — N6001 Solitary cyst of right breast: Secondary | ICD-10-CM

## 2021-04-07 DIAGNOSIS — R922 Inconclusive mammogram: Secondary | ICD-10-CM | POA: Diagnosis not present

## 2021-04-20 ENCOUNTER — Telehealth: Payer: Self-pay

## 2021-04-20 DIAGNOSIS — I10 Essential (primary) hypertension: Secondary | ICD-10-CM

## 2021-04-20 DIAGNOSIS — E782 Mixed hyperlipidemia: Secondary | ICD-10-CM

## 2021-04-20 DIAGNOSIS — R7303 Prediabetes: Secondary | ICD-10-CM

## 2021-04-20 NOTE — Telephone Encounter (Signed)
LM for pre-visit screening for appt.

## 2021-04-21 ENCOUNTER — Other Ambulatory Visit: Payer: Self-pay

## 2021-04-21 ENCOUNTER — Ambulatory Visit (INDEPENDENT_AMBULATORY_CARE_PROVIDER_SITE_OTHER): Payer: Medicare Other | Admitting: Internal Medicine

## 2021-04-21 VITALS — BP 128/72 | HR 62 | Temp 97.9°F | Resp 16 | Ht 66.0 in | Wt 206.0 lb

## 2021-04-21 DIAGNOSIS — R920 Mammographic microcalcification found on diagnostic imaging of breast: Secondary | ICD-10-CM

## 2021-04-21 DIAGNOSIS — I700 Atherosclerosis of aorta: Secondary | ICD-10-CM

## 2021-04-21 DIAGNOSIS — N1831 Chronic kidney disease, stage 3a: Secondary | ICD-10-CM | POA: Diagnosis not present

## 2021-04-21 DIAGNOSIS — E782 Mixed hyperlipidemia: Secondary | ICD-10-CM | POA: Diagnosis not present

## 2021-04-21 DIAGNOSIS — I10 Essential (primary) hypertension: Secondary | ICD-10-CM

## 2021-04-21 DIAGNOSIS — R7303 Prediabetes: Secondary | ICD-10-CM | POA: Diagnosis not present

## 2021-04-21 DIAGNOSIS — E538 Deficiency of other specified B group vitamins: Secondary | ICD-10-CM

## 2021-04-21 DIAGNOSIS — G4701 Insomnia due to medical condition: Secondary | ICD-10-CM

## 2021-04-21 LAB — HEMOGLOBIN A1C: Hgb A1c MFr Bld: 5.6 % (ref 4.6–6.5)

## 2021-04-21 LAB — COMPREHENSIVE METABOLIC PANEL
ALT: 13 U/L (ref 0–35)
AST: 16 U/L (ref 0–37)
Albumin: 4.3 g/dL (ref 3.5–5.2)
Alkaline Phosphatase: 65 U/L (ref 39–117)
BUN: 17 mg/dL (ref 6–23)
CO2: 31 mEq/L (ref 19–32)
Calcium: 9.5 mg/dL (ref 8.4–10.5)
Chloride: 103 mEq/L (ref 96–112)
Creatinine, Ser: 1.1 mg/dL (ref 0.40–1.20)
GFR: 50.48 mL/min — ABNORMAL LOW (ref >60.00)
Glucose, Bld: 89 mg/dL (ref 70–99)
Potassium: 4.4 mEq/L (ref 3.5–5.1)
Sodium: 140 mEq/L (ref 135–145)
Total Bilirubin: 0.7 mg/dL (ref 0.2–1.2)
Total Protein: 6.6 g/dL (ref 6.0–8.3)

## 2021-04-21 LAB — CBC WITH DIFFERENTIAL/PLATELET
Basophils Absolute: 0 10*3/uL (ref 0.0–0.1)
Basophils Relative: 0.4 % (ref 0.0–3.0)
Eosinophils Absolute: 0.1 10*3/uL (ref 0.0–0.7)
Eosinophils Relative: 1.1 % (ref 0.0–5.0)
HCT: 38.4 % (ref 36.0–46.0)
Hemoglobin: 12.9 g/dL (ref 12.0–15.0)
Lymphocytes Relative: 29.6 % (ref 12.0–46.0)
Lymphs Abs: 2.4 10*3/uL (ref 0.7–4.0)
MCHC: 33.7 g/dL (ref 30.0–36.0)
MCV: 88.8 fl (ref 78.0–100.0)
Monocytes Absolute: 0.6 10*3/uL (ref 0.1–1.0)
Monocytes Relative: 7.6 % (ref 3.0–12.0)
Neutro Abs: 4.9 10*3/uL (ref 1.4–7.7)
Neutrophils Relative %: 61.3 % (ref 43.0–77.0)
Platelets: 219 10*3/uL (ref 150.0–400.0)
RBC: 4.32 Mil/uL (ref 3.87–5.11)
RDW: 13.7 % (ref 11.5–15.5)
WBC: 8.1 10*3/uL (ref 4.0–10.5)

## 2021-04-21 LAB — LIPID PANEL
Cholesterol: 171 mg/dL (ref 0–200)
HDL: 49.6 mg/dL (ref >39.00)
NonHDL: 121.53
Total CHOL/HDL Ratio: 3
Triglycerides: 215 mg/dL — ABNORMAL HIGH (ref 0.0–149.0)
VLDL: 43 mg/dL — ABNORMAL HIGH (ref 0.0–40.0)

## 2021-04-21 LAB — LDL CHOLESTEROL, DIRECT: Direct LDL: 87 mg/dL

## 2021-04-21 LAB — MICROALBUMIN / CREATININE URINE RATIO
Creatinine,U: 40.1 mg/dL
Microalb Creat Ratio: 1.7 mg/g (ref 0.0–30.0)
Microalb, Ur: <0.7 mg/dL (ref 0.0–1.9)

## 2021-04-21 LAB — TSH: TSH: 2.21 u[IU]/mL (ref 0.35–5.50)

## 2021-04-21 MED ORDER — GABAPENTIN 100 MG PO CAPS: 100.0000 mg | ORAL_CAPSULE | Freq: Three times a day (TID) | ORAL | 3 refills | Status: DC

## 2021-04-21 MED ORDER — CYANOCOBALAMIN 1000 MCG/ML IJ SOLN
1000.0000 ug | Freq: Once | INTRAMUSCULAR | Status: AC
  Administered 2021-04-21: 1000 ug via INTRAMUSCULAR

## 2021-04-21 NOTE — Assessment & Plan Note (Signed)
Repeat labs show stability.  She is avoiding NSAIDs,  Nephrology follow up every 6 months . reminded to monitor BP at home with goal < 130/80  Lab Results  Component Value Date   CREATININE 1.14 10/19/2020   Lab Results  Component Value Date   NA 139 10/19/2020   K 4.5 10/19/2020   CL 106 10/19/2020   CO2 23 10/19/2020

## 2021-04-21 NOTE — Assessment & Plan Note (Signed)
Trouble initiating due to "busy mind>"  Woken up by neuropathy.  Encouraged to make a list of "things to do"  And repeat trial of gabapentin

## 2021-04-21 NOTE — Assessment & Plan Note (Signed)
Diagnosed in 2018 at time of diagnosis by CT.  She is asymptomatic  taking a statin and asa and is at goal   Lab Results  Component Value Date   CHOL 146 10/19/2020   HDL 53.60 10/19/2020   LDLCALC 57 10/19/2020   LDLDIRECT 83.0 02/26/2019   TRIG 178.0 (H) 10/19/2020   CHOLHDL 3 10/19/2020

## 2021-04-21 NOTE — Progress Notes (Signed)
Subjective:  Patient ID: Jillian [name], female    DOB: 05-18-1950  Age: 71 y.o. MRN: [id number]  CC: The primary encounter diagnosis was Mixed hyperlipidemia. Diagnoses of Prediabetes, Primary hypertension, Abnormal mammogram with microcalcification, Insomnia due to medical condition, Atherosclerosis of aorta ([place]), Stage 3a chronic kidney disease ([place]), and B12 deficiency were also pertinent to this visit.   This visit occurred during the SARS-CoV-2 public health emergency.  Safety protocols were in place, including screening questions prior to the visit, additional usage of staff PPE, and extensive cleaning of exam room while observing appropriate contact time as indicated for disinfecting solutions.    HPI MATHEA FRIELING presents for follow up on multiple chronic issues, including  Chief Complaint  Patient presents with   Chronic Kidney Disease   Hypertension   Hyperlipidemia   1) CKD:  she is avoiding NSAIDS.    2) Prediabetes:  taking cooking classes,  exercising  regularly.   3) HTN:  Patient is taking her medications as prescribed and notes no adverse effects.  Home BP readings have not been been done a  She is avoiding added salt in her diet and walking regularly about 5 times per week for exercise  .   4) Insomnia :  resuming gabapentin  Outpatient Medications Prior to Visit  Medication Sig Dispense Refill   amLODipine (NORVASC) 2.5 MG tablet TAKE ONE TABLET BY MOUTH DAILY 90 tablet 3   aspirin 81 MG tablet Take 81 mg by mouth daily.     cetirizine (ZYRTEC) 10 MG tablet Take 10 mg by mouth at bedtime.     Cholecalciferol (VITAMIN D3) 1000 UNITS CAPS Take 1,000 Units by mouth daily.     estradiol (ESTRACE VAGINAL) 0.1 MG/GM vaginal cream Place 0.5 g vaginally 3 (three) times a week. 42.5 g 2   famotidine (PEPCID) 10 MG tablet Take 10 mg by mouth daily. As needed     Fluocinolone Acetonide 0.01 % OIL Apply 1 application topically See admin instructions. Apply to  affected ear daily as needed for dermatisis     fluticasone (FLONASE) 50 MCG/ACT nasal spray PLACE 2 SPRAYS INTO THE NOSE DAILY. 16 g 4   NON FORMULARY Apply 1 application topically daily as needed (for dermatisis on face). Hydrocortisone 2.5%/Ketoconazole 2% Cream     nystatin (MYCOSTATIN/NYSTOP) powder Apply topically 2 (two) times daily. To rash until resolved. 15 g 0   Psyllium (METAMUCIL FIBER PO) Take by mouth. Take 1 tablet daily.     simvastatin (ZOCOR) 40 MG tablet TAKE ONE TABLET BY MOUTH EVERY NIGHT AT BEDTIME 90 tablet 3   telmisartan (MICARDIS) 80 MG tablet TAKE ONE TABLET BY MOUTH EVERY EVENING 90 tablet 1   albuterol (VENTOLIN HFA) 108 (90 Base) MCG/ACT inhaler SMARTSIG:2 Puff(s) Via Inhaler 4 Times Daily PRN     levofloxacin (LEVAQUIN) 750 MG tablet Take 1 tablet (750 mg total) by mouth daily. 5 tablet 0   predniSONE (STERAPRED UNI-PAK 21 TAB) 10 MG (21) TBPK tablet As needed 21 tablet 0   No facility-administered medications prior to visit.    Review of Systems;  Patient denies headache, fevers, malaise, unintentional weight loss, skin rash, eye pain, sinus congestion and sinus pain, sore throat, dysphagia,  hemoptysis , cough, dyspnea, wheezing, chest pain, palpitations, orthopnea, edema, abdominal pain, nausea, melena, diarrhea, constipation, flank pain, dysuria, hematuria, urinary  Frequency, nocturia, numbness, tingling, seizures,  Focal weakness, Loss of consciousness,  Tremor, insomnia, depression, anxiety, and suicidal ideation.  Objective:  BP 128/72    Pulse 62    Temp 97.9 F (36.6 C)    Resp 16    Ht 5' 6"  (1.676 m)    Wt 206 lb (93.4 kg)    SpO2 98%    BMI 33.25 kg/m   BP Readings from Last 3 Encounters:  04/21/21 128/72  01/17/21 130/72  12/20/20 112/62    Wt Readings from Last 3 Encounters:  04/21/21 206 lb (93.4 kg)  03/01/21 207 lb (93.9 kg)  01/17/21 207 lb 8 oz (94.1 kg)    General appearance: alert, cooperative and appears stated age Ears:  normal TM's and external ear canals both ears Throat: lips, mucosa, and tongue normal; teeth and gums normal Neck: no adenopathy, no carotid bruit, supple, symmetrical, trachea midline and thyroid not enlarged, symmetric, no tenderness/mass/nodules Back: symmetric, no curvature. ROM normal. No CVA tenderness. Lungs: clear to auscultation bilaterally Heart: regular rate and rhythm, S1, S2 normal, no murmur, click, rub or gallop Abdomen: soft, non-tender; bowel sounds normal; no masses,  no organomegaly Pulses: 2+ and symmetric Skin: Skin color, texture, turgor normal. No rashes or lesions Lymph nodes: Cervical, supraclavicular, and axillary nodes normal.  Lab Results  Component Value Date   HGBA1C 5.6 04/21/2021   HGBA1C 5.7 10/19/2020   HGBA1C 5.8 04/20/2020    Lab Results  Component Value Date   CREATININE 1.10 04/21/2021   CREATININE 1.14 10/19/2020   CREATININE 1.09 04/20/2020    Lab Results  Component Value Date   WBC 8.1 04/21/2021   HGB 12.9 04/21/2021   HCT 38.4 04/21/2021   PLT 219.0 04/21/2021   GLUCOSE 89 04/21/2021   CHOL 171 04/21/2021   TRIG 215.0 (H) 04/21/2021   HDL 49.60 04/21/2021   LDLDIRECT 87.0 04/21/2021   LDLCALC 57 10/19/2020   ALT 13 04/21/2021   AST 16 04/21/2021   NA 140 04/21/2021   K 4.4 04/21/2021   CL 103 04/21/2021   CREATININE 1.10 04/21/2021   BUN 17 04/21/2021   CO2 31 04/21/2021   TSH 2.21 04/21/2021   HGBA1C 5.6 04/21/2021   MICROALBUR <0.7 04/21/2021    US BREAST LTD UNI RIGHT INC AXILLA  Result Date: 04/07/2021 CLINICAL DATA:  Two year interval follow-up of a likely benign mass involving the outer RIGHT breast at the 9 o'clock position 4 cm from the nipple. Annual evaluation, LEFT breast. EXAM: DIGITAL DIAGNOSTIC BILATERAL MAMMOGRAM WITH TOMOSYNTHESIS AND CAD; ULTRASOUND RIGHT BREAST LIMITED TECHNIQUE: Bilateral digital diagnostic mammography and breast tomosynthesis was performed. The images were evaluated with computer-aided  detection.; Targeted ultrasound examination of the right breast was performed. COMPARISON:  Previous exam(s). ACR Breast Density Category b: There are scattered areas of fibroglandular density. FINDINGS: Full field CC and MLO views of both breasts were obtained. RIGHT: The isodense mass in the outer breast measuring approximately 6 mm is unchanged dating back to the December, 2020 examination. No new or suspicious findings elsewhere. Targeted ultrasound is performed, again demonstrating the anechoic mass with thin internal septations versus clustered cysts at the 9 o'clock position 4 cm from the nipple measuring approximately 5 x 2 x 5 mm (previously 6 x 3 x 4 mm on 03/16/2020, 6 x 4 x 5 mm on 09/10/2019 and 7 x 5 x 5 mm on 03/04/2019), demonstrating no posterior characteristics and no internal color Doppler flow. LEFT: No findings suspicious for malignancy. IMPRESSION: 1. No mammographic or sonographic evidence of malignancy involving the RIGHT breast. 2. No mammographic evidence of malignancy  involving the LEFT breast. 3. Stable complicated cyst or clustered cysts in the outer RIGHT breast dating back to January, 2021, confirming benignity. RECOMMENDATION: Screening mammogram in one year.(Code:SM-B-01Y) I have discussed the findings and recommendations with the patient. If applicable, a reminder letter will be sent to the patient regarding the next appointment. BI-RADS CATEGORY  2: Benign. Electronically Signed   By: Evangeline Dakin M.D.   On: 04/07/2021 11:32  MM DIAG BREAST TOMO BILATERAL  Result Date: 04/07/2021 CLINICAL DATA:  Two year interval follow-up of a likely benign mass involving the outer RIGHT breast at the 9 o'clock position 4 cm from the nipple. Annual evaluation, LEFT breast. EXAM: DIGITAL DIAGNOSTIC BILATERAL MAMMOGRAM WITH TOMOSYNTHESIS AND CAD; ULTRASOUND RIGHT BREAST LIMITED TECHNIQUE: Bilateral digital diagnostic mammography and breast tomosynthesis was performed. The images were  evaluated with computer-aided detection.; Targeted ultrasound examination of the right breast was performed. COMPARISON:  Previous exam(s). ACR Breast Density Category b: There are scattered areas of fibroglandular density. FINDINGS: Full field CC and MLO views of both breasts were obtained. RIGHT: The isodense mass in the outer breast measuring approximately 6 mm is unchanged dating back to the December, 2020 examination. No new or suspicious findings elsewhere. Targeted ultrasound is performed, again demonstrating the anechoic mass with thin internal septations versus clustered cysts at the 9 o'clock position 4 cm from the nipple measuring approximately 5 x 2 x 5 mm (previously 6 x 3 x 4 mm on 03/16/2020, 6 x 4 x 5 mm on 09/10/2019 and 7 x 5 x 5 mm on 03/04/2019), demonstrating no posterior characteristics and no internal color Doppler flow. LEFT: No findings suspicious for malignancy. IMPRESSION: 1. No mammographic or sonographic evidence of malignancy involving the RIGHT breast. 2. No mammographic evidence of malignancy involving the LEFT breast. 3. Stable complicated cyst or clustered cysts in the outer RIGHT breast dating back to January, 2021, confirming benignity. RECOMMENDATION: Screening mammogram in one year.(Code:SM-B-01Y) I have discussed the findings and recommendations with the patient. If applicable, a reminder letter will be sent to the patient regarding the next appointment. BI-RADS CATEGORY  2: Benign. Electronically Signed   By: Evangeline Dakin M.D.   On: 04/07/2021 11:32   Assessment & Plan:   Problem List Items Addressed This Visit     Prediabetes    She has lowered her A1c  With a low glycemic index diet and participation  in an aerobic  exercise activity.  We should check an A1c in 6 months.  Lab Results  Component Value Date   HGBA1C 5.6 04/21/2021         Relevant Orders   Hemoglobin A1c (Completed)   Microalbumin / creatinine urine ratio (Completed)   Insomnia      Trouble initiating due to "busy mind>"  Woken up by neuropathy.  Encouraged to make a list of "things to do"  And repeat trial of gabapentin       Hypertension    Well controlled on current regimen of amlodipine and telmisartan. . Renal function stable, no changes today.  Lab Results  Component Value Date   CREATININE 1.10 04/21/2021   Lab Results  Component Value Date   NA 140 04/21/2021   K 4.4 04/21/2021   CL 103 04/21/2021   CO2 31 04/21/2021         Relevant Orders   CBC with Differential/Platelet (Completed)   TSH (Completed)   Hyperlipidemia - Primary     She is asymptomatic  taking a statin  and asa and is at goal   Lab Results  Component Value Date   CHOL 171 04/21/2021   HDL 49.60 04/21/2021   LDLCALC 57 10/19/2020   LDLDIRECT 87.0 04/21/2021   TRIG 215.0 (H) 04/21/2021   CHOLHDL 3 04/21/2021         Relevant Orders   Comprehensive metabolic panel (Completed)   Lipid panel (Completed)   CKD (chronic kidney disease) stage 3, GFR 30-59 ml/min (HCC)    Repeat labs show stability.  She is avoiding NSAIDs,  Nephrology follow up every 6 months . reminded to monitor BP at home with goal < 130/80  Lab Results  Component Value Date   CREATININE 1.14 10/19/2020   Lab Results  Component Value Date   NA 139 10/19/2020   K 4.5 10/19/2020   CL 106 10/19/2020   CO2 23 10/19/2020          B12 deficiency   Atherosclerosis of aorta ([place])    Diagnosed in 2018 at time of diagnosis by CT.  She is asymptomatic  taking a statin and asa and is at goal   Lab Results  Component Value Date   CHOL 146 10/19/2020   HDL 53.60 10/19/2020   LDLCALC 57 10/19/2020   LDLDIRECT 83.0 02/26/2019   TRIG 178.0 (H) 10/19/2020   CHOLHDL 3 10/19/2020         Abnormal mammogram with microcalcification    She has a family history of BRCA in a sister who was diagnosed at age 26.  Has not had genetic counselling       I spent 20 minutes dedicated to the care of this patient  on the date of this encounter to include pre-visit review of patient's medical history,  most recent imaging studies, Face-to-face time with the patient , and post visit ordering of testing and therapeutics.    Follow-up: Return in about 6 months (around 10/19/2021).   Crecencio Mc, MD

## 2021-04-21 NOTE — Patient Instructions (Addendum)
We will resume annual mammograms  for now.  I will check into whether an MRI every other year is covered by your insurance   Please check Blood pressure once a month.  Goal is 130/80 or less   Resume a trial of gabapentin for the nighttime symptoms.  Start with 1 tablet before bed.  Increase after one week fo 2 tablets before bed if needed.  Max dose 3 tables before bed

## 2021-04-21 NOTE — Assessment & Plan Note (Signed)
She has a family history of BRCA in a sister who was diagnosed at age 72.  Has not had genetic counselling

## 2021-04-22 ENCOUNTER — Encounter: Payer: Self-pay | Admitting: Internal Medicine

## 2021-04-22 NOTE — Assessment & Plan Note (Signed)
She has lowered her A1c  With a low glycemic index diet and participation  in an aerobic  exercise activity.  We should check an A1c in 6 months.  Lab Results  Component Value Date   HGBA1C 5.6 04/21/2021

## 2021-04-22 NOTE — Assessment & Plan Note (Signed)
She is asymptomatic  taking a statin and asa and is at goal   Lab Results  Component Value Date   CHOL 171 04/21/2021   HDL 49.60 04/21/2021   LDLCALC 57 10/19/2020   LDLDIRECT 87.0 04/21/2021   TRIG 215.0 (H) 04/21/2021   CHOLHDL 3 04/21/2021

## 2021-04-22 NOTE — Assessment & Plan Note (Signed)
Well controlled on current regimen of amlodipine and telmisartan. . Renal function stable, no changes today.  Lab Results  Component Value Date   CREATININE 1.10 04/21/2021   Lab Results  Component Value Date   NA 140 04/21/2021   K 4.4 04/21/2021   CL 103 04/21/2021   CO2 31 04/21/2021

## 2021-05-19 ENCOUNTER — Other Ambulatory Visit: Payer: Self-pay

## 2021-05-19 ENCOUNTER — Ambulatory Visit (INDEPENDENT_AMBULATORY_CARE_PROVIDER_SITE_OTHER): Payer: Medicare Other | Admitting: *Deleted

## 2021-05-19 DIAGNOSIS — E538 Deficiency of other specified B group vitamins: Secondary | ICD-10-CM | POA: Diagnosis not present

## 2021-05-19 MED ORDER — CYANOCOBALAMIN 1000 MCG/ML IJ SOLN
1000.0000 ug | Freq: Once | INTRAMUSCULAR | Status: AC
  Administered 2021-05-19: 1000 ug via INTRAMUSCULAR

## 2021-05-19 NOTE — Progress Notes (Signed)
Patient presented for B 12 injection to left deltoid, patient voiced no concerns nor showed any signs of distress during injection. 

## 2021-06-12 DIAGNOSIS — D3131 Benign neoplasm of right choroid: Secondary | ICD-10-CM | POA: Diagnosis not present

## 2021-06-12 DIAGNOSIS — Z961 Presence of intraocular lens: Secondary | ICD-10-CM | POA: Diagnosis not present

## 2021-06-20 ENCOUNTER — Ambulatory Visit (INDEPENDENT_AMBULATORY_CARE_PROVIDER_SITE_OTHER): Payer: Medicare Other

## 2021-06-20 DIAGNOSIS — E538 Deficiency of other specified B group vitamins: Secondary | ICD-10-CM | POA: Diagnosis not present

## 2021-06-20 MED ORDER — CYANOCOBALAMIN 1000 MCG/ML IJ SOLN
1000.0000 ug | Freq: Once | INTRAMUSCULAR | Status: AC
  Administered 2021-06-20: 1000 ug via INTRAMUSCULAR

## 2021-06-20 NOTE — Progress Notes (Signed)
Patient presented for B 12 injection to right deltoid, patient voiced no concerns nor showed any signs of distress during injection. 

## 2021-07-10 ENCOUNTER — Encounter: Payer: Self-pay | Admitting: Internal Medicine

## 2021-07-21 ENCOUNTER — Ambulatory Visit (INDEPENDENT_AMBULATORY_CARE_PROVIDER_SITE_OTHER): Payer: Medicare Other | Admitting: *Deleted

## 2021-07-21 DIAGNOSIS — E538 Deficiency of other specified B group vitamins: Secondary | ICD-10-CM | POA: Diagnosis not present

## 2021-07-21 MED ORDER — CYANOCOBALAMIN 1000 MCG/ML IJ SOLN
1000.0000 ug | Freq: Once | INTRAMUSCULAR | Status: AC
  Administered 2021-07-21: 1000 ug via INTRAMUSCULAR

## 2021-07-21 NOTE — Progress Notes (Signed)
Pt presented for B12 injection to L Deltoid. Pt received injection with no evidence of issue or distress.

## 2021-07-30 ENCOUNTER — Other Ambulatory Visit: Payer: Self-pay | Admitting: Internal Medicine

## 2021-08-03 ENCOUNTER — Encounter: Payer: Self-pay | Admitting: Internal Medicine

## 2021-08-03 ENCOUNTER — Ambulatory Visit (INDEPENDENT_AMBULATORY_CARE_PROVIDER_SITE_OTHER): Payer: Medicare Other | Admitting: Internal Medicine

## 2021-08-03 VITALS — BP 112/66 | HR 65 | Temp 97.8°F | Ht 66.0 in | Wt 202.6 lb

## 2021-08-03 DIAGNOSIS — E538 Deficiency of other specified B group vitamins: Secondary | ICD-10-CM | POA: Diagnosis not present

## 2021-08-03 DIAGNOSIS — E6609 Other obesity due to excess calories: Secondary | ICD-10-CM

## 2021-08-03 DIAGNOSIS — R920 Mammographic microcalcification found on diagnostic imaging of breast: Secondary | ICD-10-CM

## 2021-08-03 DIAGNOSIS — E559 Vitamin D deficiency, unspecified: Secondary | ICD-10-CM | POA: Diagnosis not present

## 2021-08-03 DIAGNOSIS — N1831 Chronic kidney disease, stage 3a: Secondary | ICD-10-CM

## 2021-08-03 DIAGNOSIS — G629 Polyneuropathy, unspecified: Secondary | ICD-10-CM

## 2021-08-03 DIAGNOSIS — E782 Mixed hyperlipidemia: Secondary | ICD-10-CM | POA: Diagnosis not present

## 2021-08-03 DIAGNOSIS — N6310 Unspecified lump in the right breast, unspecified quadrant: Secondary | ICD-10-CM | POA: Diagnosis not present

## 2021-08-03 DIAGNOSIS — K760 Fatty (change of) liver, not elsewhere classified: Secondary | ICD-10-CM

## 2021-08-03 DIAGNOSIS — R7303 Prediabetes: Secondary | ICD-10-CM

## 2021-08-03 DIAGNOSIS — I700 Atherosclerosis of aorta: Secondary | ICD-10-CM | POA: Diagnosis not present

## 2021-08-03 DIAGNOSIS — Z6832 Body mass index (BMI) 32.0-32.9, adult: Secondary | ICD-10-CM

## 2021-08-03 NOTE — Assessment & Plan Note (Signed)
Repeat labs show stability.  She is avoiding NSAIDs,  Has follow up every 6 months . reminded to monitor BP at home with goal < 130/80  Lab Results  Component Value Date   CREATININE 1.10 04/21/2021   Lab Results  Component Value Date   NA 140 04/21/2021   K 4.4 04/21/2021   CL 103 04/21/2021   CO2 31 04/21/2021

## 2021-08-03 NOTE — Progress Notes (Unsigned)
Subjective:  Patient ID: Jillian [name], female    DOB: Sep 06, 1950  Age: 71 y.o. MRN: [id number]  CC: There were no encounter diagnoses.   HPI KEYA WYNES presents for  Chief Complaint  Patient presents with   Follow-up    Discuss medications   Neuropathy:  mild,  did not tolerate gabapentin trial (again)   Obesity:  she is losing  weight intentionally    using a plant based diet  and going to water aerobics       Outpatient Medications Prior to Visit  Medication Sig Dispense Refill   amLODipine (NORVASC) 2.5 MG tablet TAKE ONE TABLET BY MOUTH DAILY 90 tablet 3   aspirin 81 MG tablet Take 81 mg by mouth daily.     cetirizine (ZYRTEC) 10 MG tablet Take 10 mg by mouth at bedtime.     Cholecalciferol (VITAMIN D3) 1000 UNITS CAPS Take 1,000 Units by mouth daily.     estradiol (ESTRACE VAGINAL) 0.1 MG/GM vaginal cream Place 0.5 g vaginally 3 (three) times a week. 42.5 g 2   famotidine (PEPCID) 10 MG tablet Take 10 mg by mouth daily. As needed     Fluocinolone Acetonide 0.01 % OIL Apply 1 application topically See admin instructions. Apply to affected ear daily as needed for dermatisis     fluticasone (FLONASE) 50 MCG/ACT nasal spray PLACE 2 SPRAYS INTO THE NOSE DAILY. 16 g 4   NON FORMULARY Apply 1 application topically daily as needed (for dermatisis on face). Hydrocortisone 2.5%/Ketoconazole 2% Cream     nystatin (MYCOSTATIN/NYSTOP) powder Apply topically 2 (two) times daily. To rash until resolved. 15 g 0   Psyllium (METAMUCIL FIBER PO) Take by mouth. Take 1 tablet daily.     simvastatin (ZOCOR) 40 MG tablet TAKE ONE TABLET BY MOUTH EVERY NIGHT AT BEDTIME 90 tablet 3   telmisartan (MICARDIS) 80 MG tablet TAKE ONE TABLET BY MOUTH EVERY EVENING 90 tablet 1   gabapentin (NEURONTIN) 100 MG capsule Take 1 capsule (100 mg total) by mouth 3 (three) times daily. (Patient not taking: Reported on 08/03/2021) 90 capsule 3   No facility-administered medications prior to visit.     Review of Systems;  Patient denies headache, fevers, malaise, unintentional weight loss, skin rash, eye pain, sinus congestion and sinus pain, sore throat, dysphagia,  hemoptysis , cough, dyspnea, wheezing, chest pain, palpitations, orthopnea, edema, abdominal pain, nausea, melena, diarrhea, constipation, flank pain, dysuria, hematuria, urinary  Frequency, nocturia, numbness, tingling, seizures,  Focal weakness, Loss of consciousness,  Tremor, insomnia, depression, anxiety, and suicidal ideation.      Objective:  BP 112/66 (BP Location: Left Arm, Patient Position: Sitting, Cuff Size: Large)   Pulse 65   Temp 97.8 F (36.6 C) (Oral)   Ht '5\' 6"'$  (1.676 m)   Wt 202 lb 9.6 oz (91.9 kg)   SpO2 95%   BMI 32.70 kg/m   BP Readings from Last 3 Encounters:  08/03/21 112/66  04/21/21 128/72  01/17/21 130/72    Wt Readings from Last 3 Encounters:  08/03/21 202 lb 9.6 oz (91.9 kg)  04/21/21 206 lb (93.4 kg)  03/01/21 207 lb (93.9 kg)    General appearance: alert, cooperative and appears stated age Ears: normal TM's and external ear canals both ears Throat: lips, mucosa, and tongue normal; teeth and gums normal Neck: no adenopathy, no carotid bruit, supple, symmetrical, trachea midline and thyroid not enlarged, symmetric, no tenderness/mass/nodules Back: symmetric, no curvature. ROM normal. No CVA tenderness. Lungs:  clear to auscultation bilaterally Heart: regular rate and rhythm, S1, S2 normal, no murmur, click, rub or gallop Abdomen: soft, non-tender; bowel sounds normal; no masses,  no organomegaly Pulses: 2+ and symmetric Skin: Skin color, texture, turgor normal. No rashes or lesions Lymph nodes: Cervical, supraclavicular, and axillary nodes normal.  Lab Results  Component Value Date   HGBA1C 5.6 04/21/2021   HGBA1C 5.7 10/19/2020   HGBA1C 5.8 04/20/2020    Lab Results  Component Value Date   CREATININE 1.10 04/21/2021   CREATININE 1.14 10/19/2020   CREATININE 1.09  04/20/2020    Lab Results  Component Value Date   WBC 8.1 04/21/2021   HGB 12.9 04/21/2021   HCT 38.4 04/21/2021   PLT 219.0 04/21/2021   GLUCOSE 89 04/21/2021   CHOL 171 04/21/2021   TRIG 215.0 (H) 04/21/2021   HDL 49.60 04/21/2021   LDLDIRECT 87.0 04/21/2021   LDLCALC 57 10/19/2020   ALT 13 04/21/2021   AST 16 04/21/2021   NA 140 04/21/2021   K 4.4 04/21/2021   CL 103 04/21/2021   CREATININE 1.10 04/21/2021   BUN 17 04/21/2021   CO2 31 04/21/2021   TSH 2.21 04/21/2021   HGBA1C 5.6 04/21/2021   MICROALBUR <0.7 04/21/2021    MM DIAG BREAST TOMO BILATERAL  Result Date: 04/07/2021 CLINICAL DATA:  Two year interval follow-up of a likely benign mass involving the outer RIGHT breast at the 9 o'clock position 4 cm from the nipple. Annual evaluation, LEFT breast. EXAM: DIGITAL DIAGNOSTIC BILATERAL MAMMOGRAM WITH TOMOSYNTHESIS AND CAD; ULTRASOUND RIGHT BREAST LIMITED TECHNIQUE: Bilateral digital diagnostic mammography and breast tomosynthesis was performed. The images were evaluated with computer-aided detection.; Targeted ultrasound examination of the right breast was performed. COMPARISON:  Previous exam(s). ACR Breast Density Category b: There are scattered areas of fibroglandular density. FINDINGS: Full field CC and MLO views of both breasts were obtained. RIGHT: The isodense mass in the outer breast measuring approximately 6 mm is unchanged dating back to the December, 2020 examination. No new or suspicious findings elsewhere. Targeted ultrasound is performed, again demonstrating the anechoic mass with thin internal septations versus clustered cysts at the 9 o'clock position 4 cm from the nipple measuring approximately 5 x 2 x 5 mm (previously 6 x 3 x 4 mm on 03/16/2020, 6 x 4 x 5 mm on 09/10/2019 and 7 x 5 x 5 mm on 03/04/2019), demonstrating no posterior characteristics and no internal color Doppler flow. LEFT: No findings suspicious for malignancy. IMPRESSION: 1. No mammographic or  sonographic evidence of malignancy involving the RIGHT breast. 2. No mammographic evidence of malignancy involving the LEFT breast. 3. Stable complicated cyst or clustered cysts in the outer RIGHT breast dating back to January, 2021, confirming benignity. RECOMMENDATION: Screening mammogram in one year.(Code:SM-B-01Y) I have discussed the findings and recommendations with the patient. If applicable, a reminder letter will be sent to the patient regarding the next appointment. BI-RADS CATEGORY  2: Benign. Electronically Signed   By: Evangeline Dakin M.D.   On: 04/07/2021 11:32  US BREAST LTD UNI RIGHT INC AXILLA  Result Date: 04/07/2021 CLINICAL DATA:  Two year interval follow-up of a likely benign mass involving the outer RIGHT breast at the 9 o'clock position 4 cm from the nipple. Annual evaluation, LEFT breast. EXAM: DIGITAL DIAGNOSTIC BILATERAL MAMMOGRAM WITH TOMOSYNTHESIS AND CAD; ULTRASOUND RIGHT BREAST LIMITED TECHNIQUE: Bilateral digital diagnostic mammography and breast tomosynthesis was performed. The images were evaluated with computer-aided detection.; Targeted ultrasound examination of the right breast was  performed. COMPARISON:  Previous exam(s). ACR Breast Density Category b: There are scattered areas of fibroglandular density. FINDINGS: Full field CC and MLO views of both breasts were obtained. RIGHT: The isodense mass in the outer breast measuring approximately 6 mm is unchanged dating back to the December, 2020 examination. No new or suspicious findings elsewhere. Targeted ultrasound is performed, again demonstrating the anechoic mass with thin internal septations versus clustered cysts at the 9 o'clock position 4 cm from the nipple measuring approximately 5 x 2 x 5 mm (previously 6 x 3 x 4 mm on 03/16/2020, 6 x 4 x 5 mm on 09/10/2019 and 7 x 5 x 5 mm on 03/04/2019), demonstrating no posterior characteristics and no internal color Doppler flow. LEFT: No findings suspicious for malignancy.  IMPRESSION: 1. No mammographic or sonographic evidence of malignancy involving the RIGHT breast. 2. No mammographic evidence of malignancy involving the LEFT breast. 3. Stable complicated cyst or clustered cysts in the outer RIGHT breast dating back to January, 2021, confirming benignity. RECOMMENDATION: Screening mammogram in one year.(Code:SM-B-01Y) I have discussed the findings and recommendations with the patient. If applicable, a reminder letter will be sent to the patient regarding the next appointment. BI-RADS CATEGORY  2: Benign. Electronically Signed   By: Evangeline Dakin M.D.   On: 04/07/2021 11:32   Assessment & Plan:   Problem List Items Addressed This Visit   None   I spent a total of   minutes with this patient in a face to face visit on the date of this encounter reviewing the last office visit with me on        ,  most recent with patient's cardiologist in    ,  patient'ss diet and eating habits, home blood pressure readings ,  most recent imaging study ,   and post visit ordering of testing and therapeutics.    Follow-up: No follow-ups on file.   Crecencio Mc, MD

## 2021-08-03 NOTE — Assessment & Plan Note (Addendum)
Mild,  Hereditary  ,  With B12 deficiency addressed.  Gabapentin trial has failed again   Due to of thoracic back pain reucrrence an elevation of blood pressure.  SHE DEFERS ADDITIONAL THERAPY trials at this time

## 2021-08-03 NOTE — Patient Instructions (Signed)
You are doing well!   See you in 6 months   BEano for excessive gas /bloating due to legumes and vegetables

## 2021-08-03 NOTE — Assessment & Plan Note (Addendum)
She exercising regularly and  is following a plant based diet and adding a "ketogenic" supplement that I have reviewed today and is safe to take. (contains apple cider vinegar and vitamins)

## 2021-08-05 NOTE — Assessment & Plan Note (Signed)
She has lowered her A1c  With a low glycemic index diet and participation  in an aerobic  exercise activity.  We will continue surveillance with  A1c every  6 months.  Lab Results  Component Value Date   HGBA1C 5.6 04/21/2021

## 2021-08-05 NOTE — Assessment & Plan Note (Signed)
Diagnostic mammogram was negative for malignancy of the right breast.  Annual screening will  Be resumed in Feb 2024

## 2021-08-05 NOTE — Assessment & Plan Note (Signed)
Presumed by ultrasound changes and serologies negative for autoimmune causes of hepatitis.  Current liver enzymes are normal and all modifiable risk factors including obesity, diabetes and hyperlipidemia have been addressed   Lab Results  Component Value Date   ALT 13 04/21/2021   AST 16 04/21/2021   ALKPHOS 65 04/21/2021   BILITOT 0.7 04/21/2021

## 2021-08-05 NOTE — Assessment & Plan Note (Signed)
She has had diagnostic mammogram of right breast every 6 months x 2 with no change.  Continue routine screening FEb 2024

## 2021-08-22 ENCOUNTER — Ambulatory Visit (INDEPENDENT_AMBULATORY_CARE_PROVIDER_SITE_OTHER): Payer: Medicare Other

## 2021-08-22 DIAGNOSIS — E538 Deficiency of other specified B group vitamins: Secondary | ICD-10-CM | POA: Diagnosis not present

## 2021-08-22 DIAGNOSIS — R7303 Prediabetes: Secondary | ICD-10-CM | POA: Diagnosis not present

## 2021-08-22 DIAGNOSIS — M722 Plantar fascial fibromatosis: Secondary | ICD-10-CM | POA: Diagnosis not present

## 2021-08-22 MED ORDER — CYANOCOBALAMIN 1000 MCG/ML IJ SOLN
1000.0000 ug | Freq: Once | INTRAMUSCULAR | Status: AC
  Administered 2021-08-22: 1000 ug via INTRAMUSCULAR

## 2021-09-12 DIAGNOSIS — M7751 Other enthesopathy of right foot: Secondary | ICD-10-CM | POA: Diagnosis not present

## 2021-09-12 DIAGNOSIS — M722 Plantar fascial fibromatosis: Secondary | ICD-10-CM | POA: Diagnosis not present

## 2021-09-21 ENCOUNTER — Ambulatory Visit (INDEPENDENT_AMBULATORY_CARE_PROVIDER_SITE_OTHER): Payer: Medicare Other

## 2021-09-21 DIAGNOSIS — E538 Deficiency of other specified B group vitamins: Secondary | ICD-10-CM | POA: Diagnosis not present

## 2021-09-21 MED ORDER — CYANOCOBALAMIN 1000 MCG/ML IJ SOLN
1000.0000 ug | Freq: Once | INTRAMUSCULAR | Status: AC
  Administered 2021-09-21: 1000 ug via INTRAMUSCULAR

## 2021-09-21 NOTE — Progress Notes (Signed)
presents today for injection per MD orders. B12 injection administered IM in Right Upper Arm. Administration without incident. Patient tolerated well.  Everli Rother,cma  

## 2021-10-17 DIAGNOSIS — Z85828 Personal history of other malignant neoplasm of skin: Secondary | ICD-10-CM | POA: Diagnosis not present

## 2021-10-17 DIAGNOSIS — L578 Other skin changes due to chronic exposure to nonionizing radiation: Secondary | ICD-10-CM | POA: Diagnosis not present

## 2021-10-17 DIAGNOSIS — L218 Other seborrheic dermatitis: Secondary | ICD-10-CM | POA: Diagnosis not present

## 2021-10-17 DIAGNOSIS — Z872 Personal history of diseases of the skin and subcutaneous tissue: Secondary | ICD-10-CM | POA: Diagnosis not present

## 2021-10-17 DIAGNOSIS — L821 Other seborrheic keratosis: Secondary | ICD-10-CM | POA: Diagnosis not present

## 2021-10-18 ENCOUNTER — Other Ambulatory Visit (INDEPENDENT_AMBULATORY_CARE_PROVIDER_SITE_OTHER): Payer: Medicare Other

## 2021-10-18 DIAGNOSIS — E538 Deficiency of other specified B group vitamins: Secondary | ICD-10-CM

## 2021-10-18 DIAGNOSIS — N1831 Chronic kidney disease, stage 3a: Secondary | ICD-10-CM

## 2021-10-18 DIAGNOSIS — E782 Mixed hyperlipidemia: Secondary | ICD-10-CM | POA: Diagnosis not present

## 2021-10-18 LAB — COMPREHENSIVE METABOLIC PANEL
ALT: 11 U/L (ref 0–35)
AST: 12 U/L (ref 0–37)
Albumin: 4.1 g/dL (ref 3.5–5.2)
Alkaline Phosphatase: 69 U/L (ref 39–117)
BUN: 28 mg/dL — ABNORMAL HIGH (ref 6–23)
CO2: 21 mEq/L (ref 19–32)
Calcium: 9.3 mg/dL (ref 8.4–10.5)
Chloride: 105 mEq/L (ref 96–112)
Creatinine, Ser: 1.32 mg/dL — ABNORMAL HIGH (ref 0.40–1.20)
GFR: 40.42 mL/min — ABNORMAL LOW (ref >60.00)
Glucose, Bld: 84 mg/dL (ref 70–99)
Potassium: 4.6 mEq/L (ref 3.5–5.1)
Sodium: 142 mEq/L (ref 135–145)
Total Bilirubin: 0.6 mg/dL (ref 0.2–1.2)
Total Protein: 6.3 g/dL (ref 6.0–8.3)

## 2021-10-18 LAB — VITAMIN D 25 HYDROXY (VIT D DEFICIENCY, FRACTURES): VITD: 32.35 ng/mL (ref 30.00–100.00)

## 2021-10-18 LAB — LIPID PANEL
Cholesterol: 159 mg/dL (ref 0–200)
HDL: 57.8 mg/dL (ref >39.00)
LDL Cholesterol: 71 mg/dL (ref 0–99)
NonHDL: 101.34
Total CHOL/HDL Ratio: 3
Triglycerides: 153 mg/dL — ABNORMAL HIGH (ref 0.0–149.0)
VLDL: 30.6 mg/dL (ref 0.0–40.0)

## 2021-10-18 LAB — VITAMIN B12: Vitamin B-12: 552 pg/mL (ref 211–911)

## 2021-10-23 ENCOUNTER — Ambulatory Visit (INDEPENDENT_AMBULATORY_CARE_PROVIDER_SITE_OTHER): Payer: Medicare Other | Admitting: Internal Medicine

## 2021-10-23 ENCOUNTER — Encounter: Payer: Self-pay | Admitting: Internal Medicine

## 2021-10-23 VITALS — BP 126/74 | HR 65 | Temp 98.2°F | Ht 66.0 in | Wt 199.1 lb

## 2021-10-23 DIAGNOSIS — Z0000 Encounter for general adult medical examination without abnormal findings: Secondary | ICD-10-CM | POA: Diagnosis not present

## 2021-10-23 DIAGNOSIS — Z1231 Encounter for screening mammogram for malignant neoplasm of breast: Secondary | ICD-10-CM | POA: Diagnosis not present

## 2021-10-23 DIAGNOSIS — Z23 Encounter for immunization: Secondary | ICD-10-CM | POA: Diagnosis not present

## 2021-10-23 DIAGNOSIS — D692 Other nonthrombocytopenic purpura: Secondary | ICD-10-CM

## 2021-10-23 DIAGNOSIS — N1831 Chronic kidney disease, stage 3a: Secondary | ICD-10-CM

## 2021-10-23 DIAGNOSIS — I700 Atherosclerosis of aorta: Secondary | ICD-10-CM

## 2021-10-23 DIAGNOSIS — K760 Fatty (change of) liver, not elsewhere classified: Secondary | ICD-10-CM | POA: Diagnosis not present

## 2021-10-23 DIAGNOSIS — R920 Mammographic microcalcification found on diagnostic imaging of breast: Secondary | ICD-10-CM

## 2021-10-23 DIAGNOSIS — N952 Postmenopausal atrophic vaginitis: Secondary | ICD-10-CM

## 2021-10-23 NOTE — Progress Notes (Signed)
The patient is here for annual preventive examination and management of other chronic and acute problems.   The risk factors are reflected in the social history.   The roster of all physicians providing medical care to patient - is listed in the Snapshot section of the chart.   Activities of daily living:  The patient is 100% independent in all ADLs: dressing, toileting, feeding as well as independent mobility   Home safety : The patient has smoke detectors in the home. They wear seatbelts.  There are no unsecured firearms at home. There is no violence in the home.    There is no risks for hepatitis, STDs or HIV. There is no   history of blood transfusion. They have no travel history to infectious disease endemic areas of the world.   The patient has seen their dentist in the last six month. They have seen their eye doctor in the last year. The patinet  denies slight hearing difficulty with regard to whispered voices and some television programs.  They have deferred audiologic testing in the last year.  They do not  have excessive sun exposure. Discussed the need for sun protection: hats, long sleeves and use of sunscreen if there is significant sun exposure.    Diet: the importance of a healthy diet is discussed. They do have a healthy diet.   The benefits of regular aerobic exercise were discussed. The patient  exercises  3  days per week  for  60 minutes using a water aerobics class,  and walks daily     Depression screen: there are no signs or vegative symptoms of depression- irritability, change in appetite, anhedonia, sadness/tearfullness.   The following portions of the patient's history were reviewed and updated as appropriate: allergies, current medications, past family history, past medical history,  past surgical history, past social history  and problem list.   Visual acuity was not assessed per patient preference since the patient has regular follow up with an  ophthalmologist.  Hearing and body mass index were assessed and reviewed.    During the course of the visit the patient was educated and counseled about appropriate screening and preventive services including : fall prevention , diabetes screening, nutrition counseling, colorectal cancer screening, and recommended immunizations.    Chief Complaint:  None:  she is actively trying to lose weight and has succeeded using portion control and exercise.  She has set a goal of 165 lbs for herself.      Review of Symptoms  Patient denies headache, fevers, malaise, unintentional weight loss, skin rash, eye pain, sinus congestion and sinus pain, sore throat, dysphagia,  hemoptysis , cough, dyspnea, wheezing, chest pain, palpitations, orthopnea, edema, abdominal pain, nausea, melena, diarrhea, constipation, flank pain, dysuria, hematuria, urinary  Frequency, nocturia, numbness, tingling, seizures,  Focal weakness, Loss of consciousness,  Tremor, insomnia, depression, anxiety, and suicidal ideation.    Physical Exam:  BP 126/74 (BP Location: Left Arm, Patient Position: Sitting, Cuff Size: Large)   Pulse 65   Temp 98.2 F (36.8 C) (Oral)   Ht '5\' 6"'$  (1.676 m)   Wt 199 lb 1.9 oz (90.3 kg)   SpO2 95%   BMI 32.14 kg/m    General appearance: alert, cooperative and appears stated age Head: Normocephalic, without obvious abnormality, atraumatic Eyes: conjunctivae/corneas clear. PERRL, EOM's intact. Fundi benign. Ears: normal TM's and external ear canals both ears Nose: Nares normal. Septum midline. Mucosa normal. No drainage or sinus tenderness. Throat: lips, mucosa, and tongue  normal; teeth and gums normal Neck: no adenopathy, no carotid bruit, no JVD, supple, symmetrical, trachea midline and thyroid not enlarged, symmetric, no tenderness/mass/nodules Lungs: clear to auscultation bilaterally Breasts: normal appearance, no masses or tenderness Heart: regular rate and rhythm, S1, S2 normal, no murmur, click, rub or  gallop Abdomen: soft, non-tender; bowel sounds normal; no masses,  no organomegaly Extremities: extremities normal, atraumatic, no cyanosis or edema Pulses: 2+ and symmetric Skin: Skin color, texture, turgor normal. No rashes or lesions Neurologic: Alert and oriented X 3, normal strength and tone. Normal symmetric reflexes. Normal coordination and gait.     Assessment and Plan:  Obesity I have congratulated her in reduction of   BMI and encouraged  Continued weight loss with goal of 10% of body weigh over the next 6 months using a low glycemic index diet and regular exercise a minimum of 5 days per week.    Abnormal mammogram with microcalcification Resume annual screening in February 2024  Atherosclerosis of aorta [facility]) Continue statin.  LDL is 71   Atrophic vaginitis Managed with vaginal estrogen    CKD (chronic kidney disease) stage 3, GFR 30-59 ml/min (HCC) Continue semi annual follow up with nephrology .  Continue telmisartan   Encounter for preventive health examination age appropriate education and counseling updated, referrals for preventative services and immunizations addressed, dietary and smoking counseling addressed, most recent labs reviewed.  I have personally reviewed and have noted:   1) the patient's medical and social history 2) The pt's use of alcohol, tobacco, and illicit drugs 3) The patient's current medications and supplements 4) Functional ability including ADL's, fall risk, home safety risk, hearing and visual impairment 5) Diet and physical activities 6) Evidence for depression or mood disorder 7) The patient's height, weight, and BMI have been recorded in the chart     I have made referrals, and provided counseling and education based on review of the above  Hepatic steatosis Presumed by ultrasound changes and serologies negative for autoimmune causes of hepatitis.  Current liver enzymes are normal and all modifiable risk factors including obesity  and hyperlipidemia have been addressed   Lab Results  Component Value Date   ALT 11 10/18/2021   AST 12 10/18/2021   ALKPHOS 69 10/18/2021   BILITOT 0.6 10/18/2021     Purpura senilis ([place]) She is taking a baby aspirin for primary prevention given evidence of aortic atherosclerosis.    Updated Medication List Outpatient Encounter Medications as of 10/23/2021  Medication Sig   amLODipine (NORVASC) 2.5 MG tablet TAKE ONE TABLET BY MOUTH DAILY   aspirin 81 MG tablet Take 81 mg by mouth daily.   cetirizine (ZYRTEC) 10 MG tablet Take 10 mg by mouth at bedtime.   Cholecalciferol (VITAMIN D3) 1000 UNITS CAPS Take 1,000 Units by mouth daily.   estradiol (ESTRACE VAGINAL) 0.1 MG/GM vaginal cream Place 0.5 g vaginally 3 (three) times a week.   famotidine (PEPCID) 10 MG tablet Take 10 mg by mouth daily. As needed   Fluocinolone Acetonide 0.01 % OIL Apply 1 application topically See admin instructions. Apply to affected ear daily as needed for dermatisis   fluticasone (FLONASE) 50 MCG/ACT nasal spray PLACE 2 SPRAYS INTO THE NOSE DAILY.   NON FORMULARY Apply 1 application topically daily as needed (for dermatisis on face). Hydrocortisone 2.5%/Ketoconazole 2% Cream   nystatin (MYCOSTATIN/NYSTOP) powder Apply topically 2 (two) times daily. To rash until resolved.   Psyllium (METAMUCIL FIBER PO) Take by mouth. Take 1 tablet daily.  simvastatin (ZOCOR) 40 MG tablet TAKE ONE TABLET BY MOUTH EVERY NIGHT AT BEDTIME   telmisartan (MICARDIS) 80 MG tablet TAKE ONE TABLET BY MOUTH EVERY EVENING   No facility-administered encounter medications on file as of 10/23/2021.

## 2021-10-23 NOTE — Assessment & Plan Note (Signed)
Continue statin.  LDL is 71

## 2021-10-23 NOTE — Assessment & Plan Note (Signed)
Presumed by ultrasound changes and serologies negative for autoimmune causes of hepatitis.  Current liver enzymes are normal and all modifiable risk factors including obesity and hyperlipidemia have been addressed   Lab Results  Component Value Date   ALT 11 10/18/2021   AST 12 10/18/2021   ALKPHOS 69 10/18/2021   BILITOT 0.6 10/18/2021

## 2021-10-23 NOTE — Assessment & Plan Note (Signed)
Resume annual screening in February 2024

## 2021-10-23 NOTE — Assessment & Plan Note (Signed)
Continue semi annual follow up with nephrology .  Continue telmisartan

## 2021-10-23 NOTE — Assessment & Plan Note (Signed)
Managed with vaginal estrogen.  

## 2021-10-23 NOTE — Assessment & Plan Note (Signed)

## 2021-10-23 NOTE — Assessment & Plan Note (Signed)
I have congratulated her in reduction of   BMI and encouraged  Continued weight loss with goal of 10% of body weigh over the next 6 months using a low glycemic index diet and regular exercise a minimum of 5 days per week.    

## 2021-10-23 NOTE — Assessment & Plan Note (Signed)
She is taking a baby aspirin for primary prevention given evidence of aortic atherosclerosis.

## 2021-10-24 ENCOUNTER — Ambulatory Visit (INDEPENDENT_AMBULATORY_CARE_PROVIDER_SITE_OTHER): Payer: Medicare Other

## 2021-10-24 DIAGNOSIS — E538 Deficiency of other specified B group vitamins: Secondary | ICD-10-CM

## 2021-10-24 MED ORDER — CYANOCOBALAMIN 1000 MCG/ML IJ SOLN
1000.0000 ug | Freq: Once | INTRAMUSCULAR | Status: AC
  Administered 2021-10-24: 1000 ug via INTRAMUSCULAR

## 2021-10-24 NOTE — Progress Notes (Signed)
Pt presented today for b12 injection. Right deltoid, IM. Pt voiced no concerns nor showed any signs of distress during injection.  

## 2021-10-26 DIAGNOSIS — N1831 Chronic kidney disease, stage 3a: Secondary | ICD-10-CM | POA: Diagnosis not present

## 2021-10-26 DIAGNOSIS — I10 Essential (primary) hypertension: Secondary | ICD-10-CM | POA: Diagnosis not present

## 2021-11-02 DIAGNOSIS — N1832 Chronic kidney disease, stage 3b: Secondary | ICD-10-CM | POA: Diagnosis not present

## 2021-11-02 DIAGNOSIS — I129 Hypertensive chronic kidney disease with stage 1 through stage 4 chronic kidney disease, or unspecified chronic kidney disease: Secondary | ICD-10-CM | POA: Diagnosis not present

## 2021-11-03 ENCOUNTER — Ambulatory Visit (INDEPENDENT_AMBULATORY_CARE_PROVIDER_SITE_OTHER): Payer: Medicare Other | Admitting: Family

## 2021-11-03 ENCOUNTER — Encounter: Payer: Self-pay | Admitting: Family

## 2021-11-03 VITALS — BP 118/76 | HR 65 | Temp 99.4°F | Ht 66.0 in | Wt 199.2 lb

## 2021-11-03 DIAGNOSIS — J320 Chronic maxillary sinusitis: Secondary | ICD-10-CM

## 2021-11-03 DIAGNOSIS — J019 Acute sinusitis, unspecified: Secondary | ICD-10-CM | POA: Insufficient documentation

## 2021-11-03 DIAGNOSIS — J329 Chronic sinusitis, unspecified: Secondary | ICD-10-CM | POA: Insufficient documentation

## 2021-11-03 MED ORDER — AMOXICILLIN-POT CLAVULANATE 875-125 MG PO TABS: 1.0000 | ORAL_TABLET | Freq: Two times a day (BID) | ORAL | 0 refills | Status: DC

## 2021-11-03 NOTE — Assessment & Plan Note (Addendum)
Afebrile and well-appearing today.  Reassuring neurologic exam.  Discussed with patient question if vertigo is related to sinusitis or separate etiology. ( ? BPPV) .  We jointly agreed based on duration of symptoms to start Augmentin.  Patient will stay vigilant as it relates to vertigo and certainly this would become persistent or new symptoms were to accompany vertigo, she is aware she should go to the emergency room.  May consider meclizine, ENT referral.

## 2021-11-03 NOTE — Progress Notes (Signed)
Subjective:    Patient ID: Jillian [name], female    DOB: 10/31/1950, 71 y.o.   MRN: [id number]  CC: Jillian [name] is a 71 y.o. female who presents today for an acute visit.    HPI: Complains of sinus congestion x 5 days, unchanged  Endorses facial pressure, PND  Compliant with zyrtec, flonase, saline.   No recent antibiotic.   H/o PNA.    She also complains vertigo with the 'room moving', episodic for couple of weeks.  Starts when turning in bed, the bed would spin.  She practices maneuvers in which she lays on one side of head and then the other to shift crystals with resolution of symptom.   No sore throat,  hearing loss, ear pain, tinnitus, HA, vision changes, confusion  No falls  No h/o lung disease   H/o Hypertension,, hepatic stenosis, GERD, chronic kidney disease HISTORY:  Past Medical History:  Diagnosis Date   Allergy    seasonal rhinitis   Arthritis    right shoulder,  no intervention   Cancer (HCC)    basal cell    Chronic right maxillary sinusitis 08/13/2018   GERD (gastroesophageal reflux disease)    Hyperlipidemia    Hypertension    Irritable bowel syndrome (IBS) Oct 2011   last colonoscopy showed diverticulum, no polyps   Neuropathy    FEET   Thrombophlebitis of left lower extremity ([place]) 09/29/2019   Vertigo    OCCAS   Past Surgical History:  Procedure Laterality Date   BREAST BIOPSY Right 03/12/2019   [place] bx neg   CATARACT EXTRACTION W/PHACO Left 04/09/2017   Procedure: CATARACT EXTRACTION PHACO AND INTRAOCULAR LENS PLACEMENT ([place]);  Surgeon: Birder Robson, MD;  Location: [facility] ORS;  Service: Ophthalmology;  Laterality: Left;  [place] 00:29.6 AP% 18.4 CDE 5.44 FLUID PACK LOT # [id number]   CATARACT EXTRACTION W/PHACO Right 04/30/2017   Procedure: CATARACT EXTRACTION PHACO AND INTRAOCULAR LENS PLACEMENT ([facility]);  Surgeon: Birder Robson, MD;  Location: [facility] ORS;  Service: Ophthalmology;  Laterality: Right;  [place] 00:43.1 AP% 13.7 CDE  5.91 Fluid Pack Lot # [id number]   right breast bx  03/12/2019   TONSILLECTOMY AND ADENOIDECTOMY  1956   TUBAL LIGATION  1977   VEIN SURGERY     Family History  Problem Relation Age of Onset   Hyperlipidemia Mother    Hyperlipidemia Father    Hypertension Father    Hyperlipidemia Maternal Grandmother    Hyperlipidemia Maternal Grandfather    Cancer Cousin    Breast cancer Sister 8    Allergies: Doxycycline, Gabapentin, Sulfa drugs cross reactors, and Tetanus toxoids Current Outpatient Medications on File Prior to Visit  Medication Sig Dispense Refill   amLODipine (NORVASC) 2.5 MG tablet TAKE ONE TABLET BY MOUTH DAILY 90 tablet 3   aspirin 81 MG tablet Take 81 mg by mouth daily.     cetirizine (ZYRTEC) 10 MG tablet Take 10 mg by mouth at bedtime.     Cholecalciferol (VITAMIN D3) 1000 UNITS CAPS Take 1,000 Units by mouth daily.     estradiol (ESTRACE VAGINAL) 0.1 MG/GM vaginal cream Place 0.5 g vaginally 3 (three) times a week. 42.5 g 2   famotidine (PEPCID) 10 MG tablet Take 10 mg by mouth daily. As needed     Fluocinolone Acetonide 0.01 % OIL Apply 1 application topically See admin instructions. Apply to affected ear daily as needed for dermatisis     fluticasone (FLONASE) 50 MCG/ACT nasal spray PLACE 2 SPRAYS INTO  THE NOSE DAILY. 16 g 4   NON FORMULARY Apply 1 application topically daily as needed (for dermatisis on face). Hydrocortisone 2.5%/Ketoconazole 2% Cream     nystatin (MYCOSTATIN/NYSTOP) powder Apply topically 2 (two) times daily. To rash until resolved. 15 g 0   Psyllium (METAMUCIL FIBER PO) Take by mouth. Take 1 tablet daily.     simvastatin (ZOCOR) 40 MG tablet TAKE ONE TABLET BY MOUTH EVERY NIGHT AT BEDTIME 90 tablet 3   telmisartan (MICARDIS) 80 MG tablet TAKE ONE TABLET BY MOUTH EVERY EVENING 90 tablet 1   No current facility-administered medications on file prior to visit.    Social History   Tobacco Use   Smoking status: Former    Types: Cigarettes    Quit  date: 03/16/1999    Years since quitting: 22.6   Smokeless tobacco: Never  Vaping Use   Vaping Use: Never used  Substance Use Topics   Alcohol use: No    Alcohol/week: 0.0 standard drinks of alcohol   Drug use: No    Review of Systems  Constitutional:  Negative for chills and fever.  HENT:  Positive for congestion and postnasal drip.   Eyes:  Negative for visual disturbance.  Respiratory:  Negative for cough.   Cardiovascular:  Negative for chest pain and palpitations.  Gastrointestinal:  Negative for nausea and vomiting.  Neurological:  Negative for dizziness, numbness and headaches.      Objective:    BP 118/76 (BP Location: Left Arm, Patient Position: Sitting, Cuff Size: Normal)   Pulse 65   Temp 99.4 F (37.4 C) (Oral)   Ht '5\' 6"'$  (1.676 m)   Wt 199 lb 3.2 oz (90.4 kg)   SpO2 95%   BMI 32.15 kg/m    Physical Exam Vitals reviewed.  Constitutional:      Appearance: She is well-developed.  HENT:     Head: Normocephalic and atraumatic.     Right Ear: Hearing, tympanic membrane, ear canal and external ear normal. No swelling or tenderness. No middle ear effusion. Tympanic membrane is not erythematous or bulging.     Left Ear: Hearing, tympanic membrane, ear canal and external ear normal. No swelling or tenderness.  No middle ear effusion. Tympanic membrane is not erythematous or bulging.     Nose: No rhinorrhea.     Right Sinus: No maxillary sinus tenderness or frontal sinus tenderness.     Left Sinus: No maxillary sinus tenderness or frontal sinus tenderness.     Mouth/Throat:     Pharynx: Uvula midline. No posterior oropharyngeal erythema or uvula swelling.  Eyes:     General: Lids are normal. Lids are everted, no foreign bodies appreciated.     Conjunctiva/sclera: Conjunctivae normal.     Pupils: Pupils are equal, round, and reactive to light.  Cardiovascular:     Rate and Rhythm: Normal rate and regular rhythm.     Pulses: Normal pulses.     Heart sounds: Normal  heart sounds.  Pulmonary:     Effort: Pulmonary effort is normal.     Breath sounds: Normal breath sounds. No wheezing, rhonchi or rales.  Lymphadenopathy:     Head:     Right side of head: No submental, submandibular, tonsillar, preauricular, posterior auricular or occipital adenopathy.     Left side of head: No submental, submandibular, tonsillar, preauricular, posterior auricular or occipital adenopathy.     Cervical: No cervical adenopathy.     Right cervical: No superficial, deep or posterior cervical adenopathy.  Left cervical: No superficial, deep or posterior cervical adenopathy.  Skin:    General: Skin is warm and dry.  Neurological:     Mental Status: She is alert.     Cranial Nerves: No cranial nerve deficit.     Sensory: No sensory deficit.     Deep Tendon Reflexes:     Reflex Scores:      Bicep reflexes are 2+ on the right side and 2+ on the left side.      Patellar reflexes are 2+ on the right side and 2+ on the left side.    Comments: Grip equal and strong bilateral upper extremities. Gait strong and steady. Able to perform finger-to-nose without difficulty.    Psychiatric:        Speech: Speech normal.        Behavior: Behavior normal.        Thought Content: Thought content normal.        Assessment & Plan:    Problem List Items Addressed This Visit       Respiratory   Sinusitis - Primary    Afebrile and well-appearing today.  Reassuring neurologic exam.  Discussed with patient question if vertigo is related to sinusitis or separate etiology. ( ? BPPV) .  We jointly agreed based on duration of symptoms to start Augmentin.  Patient will stay vigilant as it relates to vertigo and certainly this would become persistent or new symptoms were to accompany vertigo, she is aware she should go to the emergency room.  May consider meclizine, ENT referral.       Relevant Medications   amoxicillin-clavulanate (AUGMENTIN) 875-125 MG tablet     I am having  Jillian [name] start on amoxicillin-clavulanate. I am also having her maintain her Vitamin D3, aspirin, fluticasone, famotidine, cetirizine, Fluocinolone Acetonide, NON FORMULARY, nystatin, Psyllium (METAMUCIL FIBER PO), estradiol, amLODipine, simvastatin, and telmisartan.   Meds ordered this encounter  Medications   amoxicillin-clavulanate (AUGMENTIN) 875-125 MG tablet    Sig: Take 1 tablet by mouth 2 (two) times daily for 7 days.    Dispense:  14 tablet    Refill:  0    Order Specific Question:   Supervising Provider    Answer:   Crecencio Mc [[id number]]    Return precautions given.   Risks, benefits, and alternatives of the medications and treatment plan prescribed today were discussed, and patient expressed understanding.   Education regarding symptom management and diagnosis given to patient on AVS.  Continue to follow with Crecencio Mc, MD for routine health maintenance.   Jillian [name] and I agreed with plan.   Mable Paris, FNP

## 2021-11-03 NOTE — Patient Instructions (Addendum)
Start augmentin as discussed  Ensure to take probiotics while on antibiotics and also for 2 weeks after completion. This can either be by eating yogurt daily or taking a probiotic supplement over the counter such as Culturelle.It is important to re-colonize the gut with good bacteria and also to prevent any diarrheal infections associated with antibiotic use.    Please pay very close attention as discussed to the vertigo that you are experiencing.  Most certainly if the symptom were to become persistent ,worsen or be associated with vision changes headache confusion, please not hesitate to go to emergency room.  We may consider meclizine or an ear nose and throat referral if persist

## 2021-11-04 ENCOUNTER — Encounter: Payer: Self-pay | Admitting: Family

## 2021-11-06 ENCOUNTER — Other Ambulatory Visit: Payer: Self-pay | Admitting: Family

## 2021-11-06 DIAGNOSIS — J320 Chronic maxillary sinusitis: Secondary | ICD-10-CM

## 2021-11-06 MED ORDER — CEFDINIR 300 MG PO CAPS: 300.0000 mg | ORAL_CAPSULE | Freq: Two times a day (BID) | ORAL | 0 refills | Status: AC

## 2021-11-20 ENCOUNTER — Encounter: Payer: Self-pay | Admitting: Family

## 2021-11-21 ENCOUNTER — Other Ambulatory Visit: Payer: Self-pay | Admitting: Family

## 2021-11-21 DIAGNOSIS — J320 Chronic maxillary sinusitis: Secondary | ICD-10-CM

## 2021-11-24 ENCOUNTER — Ambulatory Visit (INDEPENDENT_AMBULATORY_CARE_PROVIDER_SITE_OTHER): Payer: Medicare Other

## 2021-11-24 DIAGNOSIS — E538 Deficiency of other specified B group vitamins: Secondary | ICD-10-CM | POA: Diagnosis not present

## 2021-11-24 MED ORDER — CYANOCOBALAMIN 1000 MCG/ML IJ SOLN
1000.0000 ug | Freq: Once | INTRAMUSCULAR | Status: AC
  Administered 2021-11-24: 1000 ug via INTRAMUSCULAR

## 2021-11-24 NOTE — Progress Notes (Signed)
Pt presented for her B12 injection. Pt was identified through two identifiers. Pt tolerated shot well in the left deltoid.

## 2021-11-27 DIAGNOSIS — H6121 Impacted cerumen, right ear: Secondary | ICD-10-CM | POA: Diagnosis not present

## 2021-11-27 DIAGNOSIS — R42 Dizziness and giddiness: Secondary | ICD-10-CM | POA: Diagnosis not present

## 2021-12-19 ENCOUNTER — Encounter: Payer: Self-pay | Admitting: Obstetrics and Gynecology

## 2021-12-19 DIAGNOSIS — H903 Sensorineural hearing loss, bilateral: Secondary | ICD-10-CM | POA: Diagnosis not present

## 2021-12-19 DIAGNOSIS — R42 Dizziness and giddiness: Secondary | ICD-10-CM | POA: Diagnosis not present

## 2021-12-21 ENCOUNTER — Encounter: Payer: Self-pay | Admitting: Obstetrics and Gynecology

## 2021-12-21 ENCOUNTER — Ambulatory Visit: Payer: Medicare Other | Admitting: Obstetrics and Gynecology

## 2021-12-21 VITALS — BP 119/77 | HR 62 | Ht 66.0 in | Wt 203.0 lb

## 2021-12-21 DIAGNOSIS — N952 Postmenopausal atrophic vaginitis: Secondary | ICD-10-CM

## 2021-12-21 NOTE — Progress Notes (Signed)
GYNECOLOGY PROGRESS NOTE  Subjective:    Patient ID: Jillian [name], female    DOB: 1950/10/07, 71 y.o.   MRN: [id number]  HPI Patient is a 71 y.o. G24P2002 female who presents for 1 year f/u of atrophic vaginitis.  She denies complaints. Notes continued maintained improvement of symptoms on the Premarin cream Is still doing water aerobic which cause some irritation due to the choline in water but states her cream helps.  The following portions of the patient's history were reviewed and updated as appropriate:  She  has a past medical history of Allergy, Arthritis, Cancer ([place]), Chronic right maxillary sinusitis (08/13/2018), GERD (gastroesophageal reflux disease), Hyperlipidemia, Hypertension, Irritable bowel syndrome (IBS) (Oct 2011), Neuropathy, Thrombophlebitis of left lower extremity ([place]) (09/29/2019), and Vertigo.  She  has a past surgical history that includes Tubal ligation (1977); Tonsillectomy and adenoidectomy (1956); Vein Surgery; Cataract extraction w/PHACO (Left, 04/09/2017); Cataract extraction w/PHACO (Right, 04/30/2017); right breast bx (03/12/2019); and Breast biopsy (Right, 03/12/2019).  Her family history includes Breast cancer (age of onset: 33) in her sister; Cancer in her cousin; Hyperlipidemia in her father, maternal grandfather, maternal grandmother, and mother; Hypertension in her father.  She  reports that she quit smoking about 22 years ago. Her smoking use included cigarettes. She has never used smokeless tobacco. She reports that she does not drink alcohol and does not use drugs.  Current Outpatient Medications on File Prior to Visit  Medication Sig Dispense Refill   amLODipine (NORVASC) 2.5 MG tablet TAKE ONE TABLET BY MOUTH DAILY 90 tablet 3   aspirin 81 MG tablet Take 81 mg by mouth daily.     cetirizine (ZYRTEC) 10 MG tablet Take 10 mg by mouth at bedtime.     Cholecalciferol (VITAMIN D3) 1000 UNITS CAPS Take 1,000 Units by mouth daily.     estradiol  (ESTRACE VAGINAL) 0.1 MG/GM vaginal cream Place 0.5 g vaginally 3 (three) times a week. 42.5 g 2   famotidine (PEPCID) 10 MG tablet Take 10 mg by mouth daily. As needed     Fluocinolone Acetonide 0.01 % OIL Apply 1 application topically See admin instructions. Apply to affected ear daily as needed for dermatisis     fluticasone (FLONASE) 50 MCG/ACT nasal spray PLACE 2 SPRAYS INTO THE NOSE DAILY. 16 g 4   NON FORMULARY Apply 1 application topically daily as needed (for dermatisis on face). Hydrocortisone 2.5%/Ketoconazole 2% Cream     nystatin (MYCOSTATIN/NYSTOP) powder Apply topically 2 (two) times daily. To rash until resolved. 15 g 0   Psyllium (METAMUCIL FIBER PO) Take by mouth. Take 1 tablet daily.     simvastatin (ZOCOR) 40 MG tablet TAKE ONE TABLET BY MOUTH EVERY NIGHT AT BEDTIME 90 tablet 3   telmisartan (MICARDIS) 80 MG tablet TAKE ONE TABLET BY MOUTH EVERY EVENING 90 tablet 1   No current facility-administered medications on file prior to visit.   She is allergic to doxycycline, gabapentin, sulfa drugs cross reactors, and tetanus toxoids..  Review of Systems Pertinent items are noted in HPI.   Objective:   Blood pressure 119/77, pulse 62, height '5\' 6"'$  (1.676 m), weight 203 lb (92.1 kg).  Body mass index is 32.77 kg/m. General appearance: alert, cooperative, and no distress Abdomen: soft, non-tender; bowel sounds normal; no masses,  no organomegaly Pelvic: external genitalia normal, rectovaginal septum normal.  Vagina without discharge, mild atrophy present.  Extremities: extremities normal, atraumatic, no cyanosis or edema Neurologic: Grossly normal   Assessment:   1.  Vaginal atrophy      Plan:   - Patient to continue use of Premarin cream as needed for symptomatic atrophic vaginitis.  - RTC in 1 year.     Rubie Maid, [facility]

## 2021-12-25 ENCOUNTER — Encounter (INDEPENDENT_AMBULATORY_CARE_PROVIDER_SITE_OTHER): Payer: Self-pay

## 2021-12-25 ENCOUNTER — Ambulatory Visit: Payer: Medicare Other

## 2021-12-26 ENCOUNTER — Ambulatory Visit (INDEPENDENT_AMBULATORY_CARE_PROVIDER_SITE_OTHER): Payer: Medicare Other

## 2021-12-26 DIAGNOSIS — E538 Deficiency of other specified B group vitamins: Secondary | ICD-10-CM

## 2021-12-26 MED ORDER — CYANOCOBALAMIN 1000 MCG/ML IJ SOLN
1000.0000 ug | Freq: Once | INTRAMUSCULAR | Status: AC
  Administered 2021-12-26: 1000 ug via INTRAMUSCULAR

## 2021-12-26 NOTE — Progress Notes (Signed)
Pt arrived for B12 injection, given in R deltoid. Pt tolerated injection well, showed no signs of distress nor voiced any concerns.  

## 2022-01-10 DIAGNOSIS — R202 Paresthesia of skin: Secondary | ICD-10-CM | POA: Diagnosis not present

## 2022-01-10 DIAGNOSIS — R200 Anesthesia of skin: Secondary | ICD-10-CM | POA: Diagnosis not present

## 2022-01-10 DIAGNOSIS — R42 Dizziness and giddiness: Secondary | ICD-10-CM | POA: Diagnosis not present

## 2022-01-22 ENCOUNTER — Telehealth: Payer: Self-pay | Admitting: Family

## 2022-01-22 ENCOUNTER — Encounter: Payer: Self-pay | Admitting: Family

## 2022-01-22 ENCOUNTER — Ambulatory Visit (INDEPENDENT_AMBULATORY_CARE_PROVIDER_SITE_OTHER): Payer: Medicare Other | Admitting: Family

## 2022-01-22 VITALS — BP 120/86 | HR 65 | Temp 97.9°F | Wt 203.4 lb

## 2022-01-22 DIAGNOSIS — J301 Allergic rhinitis due to pollen: Secondary | ICD-10-CM | POA: Diagnosis not present

## 2022-01-22 DIAGNOSIS — J320 Chronic maxillary sinusitis: Secondary | ICD-10-CM | POA: Diagnosis not present

## 2022-01-22 NOTE — Progress Notes (Signed)
Subjective:    Patient ID: Jillian [name], female    DOB: [birth year], 71 y.o.   MRN: [id number]  CC: Jillian [name] is a 71 y.o. female who presents today for an acute visit.    HPI: Complains of chronic nasal congestion , PND.  Clear congestion.  Tested for allergies per patient at ENT which revealed allergy to tree. No pets in home Worse after dusting.  No cough, sob, cp.   Vertigo is intermittent. She thinks meclizine and exercises may be helping.    Congestion after 11/13/2021 resolved . Congestion feels different and chronic.  Started on Augmentin at that time. She remains compliant with Zyrtec 10 mg, Flonase without relief.  She was started on azelastine she thinks by Dr. Richardson Landry. No history depression, anxiety.  No history with suicidal ideation, suicide attempt or hospitalization for depression or anxiety  Ent consult and referred her to neurology  ENT consult 05/15/2021, Dr. Richardson Landry.  Negative BPPV.  Plan VNG, audiogram  to evaluate further.  initial consult neurology 01/10/2022 for dizziness.  Prescribed meclizine for vertigo HISTORY:  Past Medical History:  Diagnosis Date   Allergy    seasonal rhinitis   Arthritis    right shoulder,  no intervention   Cancer ([place])    basal cell    Chronic right maxillary sinusitis 08/13/2018   GERD (gastroesophageal reflux disease)    Hyperlipidemia    Hypertension    Irritable bowel syndrome (IBS) Oct 2011   last colonoscopy showed diverticulum, no polyps   Neuropathy    FEET   Thrombophlebitis of left lower extremity ([place]) 09/29/2019   Vertigo    OCCAS   Past Surgical History:  Procedure Laterality Date   BREAST BIOPSY Right 03/12/2019   [place] bx neg   CATARACT EXTRACTION W/PHACO Left 04/09/2017   Procedure: CATARACT EXTRACTION PHACO AND INTRAOCULAR LENS PLACEMENT ([place]);  Surgeon: Birder Robson, MD;  Location: [facility] ORS;  Service: Ophthalmology;  Laterality: Left;  [place] 00:29.6 AP% 18.4 CDE 5.44 FLUID PACK LOT #  [id number]   CATARACT EXTRACTION W/PHACO Right 04/30/2017   Procedure: CATARACT EXTRACTION PHACO AND INTRAOCULAR LENS PLACEMENT ([facility]);  Surgeon: Birder Robson, MD;  Location: [facility] ORS;  Service: Ophthalmology;  Laterality: Right;  [place] 00:43.1 AP% 13.7 CDE 5.91 Fluid Pack Lot # [id number] H   right breast bx  03/12/2019   TONSILLECTOMY AND ADENOIDECTOMY  1956   TUBAL LIGATION  1977   VEIN SURGERY     Family History  Problem Relation Age of Onset   Hyperlipidemia Mother    Hyperlipidemia Father    Hypertension Father    Hyperlipidemia Maternal Grandmother    Hyperlipidemia Maternal Grandfather    Cancer Cousin    Breast cancer Sister 43    Allergies: Doxycycline, Gabapentin, Sulfa drugs cross reactors, and Tetanus toxoids Current Outpatient Medications on File Prior to Visit  Medication Sig Dispense Refill   amLODipine (NORVASC) 2.5 MG tablet TAKE ONE TABLET BY MOUTH DAILY 90 tablet 3   aspirin 81 MG tablet Take 81 mg by mouth daily.     cetirizine (ZYRTEC) 10 MG tablet Take 10 mg by mouth at bedtime.     Cholecalciferol (VITAMIN D3) 1000 UNITS CAPS Take 1,000 Units by mouth daily.     estradiol (ESTRACE VAGINAL) 0.1 MG/GM vaginal cream Place 0.5 g vaginally 3 (three) times a week. 42.5 g 2   famotidine (PEPCID) 10 MG tablet Take 10 mg by mouth daily. As needed     Fluocinolone Acetonide  0.01 % OIL Apply 1 application topically See admin instructions. Apply to affected ear daily as needed for dermatisis     fluticasone (FLONASE) 50 MCG/ACT nasal spray PLACE 2 SPRAYS INTO THE NOSE DAILY. 16 g 4   meclizine (ANTIVERT) 12.5 MG tablet Take 12.5 mg by mouth 3 (three) times daily as needed for dizziness. Take 1 tablet by mouth 2x's daily for one week then increase to take 2 tabletss by mouth 2 times daily     NON FORMULARY Apply 1 application topically daily as needed (for dermatisis on face). Hydrocortisone 2.5%/Ketoconazole 2% Cream     nystatin (MYCOSTATIN/NYSTOP) powder Apply topically 2  (two) times daily. To rash until resolved. 15 g 0   Psyllium (METAMUCIL FIBER PO) Take by mouth. Take 1 tablet daily.     simvastatin (ZOCOR) 40 MG tablet TAKE ONE TABLET BY MOUTH EVERY NIGHT AT BEDTIME 90 tablet 3   telmisartan (MICARDIS) 80 MG tablet TAKE ONE TABLET BY MOUTH EVERY EVENING 90 tablet 1   No current facility-administered medications on file prior to visit.    Social History   Tobacco Use   Smoking status: Former    Types: Cigarettes    Quit date: 03/16/1999    Years since quitting: 22.8   Smokeless tobacco: Never  Vaping Use   Vaping Use: Never used  Substance Use Topics   Alcohol use: No    Alcohol/week: 0.0 standard drinks of alcohol   Drug use: No    Review of Systems  Constitutional:  Negative for chills and fever.  HENT:  Positive for congestion and postnasal drip.   Respiratory:  Negative for cough.   Cardiovascular:  Negative for chest pain and palpitations.  Gastrointestinal:  Negative for nausea and vomiting.  Psychiatric/Behavioral:  Negative for suicidal ideas.       Objective:    BP 120/86 (BP Location: Left Arm, Patient Position: Sitting, Cuff Size: Normal)   Pulse 65   Temp 97.9 F (36.6 C) (Oral)   Wt 203 lb 6.4 oz (92.3 kg)   SpO2 95%   BMI 32.83 kg/m    Physical Exam Vitals reviewed.  Constitutional:      Appearance: She is well-developed.  HENT:     Head: Normocephalic and atraumatic.     Right Ear: Hearing, tympanic membrane, ear canal and external ear normal. No decreased hearing noted. No drainage, swelling or tenderness. No middle ear effusion. No foreign body. Tympanic membrane is not erythematous or bulging.     Left Ear: Hearing, tympanic membrane, ear canal and external ear normal. No decreased hearing noted. No drainage, swelling or tenderness.  No middle ear effusion. No foreign body. Tympanic membrane is not erythematous or bulging.     Nose: Nose normal. No rhinorrhea.     Right Sinus: No maxillary sinus tenderness or  frontal sinus tenderness.     Left Sinus: No maxillary sinus tenderness or frontal sinus tenderness.     Mouth/Throat:     Pharynx: Uvula midline. No oropharyngeal exudate or posterior oropharyngeal erythema.     Tonsils: No tonsillar abscesses.  Eyes:     Conjunctiva/sclera: Conjunctivae normal.  Cardiovascular:     Rate and Rhythm: Regular rhythm.     Pulses: Normal pulses.     Heart sounds: Normal heart sounds.  Pulmonary:     Effort: Pulmonary effort is normal.     Breath sounds: Normal breath sounds. No wheezing, rhonchi or rales.  Lymphadenopathy:     Head:  Right side of head: No submental, submandibular, tonsillar, preauricular, posterior auricular or occipital adenopathy.     Left side of head: No submental, submandibular, tonsillar, preauricular, posterior auricular or occipital adenopathy.     Cervical: No cervical adenopathy.  Skin:    General: Skin is warm and dry.  Neurological:     Mental Status: She is alert.  Psychiatric:        Speech: Speech normal.        Behavior: Behavior normal.        Thought Content: Thought content normal.        Assessment & Plan:   Problem List Items Addressed This Visit       Respiratory   Allergic rhinitis due to pollen - Primary    Chronic nasal congestion, postnasal drip.  Of note previous allergy testing with ENT.  Previous sinus pain resolved with Augmentin 11/13/21.  Reassuring exam today.  Discussed pursuing CT sinus to further evaluate for anatomical etiology, nasal polyp.  We did discuss Singulair at length today.  After discussing black box warning as it relates suicidality, patient did not feel comfortable starting medication.  She may consider this at a later date.  For now, she will continue Zyrtec 10 mg, Flonase, and azelastine.        Relevant Orders   CT Maxillofacial WO CM   Sinusitis      I am having Jillian [name] maintain her Vitamin D3, aspirin, fluticasone, famotidine, cetirizine, Fluocinolone  Acetonide, NON FORMULARY, nystatin, Psyllium (METAMUCIL FIBER PO), estradiol, amLODipine, simvastatin, telmisartan, and meclizine.   No orders of the defined types were placed in this encounter.   Return precautions given.   Risks, benefits, and alternatives of the medications and treatment plan prescribed today were discussed, and patient expressed understanding.   Education regarding symptom management and diagnosis given to patient on AVS.  Continue to follow with Crecencio Mc, MD for routine health maintenance.   Jillian [name] and I agreed with plan.   Mable Paris, FNP

## 2022-01-22 NOTE — Assessment & Plan Note (Addendum)
Chronic nasal congestion, postnasal drip.  Of note previous allergy testing with ENT.  Previous sinus pain resolved with Augmentin 11/13/21.  Reassuring exam today.  Discussed pursuing CT sinus to further evaluate for anatomical etiology, nasal polyp.  We did discuss Singulair at length today.  After discussing black box warning as it relates suicidality, patient did not feel comfortable starting medication.  She may consider this at a later date.  For now, she will continue Zyrtec 10 mg, Flonase, and azelastine.

## 2022-01-22 NOTE — Patient Instructions (Signed)
We will pursue CT sinus to evaluate for structural reason for chronic congestion.  I suspect related to allergies.  Let us know if you dont hear back within a week in regards to an appointment being scheduled.   So that you are aware, if you are [facility] MyChart user , please pay attention to your MyChart messages as you may receive a MyChart message with a phone number to call and schedule this test/appointment own your own from our referral coordinator. This is a new process so I do not want you to miss this message.  If you are not a MyChart user, you will receive a phone call.    The future if you are discussed Singulair again, more than happy to.  Please continue Zyrtec, Flonase and azelastine (please confirm that this is was started from ENT).

## 2022-01-22 NOTE — Telephone Encounter (Signed)
Lft pt vm to call ofc to sch CT. thanks 

## 2022-01-26 ENCOUNTER — Ambulatory Visit (INDEPENDENT_AMBULATORY_CARE_PROVIDER_SITE_OTHER): Payer: Medicare Other

## 2022-01-26 DIAGNOSIS — E538 Deficiency of other specified B group vitamins: Secondary | ICD-10-CM

## 2022-01-26 MED ORDER — CYANOCOBALAMIN 1000 MCG/ML IJ SOLN
1000.0000 ug | Freq: Once | INTRAMUSCULAR | Status: AC
  Administered 2022-01-26: 1000 ug via INTRAMUSCULAR

## 2022-01-26 NOTE — Progress Notes (Signed)
Pt arrived for B12 injection, given in L deltoid. Pt tolerated injection well, showed no signs of distress nor voiced any concerns.  ?

## 2022-01-28 ENCOUNTER — Other Ambulatory Visit: Payer: Self-pay | Admitting: Internal Medicine

## 2022-01-30 ENCOUNTER — Ambulatory Visit: Payer: Medicare Other

## 2022-02-01 ENCOUNTER — Ambulatory Visit
Admission: RE | Admit: 2022-02-01 | Discharge: 2022-02-01 | Disposition: A | Discharge status: Home / Self Care | Payer: Medicare Other | Source: Ambulatory Visit | Attending: Family | Admitting: Family

## 2022-02-01 DIAGNOSIS — J329 Chronic sinusitis, unspecified: Secondary | ICD-10-CM | POA: Diagnosis not present

## 2022-02-01 DIAGNOSIS — J301 Allergic rhinitis due to pollen: Secondary | ICD-10-CM | POA: Diagnosis not present

## 2022-02-14 ENCOUNTER — Telehealth: Payer: Self-pay | Admitting: Internal Medicine

## 2022-02-14 NOTE — Telephone Encounter (Signed)
Copied from [place] [id number]. Topic: Medicare AWV >> Feb 14, 2022  9:55 AM Devoria Glassing wrote: Reason for CRM: Left message for patient to schedule Annual Wellness Visit.  Please schedule with Nurse Health Advisor Madelyn Brunner, LPN at [facility]. This appt can be telephone or office visit.  Please call [phone] ask for [facility]

## 2022-02-27 ENCOUNTER — Ambulatory Visit: Payer: Medicare Other

## 2022-03-01 ENCOUNTER — Ambulatory Visit (INDEPENDENT_AMBULATORY_CARE_PROVIDER_SITE_OTHER): Payer: Medicare Other

## 2022-03-01 DIAGNOSIS — E538 Deficiency of other specified B group vitamins: Secondary | ICD-10-CM

## 2022-03-01 MED ORDER — CYANOCOBALAMIN 1000 MCG/ML IJ SOLN
1000.0000 ug | Freq: Once | INTRAMUSCULAR | Status: AC
  Administered 2022-03-01: 1000 ug via INTRAMUSCULAR

## 2022-03-01 NOTE — Progress Notes (Signed)
Patient was administered a B12 injection in her Left Arm. Patient tolerated B12 injection well. Patient did not show signs of distress or voice any concerns.

## 2022-03-02 ENCOUNTER — Ambulatory Visit (INDEPENDENT_AMBULATORY_CARE_PROVIDER_SITE_OTHER): Payer: Medicare Other

## 2022-03-02 VITALS — Ht 66.0 in | Wt 203.0 lb

## 2022-03-02 DIAGNOSIS — Z0000 Encounter for general adult medical examination without abnormal findings: Secondary | ICD-10-CM

## 2022-03-02 NOTE — Progress Notes (Addendum)
Subjective:   Jillian [name] is a 72 y.o. female who presents for Medicare Annual (Subsequent) preventive examination.  Review of Systems    No ROS.  Medicare Wellness Virtual Visit.  Visual/audio telehealth visit, UTA vital signs.   See social history for additional risk factors.   Cardiac Risk Factors include: advanced age (>40mn, >>60women)     Objective:    Today's Vitals   03/02/22 1005  Weight: 203 lb (92.1 kg)  Height: '5\' 6"'$  (1.676 m)   Body mass index is 32.77 kg/m.     03/02/2022   10:12 AM 03/01/2021    9:09 AM 02/29/2020    9:17 AM 09/25/2019    3:59 PM 02/25/2019    9:07 AM 02/21/2018   10:46 AM 04/09/2017    6:57 AM  Advanced Directives  Does Patient Have a Medical Advance Directive? Yes Yes Yes No Yes Yes Yes  Type of AParamedicof AWingateLiving will HClarkewill  HSun Does patient want to make changes to medical advance directive? No - Patient declined No - Patient declined No - Patient declined  No - Patient declined No - Patient declined No - Patient declined  Copy of [place] Chart? No - copy requested No - copy requested   No - copy requested No - copy requested No - copy requested    Current Medications (verified) Outpatient Encounter Medications as of 03/02/2022  Medication Sig   amLODipine (NORVASC) 2.5 MG tablet TAKE ONE TABLET BY MOUTH DAILY   aspirin 81 MG tablet Take 81 mg by mouth daily.   cetirizine (ZYRTEC) 10 MG tablet Take 10 mg by mouth at bedtime.   Cholecalciferol (VITAMIN D3) 1000 UNITS CAPS Take 1,000 Units by mouth daily.   estradiol (ESTRACE VAGINAL) 0.1 MG/GM vaginal cream Place 0.5 g vaginally 3 (three) times a week.   famotidine (PEPCID) 10 MG tablet Take 10 mg by mouth daily. As needed   Fluocinolone Acetonide 0.01 % OIL Apply 1 application  topically See admin instructions. Apply to affected ear daily as needed for dermatisis   fluticasone (FLONASE) 50 MCG/ACT nasal spray PLACE 2 SPRAYS INTO THE NOSE DAILY.   meclizine (ANTIVERT) 12.5 MG tablet Take 12.5 mg by mouth 3 (three) times daily as needed for dizziness. Take 1 tablet by mouth 2x's daily for one week then increase to take 2 tabletss by mouth 2 times daily   NON FORMULARY Apply 1 application topically daily as needed (for dermatisis on face). Hydrocortisone 2.5%/Ketoconazole 2% Cream   nystatin (MYCOSTATIN/NYSTOP) powder Apply topically 2 (two) times daily. To rash until resolved.   Psyllium (METAMUCIL FIBER PO) Take by mouth. Take 1 tablet daily.   simvastatin (ZOCOR) 40 MG tablet TAKE ONE TABLET BY MOUTH EVERY NIGHT AT BEDTIME   telmisartan (MICARDIS) 80 MG tablet TAKE ONE TABLET BY MOUTH EVERY EVENING   No facility-administered encounter medications on file as of 03/02/2022.    Allergies (verified) Doxycycline, Gabapentin, Sulfa drugs cross reactors, and Tetanus toxoids   History: Past Medical History:  Diagnosis Date   Allergy    seasonal rhinitis   Arthritis    right shoulder,  no intervention   Cancer (HCC)    basal cell    Chronic right maxillary sinusitis 08/13/2018   GERD (gastroesophageal reflux disease)    Hyperlipidemia    Hypertension  Irritable bowel syndrome (IBS) Oct 2011   last colonoscopy showed diverticulum, no polyps   Neuropathy    FEET   Thrombophlebitis of left lower extremity ([place]) 09/29/2019   Vertigo    OCCAS   Past Surgical History:  Procedure Laterality Date   BREAST BIOPSY Right 03/12/2019   [place] bx neg   CATARACT EXTRACTION W/PHACO Left 04/09/2017   Procedure: CATARACT EXTRACTION PHACO AND INTRAOCULAR LENS PLACEMENT ([place]);  Surgeon: Birder Robson, MD;  Location: [facility] ORS;  Service: Ophthalmology;  Laterality: Left;  [place] 00:29.6 AP% 18.4 CDE 5.44 FLUID PACK LOT # [id number]   CATARACT EXTRACTION W/PHACO Right 04/30/2017    Procedure: CATARACT EXTRACTION PHACO AND INTRAOCULAR LENS PLACEMENT ([facility]);  Surgeon: Birder Robson, MD;  Location: [facility] ORS;  Service: Ophthalmology;  Laterality: Right;  [place] 00:43.1 AP% 13.7 CDE 5.91 Fluid Pack Lot # [id number]   right breast bx  03/12/2019   TONSILLECTOMY AND ADENOIDECTOMY  1956   TUBAL LIGATION  1977   VEIN SURGERY     Family History  Problem Relation Age of Onset   Hyperlipidemia Mother    Hyperlipidemia Father    Hypertension Father    Hyperlipidemia Maternal Grandmother    Hyperlipidemia Maternal Grandfather    Cancer Cousin    Breast cancer Sister 75   Social History   Socioeconomic History   Marital status: Divorced    Spouse name: Not on file   Number of children: Not on file   Years of education: Not on file   Highest education level: Not on file  Occupational History   Occupation: cutsodial    Employer: [employer]    Comment: works at [employer]  Tobacco Use   Smoking status: Former    Types: Cigarettes    Quit date: 03/16/1999    Years since quitting: 22.9   Smokeless tobacco: Never  Vaping Use   Vaping Use: Never used  Substance and Sexual Activity   Alcohol use: No    Alcohol/week: 0.0 standard drinks of alcohol   Drug use: No   Sexual activity: Not Currently  Other Topics Concern   Not on file  Social History Narrative   Not on file   Social Determinants of Health   Financial Resource Strain: Low Risk  (03/02/2022)   Overall Financial Resource Strain (CARDIA)    Difficulty of Paying Living Expenses: Not hard at all  Food Insecurity: No Food Insecurity (03/02/2022)   Hunger Vital Sign    Worried About Running Out of Food in the Last Year: Never true    [place] in the Last Year: Never true  Transportation Needs: No Transportation Needs (03/02/2022)   PRAPARE - Hydrologist (Medical): No    Lack of Transportation (Non-Medical): No  Physical Activity: Sufficiently Active (03/02/2022)   Exercise Vital Sign     Days of Exercise per Week: 5 days    Minutes of Exercise per Session: 30 min  Stress: Stress Concern Present (03/02/2022)   [place]    Feeling of Stress : To some extent  Social Connections: Unknown (03/02/2022)   Social Connection and Isolation Panel [NHANES]    Frequency of Communication with Friends and Family: More than three times a week    Frequency of Social Gatherings with Friends and Family: More than three times a week    Attends Religious Services: Not on file    Active Member of Clubs or Organizations: Yes  Attends Archivist Meetings: More than 4 times per year    Marital Status: Divorced    Tobacco Counseling Counseling given: Not Answered   Clinical Intake:  Pre-visit preparation completed: Yes           How often do you need to have someone help you when you read instructions, pamphlets, or other written materials from your doctor or pharmacy?: 1 - Never    Interpreter Needed?: No      Activities of Daily Living    03/02/2022   10:08 AM 02/26/2022    6:55 PM  In your present state of health, do you have any difficulty performing the following activities:  Hearing? 0 0  Vision? 0 0  Difficulty concentrating or making decisions? 0 0  Walking or climbing stairs? 0 0  Dressing or bathing? 0 0  Doing errands, shopping? 0 0  Preparing Food and eating ? N N  Using the Toilet? N N  In the past six months, have you accidently leaked urine? N N  Do you have problems with loss of bowel control? N N  Managing your Medications? N N  Managing your Finances? N N  Housekeeping or managing your Housekeeping? N N    Patient Care Team: Crecencio Mc, MD as PCP - General (Internal Medicine)  Indicate any recent Medical Services you may have received from other than [facility] providers in the past year (date may be approximate).     Assessment:   This is a routine wellness examination  for [facility].  I connected with  Ameshia Pewitt Bielicki on 03/02/22 by a audio enabled telemedicine application and verified that I am speaking with the correct person using two identifiers.  Patient Location: Home  Provider Location: Office/Clinic  I discussed the limitations of evaluation and management by telemedicine. The patient expressed understanding and agreed to proceed.   Hearing/Vision screen Hearing Screening - Comments:: Patient is able to hear conversational tones without difficulty.  No issues reported.   Vision Screening - Comments:: Followed by [facility] Wears corrective lenses Cataract extraction, bilateral    Dietary issues and exercise activities discussed: Current Exercise Habits: Home exercise routine, Type of exercise: walking (water aerobics), Intensity: Mild   Goals Addressed               This Visit's Progress     Patient Stated     Increase physical activity (pt-stated)        Resume water aerobics  Walk for exercise Lose about 10lbs       Depression Screen    03/02/2022   10:07 AM 01/22/2022    3:14 PM 11/03/2021    1:07 PM 10/23/2021    9:03 AM 08/03/2021    9:19 AM 03/01/2021    9:12 AM 01/17/2021    9:36 AM  PHQ 2/9 Scores  PHQ - 2 Score 0 0 0 0 0 0 0    Fall Risk    03/02/2022   10:06 AM 02/26/2022    6:55 PM 01/22/2022    3:14 PM 11/03/2021    1:07 PM 10/23/2021    9:03 AM  [place] in the past year? 0 0 0 0 0  Number falls in past yr:  0 0 0   Injury with Fall?  0 0 0   Risk for fall due to :   No Fall Risks History of fall(s);No Fall Risks No Fall Risks  Follow up Falls evaluation  completed;Falls prevention discussed  Falls evaluation completed Falls evaluation completed Falls evaluation completed    FALL RISK PREVENTION PERTAINING TO THE HOME: Home free of loose throw rugs in walkways, pet beds, electrical cords, etc? Yes  Adequate lighting in your home to reduce risk of falls? Yes   ASSISTIVE DEVICES  UTILIZED TO PREVENT FALLS: Life alert? No  Use of a cane, walker or w/c? No  Grab bars in the bathroom? Yes  Shower chair or bench in shower? Yes  Comfort chair height toilet? Yes   TIMED UP AND GO: Was the test performed? No .    Cognitive Function:    02/14/2017    3:36 PM 02/07/2016    8:29 AM  MMSE - Mini Mental State Exam  Orientation to time 5 5  Orientation to Place 5 5  Registration 3 3  Attention/ Calculation 5 5  Recall 3 3  Language- name 2 objects 2 2  Language- repeat 1 1  Language- follow 3 step command 3 3  Language- read & follow direction 1 1  Write a sentence 1 1  Copy design 1 1  Total score 30 30        03/02/2022   10:12 AM 02/29/2020    9:24 AM 02/25/2019    9:17 AM 02/21/2018   10:47 AM  6CIT Screen  What Year? 0 points 0 points 0 points 0 points  What month? 0 points 0 points 0 points 0 points  What time? 0 points 0 points 0 points 0 points  Count back from 20  0 points 0 points 0 points  Months in reverse 0 points 0 points 0 points 0 points  Repeat phrase   0 points 0 points  Total Score   0 points 0 points    Immunizations Immunization History  Administered Date(s) Administered   Fluad Quad(high Dose 65+) 11/24/2018, 12/01/2019   Hep A / Hep B 01/09/2017, 02/14/2017, 07/09/2017   Influenza Split 11/28/2020   Influenza, High Dose Seasonal PF 11/29/2014, 12/27/2015, 11/20/2016   Influenza,inj,Quad PF,6+ Mos 12/06/2021   Influenza,inj,quad, With Preservative 12/06/2017   Influenza-Unspecified 11/18/2011, 11/10/2012, 11/25/2013, 12/04/2017   Moderna Sars-Covid-2 Vaccination 04/07/2019, 05/05/2019, 12/26/2019, 12/12/2020   PNEUMOCOCCAL CONJUGATE-20 10/23/2021   Pneumococcal Conjugate-13 11/29/2014   Pneumococcal Polysaccharide-23 02/29/2016   Zoster Recombinat (Shingrix) 09/22/2018, 12/01/2018, 12/21/2018   Tdap- declined per patient. Patient reports she cannot take. NHA is unable to remove from chart.   Labs- followed by  pcp.  Screening Tests Health Maintenance  Topic Date Due   DTaP/Tdap/Td (1 - Tdap) Never done   COVID-19 Vaccine (5 - 2023-24 season) 03/09/2022 (Originally 10/27/2021)   HEMOGLOBIN A1C  03/29/2022 (Originally 10/19/2021)   FOOT EXAM  04/25/2022 (Originally 08/01/1960)   MAMMOGRAM  04/07/2022   Diabetic kidney evaluation - Urine ACR  04/21/2022   OPHTHALMOLOGY EXAM  05/28/2022   Fecal DNA (Cologuard)  09/04/2022   Diabetic kidney evaluation - eGFR measurement  10/19/2022   Medicare Annual Wellness (AWV)  03/03/2023   Pneumonia Vaccine 71+ Years old  Completed   INFLUENZA VACCINE  Completed   DEXA SCAN  Completed   Hepatitis C Screening  Completed   Zoster Vaccines- Shingrix  Completed   HPV VACCINES  Aged Out   COLONOSCOPY (Pts 45-59yr Insurance coverage will need to be confirmed)  Discontinued   Health Maintenance Health Maintenance Due  Topic Date Due   DTaP/Tdap/Td (1 - Tdap) Never done   Mammogram due 04/07/21. Number provided for scheduling [phone]  Lung Cancer Screening: (Low Dose CT Chest recommended if Age 55-80 years, 30 pack-year currently smoking OR have quit w/in 15years.) does not qualify.   Hepatitis C Screening: Completed 11/2014.  Vision Screening: Recommended annual ophthalmology exams for early detection of glaucoma and other disorders of the eye.  Dental Screening: Recommended annual dental exams for proper oral hygiene. Partials.   Community Resource Referral / Chronic Care Management: CRR required this visit?  No   CCM required this visit?  No      Plan:     I have personally reviewed and noted the following in the patient's chart:   Medical and social history Use of alcohol, tobacco or illicit drugs  Current medications and supplements including opioid prescriptions. Patient is not currently taking opioid prescriptions. Functional ability and status Nutritional status Physical activity Advanced directives List of other  physicians Hospitalizations, surgeries, and ER visits in previous 12 months Vitals Screenings to include cognitive, depression, and falls Referrals and appointments  In addition, I have reviewed and discussed with patient certain preventive protocols, quality metrics, and best practice recommendations. A written personalized care plan for preventive services as well as general preventive health recommendations were provided to patient.     [place], LPN   0/07/2692    I have reviewed the above information and agree with above.   Deborra Medina, MD

## 2022-03-02 NOTE — Patient Instructions (Addendum)
Jillian [name] , Thank you for taking time to come for your Medicare Wellness Visit. I appreciate your ongoing commitment to your health goals. Please review the following plan we discussed and let me know if I can assist you in the future.   These are the goals we discussed:  Goals       Patient Stated     Increase physical activity (pt-stated)      Resume water aerobics  Walk for exercise Lose about 10lbs        This is a list of the screening recommended for you and due dates:  Health Maintenance  Topic Date Due   DTaP/Tdap/Td vaccine (1 - Tdap) Never done   COVID-19 Vaccine (5 - 2023-24 season) 03/09/2022*   Hemoglobin A1C  03/29/2022*   Complete foot exam   04/25/2022*   Mammogram  04/07/2022   Yearly kidney health urinalysis for diabetes  04/21/2022   Eye exam for diabetics  05/28/2022   Cologuard (Stool DNA test)  09/04/2022   Yearly kidney function blood test for diabetes  10/19/2022   Medicare Annual Wellness Visit  03/03/2023   Pneumonia Vaccine  Completed   Flu Shot  Completed   DEXA scan (bone density measurement)  Completed   Hepatitis C Screening: USPSTF Recommendation to screen - Ages 25-79 yo.  Completed   Zoster (Shingles) Vaccine  Completed   HPV Vaccine  Aged Out   Colon Cancer Screening  Discontinued  *Topic was postponed. The date shown is not the original due date.    Advanced directives: End of life planning; Advance aging; Advanced directives discussed.  Copy of current HCPOA/Living Will requested.    Conditions/risks identified: none new  Next appointment: Follow up in one year for your annual wellness visit    Preventive Care 65 Years and Older, Female Preventive care refers to lifestyle choices and visits with your health care provider that can promote health and wellness. What does preventive care include? A yearly physical exam. This is also called an annual well check. Dental exams once or twice a year. Routine eye exams. Ask your health  care provider how often you should have your eyes checked. Personal lifestyle choices, including: Daily care of your teeth and gums. Regular physical activity. Eating a healthy diet. Avoiding tobacco and drug use. Limiting alcohol use. Practicing safe sex. Taking low-dose aspirin every day. Taking vitamin and mineral supplements as recommended by your health care provider. What happens during an annual well check? The services and screenings done by your health care provider during your annual well check will depend on your age, overall health, lifestyle risk factors, and family history of disease. Counseling  Your health care provider may ask you questions about your: Alcohol use. Tobacco use. Drug use. Emotional well-being. Home and relationship well-being. Sexual activity. Eating habits. History of falls. Memory and ability to understand (cognition). Work and work Statistician. Reproductive health. Screening  You may have the following tests or measurements: Height, weight, and BMI. Blood pressure. Lipid and cholesterol levels. These may be checked every 5 years, or more frequently if you are over 20 years old. Skin check. Lung cancer screening. You may have this screening every year starting at age 62 if you have a 30-pack-year history of smoking and currently smoke or have quit within the past 15 years. Fecal occult blood test (FOBT) of the stool. You may have this test every year starting at age 48. Flexible sigmoidoscopy or colonoscopy. You may have a sigmoidoscopy  every 5 years or a colonoscopy every 10 years starting at age 53. Hepatitis C blood test. Hepatitis B blood test. Sexually transmitted disease (STD) testing. Diabetes screening. This is done by checking your blood sugar (glucose) after you have not eaten for a while (fasting). You may have this done every 1-3 years. Bone density scan. This is done to screen for osteoporosis. You may have this done starting at  age 71. Mammogram. This may be done every 1-2 years. Talk to your health care provider about how often you should have regular mammograms. Talk with your health care provider about your test results, treatment options, and if necessary, the need for more tests. Vaccines  Your health care provider may recommend certain vaccines, such as: Influenza vaccine. This is recommended every year. Tetanus, diphtheria, and acellular pertussis (Tdap, Td) vaccine. You may need a Td booster every 10 years. Zoster vaccine. You may need this after age 5. Pneumococcal 13-valent conjugate (PCV13) vaccine. One dose is recommended after age 13. Pneumococcal polysaccharide (PPSV23) vaccine. One dose is recommended after age 36. Talk to your health care provider about which screenings and vaccines you need and how often you need them. This information is not intended to replace advice given to you by your health care provider. Make sure you discuss any questions you have with your health care provider. Document Released: 03/11/2015 Document Revised: 11/02/2015 Document Reviewed: 12/14/2014 Elsevier Interactive Patient Education  2017 [place] Prevention in the Home Falls can cause injuries. They can happen to people of all ages. There are many things you can do to make your home safe and to help prevent falls. What can I do on the outside of my home? Regularly fix the edges of walkways and driveways and fix any cracks. Remove anything that might make you trip as you walk through a door, such as a raised step or threshold. Trim any bushes or trees on the path to your home. Use bright outdoor lighting. Clear any walking paths of anything that might make someone trip, such as rocks or tools. Regularly check to see if handrails are loose or broken. Make sure that both sides of any steps have handrails. Any raised decks and porches should have guardrails on the edges. Have any leaves, snow, or ice cleared  regularly. Use sand or salt on walking paths during winter. Clean up any spills in your garage right away. This includes oil or grease spills. What can I do in the bathroom? Use night lights. Install grab bars by the toilet and in the tub and shower. Do not use towel bars as grab bars. Use non-skid mats or decals in the tub or shower. If you need to sit down in the shower, use a plastic, non-slip stool. Keep the floor dry. Clean up any water that spills on the floor as soon as it happens. Remove soap buildup in the tub or shower regularly. Attach bath mats securely with double-sided non-slip rug tape. Do not have throw rugs and other things on the floor that can make you trip. What can I do in the bedroom? Use night lights. Make sure that you have a light by your bed that is easy to reach. Do not use any sheets or blankets that are too big for your bed. They should not hang down onto the floor. Have a firm chair that has side arms. You can use this for support while you get dressed. Do not have throw rugs and other things  on the floor that can make you trip. What can I do in the kitchen? Clean up any spills right away. Avoid walking on wet floors. Keep items that you use a lot in easy-to-reach places. If you need to reach something above you, use a strong step stool that has a grab bar. Keep electrical cords out of the way. Do not use floor polish or wax that makes floors slippery. If you must use wax, use non-skid floor wax. Do not have throw rugs and other things on the floor that can make you trip. What can I do with my stairs? Do not leave any items on the stairs. Make sure that there are handrails on both sides of the stairs and use them. Fix handrails that are broken or loose. Make sure that handrails are as long as the stairways. Check any carpeting to make sure that it is firmly attached to the stairs. Fix any carpet that is loose or worn. Avoid having throw rugs at the top or  bottom of the stairs. If you do have throw rugs, attach them to the floor with carpet tape. Make sure that you have a light switch at the top of the stairs and the bottom of the stairs. If you do not have them, ask someone to add them for you. What else can I do to help prevent falls? Wear shoes that: Do not have high heels. Have rubber bottoms. Are comfortable and fit you well. Are closed at the toe. Do not wear sandals. If you use a stepladder: Make sure that it is fully opened. Do not climb a closed stepladder. Make sure that both sides of the stepladder are locked into place. Ask someone to hold it for you, if possible. Clearly mark and make sure that you can see: Any grab bars or handrails. First and last steps. Where the edge of each step is. Use tools that help you move around (mobility aids) if they are needed. These include: Canes. Walkers. Scooters. Crutches. Turn on the lights when you go into a dark area. Replace any light bulbs as soon as they burn out. Set up your furniture so you have a clear path. Avoid moving your furniture around. If any of your floors are uneven, fix them. If there are any pets around you, be aware of where they are. Review your medicines with your doctor. Some medicines can make you feel dizzy. This can increase your chance of falling. Ask your doctor what other things that you can do to help prevent falls. This information is not intended to replace advice given to you by your health care provider. Make sure you discuss any questions you have with your health care provider. Document Released: 12/09/2008 Document Revised: 07/21/2015 Document Reviewed: 03/19/2014 Elsevier Interactive Patient Education  2017 [employer].

## 2022-03-08 DIAGNOSIS — R202 Paresthesia of skin: Secondary | ICD-10-CM | POA: Diagnosis not present

## 2022-03-08 DIAGNOSIS — R200 Anesthesia of skin: Secondary | ICD-10-CM | POA: Diagnosis not present

## 2022-03-08 DIAGNOSIS — R42 Dizziness and giddiness: Secondary | ICD-10-CM | POA: Diagnosis not present

## 2022-03-08 DIAGNOSIS — R278 Other lack of coordination: Secondary | ICD-10-CM | POA: Diagnosis not present

## 2022-03-22 ENCOUNTER — Other Ambulatory Visit: Payer: Self-pay | Admitting: Internal Medicine

## 2022-04-02 ENCOUNTER — Ambulatory Visit: Payer: Medicare Other

## 2022-04-05 ENCOUNTER — Encounter: Payer: Self-pay | Admitting: Internal Medicine

## 2022-04-05 ENCOUNTER — Ambulatory Visit (INDEPENDENT_AMBULATORY_CARE_PROVIDER_SITE_OTHER): Payer: Medicare Other | Admitting: Internal Medicine

## 2022-04-05 VITALS — BP 138/88 | HR 63 | Temp 98.0°F | Ht 66.0 in | Wt 208.0 lb

## 2022-04-05 DIAGNOSIS — J0100 Acute maxillary sinusitis, unspecified: Secondary | ICD-10-CM | POA: Diagnosis not present

## 2022-04-05 DIAGNOSIS — J0120 Acute ethmoidal sinusitis, unspecified: Secondary | ICD-10-CM | POA: Diagnosis not present

## 2022-04-05 DIAGNOSIS — E538 Deficiency of other specified B group vitamins: Secondary | ICD-10-CM

## 2022-04-05 DIAGNOSIS — R051 Acute cough: Secondary | ICD-10-CM

## 2022-04-05 LAB — POCT INFLUENZA A/B
Influenza A, POC: NEGATIVE
Influenza B, POC: NEGATIVE

## 2022-04-05 MED ORDER — ONDANSETRON HCL 4 MG PO TABS: 4.0000 mg | ORAL_TABLET | Freq: Three times a day (TID) | ORAL | 0 refills | Status: DC | PRN

## 2022-04-05 MED ORDER — LEVOFLOXACIN 500 MG PO TABS: 500.0000 mg | ORAL_TABLET | Freq: Every day | ORAL | 0 refills | Status: AC

## 2022-04-05 MED ORDER — CYANOCOBALAMIN 1000 MCG/ML IJ SOLN
1000.0000 ug | Freq: Once | INTRAMUSCULAR | Status: AC
  Administered 2022-04-05: 1000 ug via INTRAMUSCULAR

## 2022-04-05 MED ORDER — PREDNISONE 10 MG PO TABS: ORAL_TABLET | ORAL | 0 refills | Status: DC

## 2022-04-05 NOTE — Patient Instructions (Addendum)
I  am treating you for bacterial sinusitis   I am prescribing an antibiotic (levaquin)   and a prednisone taper To manage the infection and the inflammation in your ear/sinuses.   I also advise use of the following OTC meds to help with your other symptoms.   Take generic OTC benadryl 25 mg at bedtime  for the drainage,  Afrin nasal spray every 12 houRs for 5 days , THEN STOP ,    Use Delsym (otc)   FOR THE  COUGH.    Please continue to eat yougurt  or take a probiotic ( Align, Flora que or Culturelle) OR A GENERIC EQUIVALENT for three weeks since you are taking an  antibiotic to prevent a very serious antibiotic associated infection  Called clostridium dificile colitis that can cause diarrhea, multi organ failure, sepsis and death if not managed.

## 2022-04-05 NOTE — Progress Notes (Signed)
Subjective:  Patient ID: Jillian [name], female    DOB: [birth year]  Age: 72 y.o. MRN: [id number]  CC: The primary encounter diagnosis was Acute cough. Diagnoses of Acute non-recurrent ethmoidal sinusitis, B12 deficiency, and Acute non-recurrent maxillary sinusitis were also pertinent to this visit.   HPI KEELEY [name] presents for  Chief Complaint  Patient presents with   Cough    Cough, scratchy throat, chills, sinus congestion, chest congestion, body aches x 5 days. Neg covid on Monday.    6 day history of symptoms .  Cough is productive of purulent sputum     Outpatient Medications Prior to Visit  Medication Sig Dispense Refill   amLODipine (NORVASC) 2.5 MG tablet TAKE ONE TABLET BY MOUTH DAILY 90 tablet 3   aspirin 81 MG tablet Take 81 mg by mouth daily.     cetirizine (ZYRTEC) 10 MG tablet Take 10 mg by mouth at bedtime.     Cholecalciferol (VITAMIN D3) 1000 UNITS CAPS Take 1,000 Units by mouth daily.     estradiol (ESTRACE VAGINAL) 0.1 MG/GM vaginal cream Place 0.5 g vaginally 3 (three) times a week. 42.5 g 2   famotidine (PEPCID) 10 MG tablet Take 10 mg by mouth daily. As needed     Fluocinolone Acetonide 0.01 % OIL Apply 1 application topically See admin instructions. Apply to affected ear daily as needed for dermatisis     fluticasone (FLONASE) 50 MCG/ACT nasal spray PLACE 2 SPRAYS INTO THE NOSE DAILY. 16 g 4   meclizine (ANTIVERT) 12.5 MG tablet Take 12.5 mg by mouth 3 (three) times daily as needed for dizziness. Take 1 tablet by mouth 2x's daily for one week then increase to take 2 tabletss by mouth 2 times daily     NON FORMULARY Apply 1 application topically daily as needed (for dermatisis on face). Hydrocortisone 2.5%/Ketoconazole 2% Cream     nystatin (MYCOSTATIN/NYSTOP) powder Apply topically 2 (two) times daily. To rash until resolved. 15 g 0   Psyllium (METAMUCIL FIBER PO) Take by mouth. Take 1 tablet daily.     simvastatin (ZOCOR) 40 MG tablet TAKE ONE  TABLET BY MOUTH EVERY NIGHT AT BEDTIME 90 tablet 3   telmisartan (MICARDIS) 80 MG tablet TAKE ONE TABLET BY MOUTH EVERY EVENING 90 tablet 1   No facility-administered medications prior to visit.    Review of Systems;  Patient denies headache, fevers, malaise, unintentional weight loss, skin rash, eye pain, sinus congestion and sinus pain, sore throat, dysphagia,  hemoptysis , cough, dyspnea, wheezing, chest pain, palpitations, orthopnea, edema, abdominal pain, nausea, melena, diarrhea, constipation, flank pain, dysuria, hematuria, urinary  Frequency, nocturia, numbness, tingling, seizures,  Focal weakness, Loss of consciousness,  Tremor, insomnia, depression, anxiety, and suicidal ideation.      Objective:  BP 138/88   Pulse 63   Temp 98 F (36.7 C) (Oral)   Ht 5' 6"$  (1.676 m)   Wt 208 lb (94.3 kg)   SpO2 96%   BMI 33.57 kg/m   BP Readings from Last 3 Encounters:  04/05/22 138/88  01/22/22 120/86  12/21/21 119/77    Wt Readings from Last 3 Encounters:  04/05/22 208 lb (94.3 kg)  03/02/22 203 lb (92.1 kg)  01/22/22 203 lb 6.4 oz (92.3 kg)    Physical Exam Vitals reviewed.  Constitutional:      General: She is not in acute distress.    Appearance: Normal appearance. She is normal weight. She is not ill-appearing, toxic-appearing or diaphoretic.  HENT:  Head: Normocephalic.     Right Ear: Tympanic membrane normal.     Left Ear: Tympanic membrane normal.     Nose: Congestion present.     Mouth/Throat:     Mouth: Mucous membranes are moist.     Pharynx: No oropharyngeal exudate or posterior oropharyngeal erythema.  Eyes:     General: No scleral icterus.       Right eye: No discharge.        Left eye: No discharge.     Conjunctiva/sclera: Conjunctivae normal.  Cardiovascular:     Rate and Rhythm: Normal rate and regular rhythm.     Pulses: Normal pulses.  Pulmonary:     Effort: Pulmonary effort is normal.     Breath sounds: Normal breath sounds.   Musculoskeletal:        General: Normal range of motion.     Cervical back: No tenderness.  Lymphadenopathy:     Cervical: Cervical adenopathy present.  Skin:    General: Skin is warm and dry.  Neurological:     General: No focal deficit present.     Mental Status: She is alert and oriented to person, place, and time. Mental status is at baseline.  Psychiatric:        Mood and Affect: Mood normal.        Behavior: Behavior normal.        Thought Content: Thought content normal.        Judgment: Judgment normal.     Lab Results  Component Value Date   HGBA1C 5.6 04/21/2021   HGBA1C 5.7 10/19/2020   HGBA1C 5.8 04/20/2020    Lab Results  Component Value Date   CREATININE 1.32 (H) 10/18/2021   CREATININE 1.10 04/21/2021   CREATININE 1.14 10/19/2020    Lab Results  Component Value Date   WBC 8.1 04/21/2021   HGB 12.9 04/21/2021   HCT 38.4 04/21/2021   PLT 219.0 04/21/2021   GLUCOSE 84 10/18/2021   CHOL 159 10/18/2021   TRIG 153.0 (H) 10/18/2021   HDL 57.80 10/18/2021   LDLDIRECT 87.0 04/21/2021   LDLCALC 71 10/18/2021   ALT 11 10/18/2021   AST 12 10/18/2021   NA 142 10/18/2021   K 4.6 10/18/2021   CL 105 10/18/2021   CREATININE 1.32 (H) 10/18/2021   BUN 28 (H) 10/18/2021   CO2 21 10/18/2021   TSH 2.21 04/21/2021   HGBA1C 5.6 04/21/2021   MICROALBUR <0.7 04/21/2021    CT Maxillofacial WO CM  Result Date: 02/01/2022 CLINICAL DATA:  Sinus mass/polyp. Chronic sinusitis with postnasal drip. EXAM: CT MAXILLOFACIAL WITHOUT CONTRAST TECHNIQUE: Multidetector CT imaging of the maxillofacial structures was performed. Multiplanar CT image reconstructions were also generated. RADIATION DOSE REDUCTION: This exam was performed according to the departmental dose-optimization program which includes automated exposure control, adjustment of the mA and/or kV according to patient size and/or use of iterative reconstruction technique. COMPARISON:  None Available. FINDINGS: Osseous:  No fracture or dislocation.  No focal osseous lesion. Orbits: 2 1 Sinuses: Paranasal sinuses are clear. No air-fluid levels. No mucosal thickening. No focal mass. Soft tissues: Negative. Limited intracranial: Intracranial atherosclerotic calcifications are present. No acute abnormalities. IMPRESSION: Paranasal sinuses are clear. Electronically Signed   By: Ronney Asters M.D.   On: 02/01/2022 16:31    Assessment & Plan:  .Acute cough -     Respiratory virus panel -     POCT Influenza A/B  Acute non-recurrent ethmoidal sinusitis  B12 deficiency -  Cyanocobalamin  Acute non-recurrent maxillary sinusitis Assessment & Plan: Flu and COVD tests are negative. Given chronicity of symptoms, development of facial pain and exam consistent with bacterial URI,  Will treat with empiric antibiotics, decongestants, prednisone taper, cough suppressant and saline lavage.   Probiotic advised     Other orders -     levoFLOXacin; Take 1 tablet (500 mg total) by mouth daily for 7 days.  Dispense: 7 tablet; Refill: 0 -     predniSONE; 6 tablets on Day 1 , then reduce by 1 tablet daily until gone  Dispense: 21 tablet; Refill: 0 -     Ondansetron HCl; Take 1 tablet (4 mg total) by mouth every 8 (eight) hours as needed for nausea or vomiting.  Dispense: 20 tablet; Refill: 0     I provided 30 minutes of face-to-face time during this encounter reviewing patient's last visit with me, patient's  most recent visit with cardiology,  nephrology,  and neurology,  recent surgical and non surgical procedures, previous  labs and imaging studies, counseling on currently addressed issues,  and post visit ordering to diagnostics and therapeutics .   Follow-up: No follow-ups on file.   Crecencio Mc, MD

## 2022-04-08 LAB — RESPIRATORY VIRUS PANEL

## 2022-04-08 NOTE — Assessment & Plan Note (Signed)
Flu and COVD tests are negative. Given chronicity of symptoms, development of facial pain and exam consistent with bacterial URI,  Will treat with empiric antibiotics, decongestants, prednisone taper, cough suppressant and saline lavage.   Probiotic advised

## 2022-04-16 ENCOUNTER — Other Ambulatory Visit: Payer: Self-pay | Admitting: Internal Medicine

## 2022-04-16 DIAGNOSIS — Z1231 Encounter for screening mammogram for malignant neoplasm of breast: Secondary | ICD-10-CM

## 2022-04-25 ENCOUNTER — Ambulatory Visit: Payer: Medicare Other | Admitting: Internal Medicine

## 2022-04-25 DIAGNOSIS — N1832 Chronic kidney disease, stage 3b: Secondary | ICD-10-CM | POA: Diagnosis not present

## 2022-04-25 DIAGNOSIS — N1831 Chronic kidney disease, stage 3a: Secondary | ICD-10-CM | POA: Diagnosis not present

## 2022-04-25 DIAGNOSIS — I10 Essential (primary) hypertension: Secondary | ICD-10-CM | POA: Diagnosis not present

## 2022-04-25 DIAGNOSIS — I129 Hypertensive chronic kidney disease with stage 1 through stage 4 chronic kidney disease, or unspecified chronic kidney disease: Secondary | ICD-10-CM | POA: Diagnosis not present

## 2022-05-02 ENCOUNTER — Ambulatory Visit
Admission: RE | Admit: 2022-05-02 | Discharge: 2022-05-02 | Disposition: A | Discharge status: Home / Self Care | Payer: Medicare Other | Source: Ambulatory Visit | Attending: Internal Medicine | Admitting: Internal Medicine

## 2022-05-02 DIAGNOSIS — Z1231 Encounter for screening mammogram for malignant neoplasm of breast: Secondary | ICD-10-CM | POA: Diagnosis not present

## 2022-05-03 ENCOUNTER — Ambulatory Visit (INDEPENDENT_AMBULATORY_CARE_PROVIDER_SITE_OTHER): Payer: Medicare Other

## 2022-05-03 DIAGNOSIS — I10 Essential (primary) hypertension: Secondary | ICD-10-CM | POA: Diagnosis not present

## 2022-05-03 DIAGNOSIS — E538 Deficiency of other specified B group vitamins: Secondary | ICD-10-CM

## 2022-05-03 DIAGNOSIS — N1831 Chronic kidney disease, stage 3a: Secondary | ICD-10-CM | POA: Diagnosis not present

## 2022-05-03 DIAGNOSIS — I129 Hypertensive chronic kidney disease with stage 1 through stage 4 chronic kidney disease, or unspecified chronic kidney disease: Secondary | ICD-10-CM | POA: Diagnosis not present

## 2022-05-03 MED ORDER — CYANOCOBALAMIN 1000 MCG/ML IJ SOLN
1000.0000 ug | Freq: Once | INTRAMUSCULAR | Status: AC
  Administered 2022-05-03: 1000 ug via INTRAMUSCULAR

## 2022-05-03 NOTE — Progress Notes (Signed)
Patient arrived for her B12 injection. Patient was administered her B12 into her right deltoid. Patient tolerated the injection well and did not show any signs of distress or voice any concerns.

## 2022-05-14 ENCOUNTER — Ambulatory Visit (INDEPENDENT_AMBULATORY_CARE_PROVIDER_SITE_OTHER): Payer: Medicare Other | Admitting: Internal Medicine

## 2022-05-14 ENCOUNTER — Encounter: Payer: Self-pay | Admitting: Internal Medicine

## 2022-05-14 VITALS — BP 118/70 | HR 78 | Temp 98.1°F | Ht 66.0 in | Wt 206.2 lb

## 2022-05-14 DIAGNOSIS — J301 Allergic rhinitis due to pollen: Secondary | ICD-10-CM

## 2022-05-14 DIAGNOSIS — Z1211 Encounter for screening for malignant neoplasm of colon: Secondary | ICD-10-CM

## 2022-05-14 DIAGNOSIS — E782 Mixed hyperlipidemia: Secondary | ICD-10-CM

## 2022-05-14 DIAGNOSIS — R42 Dizziness and giddiness: Secondary | ICD-10-CM | POA: Diagnosis not present

## 2022-05-14 DIAGNOSIS — I700 Atherosclerosis of aorta: Secondary | ICD-10-CM | POA: Diagnosis not present

## 2022-05-14 DIAGNOSIS — R920 Mammographic microcalcification found on diagnostic imaging of breast: Secondary | ICD-10-CM

## 2022-05-14 DIAGNOSIS — N1831 Chronic kidney disease, stage 3a: Secondary | ICD-10-CM | POA: Diagnosis not present

## 2022-05-14 DIAGNOSIS — R7303 Prediabetes: Secondary | ICD-10-CM

## 2022-05-14 DIAGNOSIS — G4701 Insomnia due to medical condition: Secondary | ICD-10-CM | POA: Diagnosis not present

## 2022-05-14 DIAGNOSIS — I10 Essential (primary) hypertension: Secondary | ICD-10-CM

## 2022-05-14 LAB — LIPID PANEL
Cholesterol: 162 mg/dL (ref 0–200)
HDL: 56.8 mg/dL (ref >39.00)
LDL Cholesterol: 68 mg/dL (ref 0–99)
NonHDL: 105.06
Total CHOL/HDL Ratio: 3
Triglycerides: 186 mg/dL — ABNORMAL HIGH (ref 0.0–149.0)
VLDL: 37.2 mg/dL (ref 0.0–40.0)

## 2022-05-14 LAB — COMPREHENSIVE METABOLIC PANEL
ALT: 10 U/L (ref 0–35)
AST: 14 U/L (ref 0–37)
Albumin: 4.1 g/dL (ref 3.5–5.2)
Alkaline Phosphatase: 69 U/L (ref 39–117)
BUN: 20 mg/dL (ref 6–23)
CO2: 27 mEq/L (ref 19–32)
Calcium: 9.6 mg/dL (ref 8.4–10.5)
Chloride: 105 mEq/L (ref 96–112)
Creatinine, Ser: 1.11 mg/dL (ref 0.40–1.20)
GFR: 49.56 mL/min — ABNORMAL LOW (ref >60.00)
Glucose, Bld: 83 mg/dL (ref 70–99)
Potassium: 4.2 mEq/L (ref 3.5–5.1)
Sodium: 141 mEq/L (ref 135–145)
Total Bilirubin: 0.5 mg/dL (ref 0.2–1.2)
Total Protein: 6.7 g/dL (ref 6.0–8.3)

## 2022-05-14 LAB — LDL CHOLESTEROL, DIRECT: Direct LDL: 72 mg/dL

## 2022-05-14 LAB — HEMOGLOBIN A1C: Hgb A1c MFr Bld: 5.7 % (ref 4.6–6.5)

## 2022-05-14 MED ORDER — TELMISARTAN 80 MG PO TABS: 80.0000 mg | ORAL_TABLET | Freq: Every evening | ORAL | 1 refills | Status: DC

## 2022-05-14 MED ORDER — TRAZODONE HCL 50 MG PO TABS: 25.0000 mg | ORAL_TABLET | Freq: Every evening | ORAL | 3 refills | Status: DC | PRN

## 2022-05-14 NOTE — Assessment & Plan Note (Signed)
Multifactorial: due to ears of working 3rd shift, aggravated by OA knees/hips.  Advised to try adding melatonin at dinnertime (5 mg)  1000 mg tylenol and 25 to 50 mg trazodone 30 minutes prior to bedtime

## 2022-05-14 NOTE — Assessment & Plan Note (Signed)
Reviewed nephrology evaluation.  GFR is stable.  Reviewed BP readings.  Goal of 120/70 as long as her dizziness is caused by orthostasis . Reviewed list of otc nephrotoxic agents.

## 2022-05-14 NOTE — Assessment & Plan Note (Signed)
She denies true vertigo.  Not taking meclizine due to sedating side effects .  Working on core strength . Has not followed up with Dr Jillian [name] yet. Feels that symptoms are improving and not limiting her activities.

## 2022-05-14 NOTE — Assessment & Plan Note (Addendum)
Reviewed Goal of >  130/80 due to CKD,  which she confirms has been achieved with home readings.  Continue telmisartan 80 mg and amlodipine  2.5 mg daily

## 2022-05-14 NOTE — Assessment & Plan Note (Signed)
Aortic atherosclerosis:   Patient is tolerating high potency statin therapy  for risk reduction and  stabilization of atherosclerotic placque noted on prior abdominal CT which was reviewed during today's visit  Continue statin.  LDL is 71

## 2022-05-14 NOTE — Assessment & Plan Note (Signed)
Resume annual screening in February 2024 

## 2022-05-14 NOTE — Patient Instructions (Addendum)
Your blood pressure was elevated here today , but based on your home readings, no changes are needed.  Continue monitoring your BP at home.  Goal BP for patients with kidney disease is 130/80 or less to prevent deterioration of kidney function   Trial of sleep aides  (we will treat pain as well):  Melatonin 5 mg at dinnertime.    1000 mg acetaminophen (tylenol)  PLUS 25 mg trazodone 30 minutes before bed. This is SAFE FOR KIDNEYS,  but ALEVE AND MOTRIN ARE NOT SAFE  For your allergies:  You can use zyrtec  every 12 hours OR ADD FLONASE

## 2022-05-14 NOTE — Assessment & Plan Note (Signed)
Reviewed safe medications to use

## 2022-05-14 NOTE — Progress Notes (Signed)
Subjective:  Patient ID: Jillian [name], female    DOB: [birth year]  Age: 72 y.o. MRN: [id number]  CC: The primary encounter diagnosis was Colon cancer screening. Diagnoses of Primary hypertension, Mixed hyperlipidemia, Prediabetes, Dizziness, Stage 3a chronic kidney disease ([place]), Atherosclerosis of aorta ([facility]), Seasonal allergic rhinitis due to pollen, Abnormal mammogram with microcalcification, and Insomnia due to medical condition were also pertinent to this visit.   HPI Jillian [name] presents for  Chief Complaint  Patient presents with   Medical Management of Chronic Issues    6 month follow up    1) HTN: home readings  and nephrology readings have been well under 130/80  2) Chronic insomnia:  falls asleep most nights ok, but has frequent wakings,  urinary frequency. Some arthritis causing leg pain.     has avoided use of tylenol due to misunderstanding of effect on renal function.  No prior trial of trazodone   3) Recurrent vertigo:  sent to Neurology [facility] Nakayama, [facility]) by Dr Richardson Landry  after tests were negative .  November Note reviewed,  NO PE or MRI done.  Given meclizine and exercises. Has been working on core strength,  Standing  on one leg and doing water aerobics  3 times per week   4) allergic rhinitis:  using zyrtec .  Allergy to tree pollen by prior testing  = Outpatient Medications Prior to Visit  Medication Sig Dispense Refill   amLODipine (NORVASC) 2.5 MG tablet TAKE ONE TABLET BY MOUTH DAILY 90 tablet 3   aspirin 81 MG tablet Take 81 mg by mouth daily.     cetirizine (ZYRTEC) 10 MG tablet Take 10 mg by mouth at bedtime.     Cholecalciferol (VITAMIN D3) 1000 UNITS CAPS Take 1,000 Units by mouth daily.     estradiol (ESTRACE VAGINAL) 0.1 MG/GM vaginal cream Place 0.5 g vaginally 3 (three) times a week. 42.5 g 2   famotidine (PEPCID) 10 MG tablet Take 10 mg by mouth daily. As needed     Fluocinolone Acetonide 0.01 % OIL Apply 1 application topically See  admin instructions. Apply to affected ear daily as needed for dermatisis     fluticasone (FLONASE) 50 MCG/ACT nasal spray PLACE 2 SPRAYS INTO THE NOSE DAILY. 16 g 4   meclizine (ANTIVERT) 12.5 MG tablet Take 12.5 mg by mouth 3 (three) times daily as needed for dizziness. Take 1 tablet by mouth 2x's daily for one week then increase to take 2 tabletss by mouth 2 times daily     NON FORMULARY Apply 1 application topically daily as needed (for dermatisis on face). Hydrocortisone 2.5%/Ketoconazole 2% Cream     nystatin (MYCOSTATIN/NYSTOP) powder Apply topically 2 (two) times daily. To rash until resolved. 15 g 0   ondansetron (ZOFRAN) 4 MG tablet Take 1 tablet (4 mg total) by mouth every 8 (eight) hours as needed for nausea or vomiting. 20 tablet 0   Psyllium (METAMUCIL FIBER PO) Take by mouth. Take 1 tablet daily.     simvastatin (ZOCOR) 40 MG tablet TAKE ONE TABLET BY MOUTH EVERY NIGHT AT BEDTIME 90 tablet 3   telmisartan (MICARDIS) 80 MG tablet TAKE ONE TABLET BY MOUTH EVERY EVENING 90 tablet 1   predniSONE (DELTASONE) 10 MG tablet 6 tablets on Day 1 , then reduce by 1 tablet daily until gone (Patient not taking: Reported on 05/14/2022) 21 tablet 0   No facility-administered medications prior to visit.    Review of Systems;  Patient denies headache, fevers,  malaise, unintentional weight loss, skin rash, eye pain, sinus congestion and sinus pain, sore throat, dysphagia,  hemoptysis , cough, dyspnea, wheezing, chest pain, palpitations, orthopnea, edema, abdominal pain, nausea, melena, diarrhea, constipation, flank pain, dysuria, hematuria, urinary  Frequency, nocturia, numbness, tingling, seizures,  Focal weakness, Loss of consciousness,  Tremor, insomnia, depression, anxiety, and suicidal ideation.      Objective:  BP 118/70   Pulse 78   Temp 98.1 F (36.7 C) (Oral)   Ht 5\' 6"  (1.676 m)   Wt 206 lb 3.2 oz (93.5 kg)   SpO2 96%   BMI 33.28 kg/m   BP Readings from Last 3 Encounters:   05/14/22 118/70  04/05/22 138/88  01/22/22 120/86    Wt Readings from Last 3 Encounters:  05/14/22 206 lb 3.2 oz (93.5 kg)  04/05/22 208 lb (94.3 kg)  03/02/22 203 lb (92.1 kg)    Physical Exam  Lab Results  Component Value Date   HGBA1C 5.7 05/14/2022   HGBA1C 5.6 04/21/2021   HGBA1C 5.7 10/19/2020    Lab Results  Component Value Date   CREATININE 1.11 05/14/2022   CREATININE 1.32 (H) 10/18/2021   CREATININE 1.10 04/21/2021    Lab Results  Component Value Date   WBC 8.1 04/21/2021   HGB 12.9 04/21/2021   HCT 38.4 04/21/2021   PLT 219.0 04/21/2021   GLUCOSE 83 05/14/2022   CHOL 162 05/14/2022   TRIG 186.0 (H) 05/14/2022   HDL 56.80 05/14/2022   LDLDIRECT 72.0 05/14/2022   LDLCALC 68 05/14/2022   ALT 10 05/14/2022   AST 14 05/14/2022   NA 141 05/14/2022   K 4.2 05/14/2022   CL 105 05/14/2022   CREATININE 1.11 05/14/2022   BUN 20 05/14/2022   CO2 27 05/14/2022   TSH 2.21 04/21/2021   HGBA1C 5.7 05/14/2022   MICROALBUR <0.7 04/21/2021    MM 3D SCREEN BREAST BILATERAL  Result Date: 05/04/2022 CLINICAL DATA:  Screening. EXAM: DIGITAL SCREENING BILATERAL MAMMOGRAM WITH TOMOSYNTHESIS AND CAD TECHNIQUE: Bilateral screening digital craniocaudal and mediolateral oblique mammograms were obtained. Bilateral screening digital breast tomosynthesis was performed. The images were evaluated with computer-aided detection. COMPARISON:  Previous exam(s). ACR Breast Density Category b: There are scattered areas of fibroglandular density. FINDINGS: There are no findings suspicious for malignancy. IMPRESSION: No mammographic evidence of malignancy. A result letter of this screening mammogram will be mailed directly to the patient. RECOMMENDATION: Screening mammogram in one year. (Code:SM-B-01Y) BI-RADS CATEGORY  1: Negative. Electronically Signed   By: Abelardo Diesel M.D.   On: 05/04/2022 11:10    Assessment & Plan:  .Colon cancer screening -     Cologuard  Primary  hypertension Assessment & Plan: Reviewed Goal of >  130/80 due to CKD,  which she confirms has been achieved with home readings.  Continue telmisartan 80 mg and amlodipine  2.5 mg daily   Orders: -     Comprehensive metabolic panel  Mixed hyperlipidemia -     Lipid panel -     LDL cholesterol, direct  Prediabetes -     Hemoglobin A1c -     Comprehensive metabolic panel  Dizziness Assessment & Plan: She denies true vertigo.  Not taking meclizine due to sedating side effects .  Working on core strength . Has not followed up with Dr Melrose Nakayama yet. Feels that symptoms are improving and not limiting her activities.   Stage 3a chronic kidney disease [facility]) Assessment & Plan: Reviewed nephrology evaluation.  GFR is stable.  Reviewed  BP readings.  Goal of 120/70 as long as her dizziness is caused by orthostasis . Reviewed list of otc nephrotoxic agents.     Atherosclerosis of aorta [facility]) Assessment & Plan: Aortic atherosclerosis:   Patient is tolerating high potency statin therapy  for risk reduction and  stabilization of atherosclerotic placque noted on prior abdominal CT which was reviewed during today's visit  Continue statin.  LDL is 71    Seasonal allergic rhinitis due to pollen Assessment & Plan: Reviewed safe medications to use    Abnormal mammogram with microcalcification Assessment & Plan: Resume annual screening in February 2024   Insomnia due to medical condition Assessment & Plan: Multifactorial: due to ears of working 3rd shift, aggravated by OA knees/hips.  Advised to try adding melatonin at dinnertime (5 mg)  1000 mg tylenol and 25 to 50 mg trazodone 30 minutes prior to bedtime    Other orders -     Telmisartan; Take 1 tablet (80 mg total) by mouth every evening.  Dispense: 90 tablet; Refill: 1 -     traZODone HCl; Take 0.5-1 tablets (25-50 mg total) by mouth at bedtime as needed for sleep.  Dispense: 90 tablet; Refill: 3     I provided 30 minutes of  face-to-face time during this encounter reviewing patient's last visit with ENT,   nephrology,  and neurology,  recent surgical and non surgical procedures, previous  labs and imaging studies, counseling on currently addressed issues,  and post visit ordering to diagnostics and therapeutics .   Follow-up: Return in about 6 months (around 11/14/2022) for physical.   Crecencio Mc, MD

## 2022-06-04 ENCOUNTER — Ambulatory Visit (INDEPENDENT_AMBULATORY_CARE_PROVIDER_SITE_OTHER): Payer: Medicare Other

## 2022-06-04 DIAGNOSIS — E538 Deficiency of other specified B group vitamins: Secondary | ICD-10-CM | POA: Diagnosis not present

## 2022-06-04 MED ORDER — CYANOCOBALAMIN 1000 MCG/ML IJ SOLN
1000.0000 ug | Freq: Once | INTRAMUSCULAR | Status: AC
  Administered 2022-06-04: 1000 ug via INTRAMUSCULAR

## 2022-06-04 NOTE — Progress Notes (Signed)
Pt presented for their vitamin B12 injection. Pt was identified through two identifiers. Pt tolerated shot well in their left  deltoid.  

## 2022-06-12 DIAGNOSIS — R42 Dizziness and giddiness: Secondary | ICD-10-CM | POA: Diagnosis not present

## 2022-06-12 DIAGNOSIS — R278 Other lack of coordination: Secondary | ICD-10-CM | POA: Diagnosis not present

## 2022-06-12 DIAGNOSIS — R200 Anesthesia of skin: Secondary | ICD-10-CM | POA: Diagnosis not present

## 2022-06-12 DIAGNOSIS — R202 Paresthesia of skin: Secondary | ICD-10-CM | POA: Diagnosis not present

## 2022-06-15 DIAGNOSIS — H43813 Vitreous degeneration, bilateral: Secondary | ICD-10-CM | POA: Diagnosis not present

## 2022-06-15 DIAGNOSIS — Z961 Presence of intraocular lens: Secondary | ICD-10-CM | POA: Diagnosis not present

## 2022-06-15 DIAGNOSIS — D3131 Benign neoplasm of right choroid: Secondary | ICD-10-CM | POA: Diagnosis not present

## 2022-07-05 ENCOUNTER — Ambulatory Visit (INDEPENDENT_AMBULATORY_CARE_PROVIDER_SITE_OTHER): Payer: Medicare Other

## 2022-07-05 DIAGNOSIS — E538 Deficiency of other specified B group vitamins: Secondary | ICD-10-CM | POA: Diagnosis not present

## 2022-07-05 MED ORDER — CYANOCOBALAMIN 1000 MCG/ML IJ SOLN
1000.0000 ug | Freq: Once | INTRAMUSCULAR | Status: AC
  Administered 2022-07-05: 1000 ug via INTRAMUSCULAR

## 2022-07-05 NOTE — Progress Notes (Signed)
Patient presented for B 12 injection to right deltoid, patient voiced no concerns nor showed any signs of distress during injection. 

## 2022-07-16 DIAGNOSIS — Z1211 Encounter for screening for malignant neoplasm of colon: Secondary | ICD-10-CM | POA: Diagnosis not present

## 2022-07-19 ENCOUNTER — Ambulatory Visit (INDEPENDENT_AMBULATORY_CARE_PROVIDER_SITE_OTHER): Payer: Medicare Other | Admitting: Nurse Practitioner

## 2022-07-19 ENCOUNTER — Encounter: Payer: Self-pay | Admitting: Nurse Practitioner

## 2022-07-19 VITALS — BP 130/76 | HR 64 | Temp 98.6°F | Ht 66.0 in | Wt 200.6 lb

## 2022-07-19 DIAGNOSIS — R109 Unspecified abdominal pain: Secondary | ICD-10-CM

## 2022-07-19 LAB — POC COVID19 BINAXNOW: SARS Coronavirus 2 Ag: NEGATIVE

## 2022-07-19 MED ORDER — ONDANSETRON HCL 4 MG PO TABS: 4.0000 mg | ORAL_TABLET | Freq: Three times a day (TID) | ORAL | 0 refills | Status: DC | PRN

## 2022-07-19 NOTE — Progress Notes (Signed)
Established Patient Office Visit  Subjective:  Patient ID: Jillian [name], female    DOB: [birth year]  Age: 72 y.o. MRN: [id number]  CC:  Chief Complaint  Patient presents with   Abdominal Pain    Past 2 weeks, bloating gas and  diarrhea    HPI  Jillian [name] presents for    Abdominal Pain The current episode started 1 to 4 weeks ago. The onset quality is gradual. The problem occurs intermittently. The problem has been unchanged. The pain is located in the generalized abdominal region. The abdominal pain does not radiate. Associated symptoms include diarrhea, nausea and vomiting. The pain is aggravated by eating. The pain is relieved by Nothing. Her past medical history is significant for irritable bowel syndrome.     Past Medical History:  Diagnosis Date   Allergy    seasonal rhinitis   Arthritis    right shoulder,  no intervention   Cancer (HCC)    basal cell    Chronic right maxillary sinusitis 08/13/2018   GERD (gastroesophageal reflux disease)    Hyperlipidemia    Hypertension    Irritable bowel syndrome (IBS) Oct 2011   last colonoscopy showed diverticulum, no polyps   Neuropathy    FEET   Thrombophlebitis of left lower extremity (HCC) 09/29/2019   Vertigo    OCCAS    Past Surgical History:  Procedure Laterality Date   BREAST BIOPSY Right 03/12/2019   [place] bx neg   CATARACT EXTRACTION W/PHACO Left 04/09/2017   Procedure: CATARACT EXTRACTION PHACO AND INTRAOCULAR LENS PLACEMENT (IOC);  Surgeon: Galen Manila, MD;  Location: [facility] ORS;  Service: Ophthalmology;  Laterality: Left;  [place] 00:29.6 AP% 18.4 CDE 5.44 FLUID PACK LOT # [id number]   CATARACT EXTRACTION W/PHACO Right 04/30/2017   Procedure: CATARACT EXTRACTION PHACO AND INTRAOCULAR LENS PLACEMENT (IOC);  Surgeon: Galen Manila, MD;  Location: [facility] ORS;  Service: Ophthalmology;  Laterality: Right;  [place] 00:43.1 AP% 13.7 CDE 5.91 Fluid Pack Lot # [id number]   right breast bx  03/12/2019    TONSILLECTOMY AND ADENOIDECTOMY  1956   TUBAL LIGATION  1977   VEIN SURGERY      Family History  Problem Relation Age of Onset   Hyperlipidemia Mother    Hyperlipidemia Father    Hypertension Father    Hyperlipidemia Maternal Grandmother    Hyperlipidemia Maternal Grandfather    Cancer Cousin    Breast cancer Sister 21    Social History   Socioeconomic History   Marital status: Divorced    Spouse name: Not on file   Number of children: Not on file   Years of education: Not on file   Highest education level: Not on file  Occupational History   Occupation: cutsodial    Employer: [employer]    Comment: works at [employer]  Tobacco Use   Smoking status: Former    Types: Cigarettes    Quit date: 03/16/1999    Years since quitting: 23.3   Smokeless tobacco: Never  Vaping Use   Vaping Use: Never used  Substance and Sexual Activity   Alcohol use: No    Alcohol/week: 0.0 standard drinks of alcohol   Drug use: No   Sexual activity: Not Currently  Other Topics Concern   Not on file  Social History Narrative   Not on file   Social Determinants of Health   Financial Resource Strain: Low Risk  (03/02/2022)   Overall Financial Resource Strain (CARDIA)    Difficulty of Paying Living  Expenses: Not hard at all  Food Insecurity: No Food Insecurity (03/02/2022)   Hunger Vital Sign    Worried About Running Out of Food in the Last Year: Never true    Ran Out of Food in the Last Year: Never true  Transportation Needs: No Transportation Needs (03/02/2022)   PRAPARE - Administrator, Civil Service (Medical): No    Lack of Transportation (Non-Medical): No  Physical Activity: Sufficiently Active (03/02/2022)   Exercise Vital Sign    Days of Exercise per Week: 5 days    Minutes of Exercise per Session: 30 min  Stress: Stress Concern Present (03/02/2022)   [employer] of Occupational Health - Occupational Stress Questionnaire    Feeling of Stress : To some extent  Social Connections:  Unknown (03/02/2022)   Social Connection and Isolation Panel [NHANES]    Frequency of Communication with Friends and Family: More than three times a week    Frequency of Social Gatherings with Friends and Family: More than three times a week    Attends Religious Services: Not on file    Active Member of Clubs or Organizations: Yes    Attends Banker Meetings: More than 4 times per year    Marital Status: Divorced  Intimate Partner Violence: Not At Risk (03/02/2022)   Humiliation, Afraid, Rape, and Kick questionnaire    Fear of Current or Ex-Partner: No    Emotionally Abused: No    Physically Abused: No    Sexually Abused: No     Outpatient Medications Prior to Visit  Medication Sig Dispense Refill   amLODipine (NORVASC) 2.5 MG tablet TAKE ONE TABLET BY MOUTH DAILY 90 tablet 3   aspirin 81 MG tablet Take 81 mg by mouth daily.     cetirizine (ZYRTEC) 10 MG tablet Take 10 mg by mouth at bedtime.     Cholecalciferol (VITAMIN D3) 1000 UNITS CAPS Take 1,000 Units by mouth daily.     estradiol (ESTRACE VAGINAL) 0.1 MG/GM vaginal cream Place 0.5 g vaginally 3 (three) times a week. 42.5 g 2   famotidine (PEPCID) 10 MG tablet Take 10 mg by mouth daily. As needed     Fluocinolone Acetonide 0.01 % OIL Apply 1 application topically See admin instructions. Apply to affected ear daily as needed for dermatisis     fluticasone (FLONASE) 50 MCG/ACT nasal spray PLACE 2 SPRAYS INTO THE NOSE DAILY. 16 g 4   meclizine (ANTIVERT) 12.5 MG tablet Take 12.5 mg by mouth 3 (three) times daily as needed for dizziness. Take 1 tablet by mouth 2x's daily for one week then increase to take 2 tabletss by mouth 2 times daily     NON FORMULARY Apply 1 application topically daily as needed (for dermatisis on face). Hydrocortisone 2.5%/Ketoconazole 2% Cream     nystatin (MYCOSTATIN/NYSTOP) powder Apply topically 2 (two) times daily. To rash until resolved. 15 g 0   Psyllium (METAMUCIL FIBER PO) Take by mouth. Take  1 tablet daily.     simvastatin (ZOCOR) 40 MG tablet TAKE ONE TABLET BY MOUTH EVERY NIGHT AT BEDTIME 90 tablet 3   telmisartan (MICARDIS) 80 MG tablet Take 1 tablet (80 mg total) by mouth every evening. 90 tablet 1   traZODone (DESYREL) 50 MG tablet Take 0.5-1 tablets (25-50 mg total) by mouth at bedtime as needed for sleep. 90 tablet 3   ondansetron (ZOFRAN) 4 MG tablet Take 1 tablet (4 mg total) by mouth every 8 (eight) hours as needed for  nausea or vomiting. 20 tablet 0   No facility-administered medications prior to visit.    Allergies  Allergen Reactions   Doxycycline Nausea And Vomiting   Gabapentin     Thoracic back pain aggravated by medication   two trials    Sulfa Drugs Cross Reactors Rash   Tetanus Toxoids Rash    ROS Review of Systems  Constitutional:  Negative for fatigue.  Gastrointestinal:  Positive for abdominal pain, diarrhea, nausea and vomiting.  Musculoskeletal: Negative.   Neurological:  Negative for dizziness and light-headedness.      Objective:    Physical Exam Constitutional:      Appearance: She is well-developed.  Cardiovascular:     Rate and Rhythm: Normal rate and regular rhythm.     Heart sounds: Normal heart sounds. No murmur heard. Pulmonary:     Effort: Pulmonary effort is normal.     Breath sounds: No stridor. No wheezing.  Abdominal:     General: Bowel sounds are normal.     Palpations: There is no mass.     Tenderness: There is generalized abdominal tenderness.  Neurological:     General: No focal deficit present.     Mental Status: She is alert. She is disoriented.  Psychiatric:        Mood and Affect: Mood normal.     BP 130/76   Pulse 64   Temp 98.6 F (37 C) (Oral)   Ht 5\' 6"  (1.676 m)   Wt 200 lb 9.6 oz (91 kg)   SpO2 97%   BMI 32.38 kg/m  Wt Readings from Last 3 Encounters:  07/19/22 200 lb 9.6 oz (91 kg)  05/14/22 206 lb 3.2 oz (93.5 kg)  04/05/22 208 lb (94.3 kg)     Health Maintenance  Topic Date Due    FOOT EXAM  Never done   DTaP/Tdap/Td (1 - Tdap) Never done   COVID-19 Vaccine (5 - 2023-24 season) 10/27/2021   Diabetic kidney evaluation - Urine ACR  04/21/2022   OPHTHALMOLOGY EXAM  05/28/2022   Fecal DNA (Cologuard)  09/04/2022   INFLUENZA VACCINE  09/27/2022   HEMOGLOBIN A1C  11/14/2022   Medicare Annual Wellness (AWV)  03/03/2023   MAMMOGRAM  05/02/2023   Diabetic kidney evaluation - eGFR measurement  07/19/2023   Pneumonia Vaccine 58+ Years old  Completed   DEXA SCAN  Completed   Hepatitis C Screening  Completed   Zoster Vaccines- Shingrix  Completed   HPV VACCINES  Aged Out   Colonoscopy  Discontinued    There are no preventive care reminders to display for this patient.  Lab Results  Component Value Date   TSH 2.21 04/21/2021   Lab Results  Component Value Date   WBC 10.4 07/19/2022   HGB 13.3 07/19/2022   HCT 40.7 07/19/2022   MCV 89.9 07/19/2022   PLT 264.0 07/19/2022   Lab Results  Component Value Date   NA 143 07/19/2022   K 4.6 07/19/2022   CO2 26 07/19/2022   GLUCOSE 88 07/19/2022   BUN 17 07/19/2022   CREATININE 1.41 (H) 07/19/2022   BILITOT 0.7 07/19/2022   ALKPHOS 75 07/19/2022   AST 15 07/19/2022   ALT 10 07/19/2022   PROT 7.1 07/19/2022   ALBUMIN 4.5 07/19/2022   CALCIUM 10.1 07/19/2022   GFR 37.15 (L) 07/19/2022   Lab Results  Component Value Date   CHOL 162 05/14/2022   Lab Results  Component Value Date   HDL 56.80 05/14/2022  Lab Results  Component Value Date   LDLCALC 68 05/14/2022   Lab Results  Component Value Date   TRIG 186.0 (H) 05/14/2022   Lab Results  Component Value Date   CHOLHDL 3 05/14/2022   Lab Results  Component Value Date   HGBA1C 5.7 05/14/2022      Assessment & Plan:  Abdominal pain, unspecified abdominal location Assessment & Plan: POCT COVID negative.  Will  do labs CBC, CMP, lipase and amylase. Advice to have a food diary.  Stay hydrated.   Orders: -     CBC -     Comprehensive  metabolic panel -     Amylase; Future -     Lipase; Future  Stomach pain -     POC COVID-19 BinaxNow  Other orders -     Ondansetron HCl; Take 1 tablet (4 mg total) by mouth every 8 (eight) hours as needed for nausea or vomiting.  Dispense: 20 tablet; Refill: 0    Follow-up: No follow-ups on file.   Kara Dies, NP

## 2022-07-19 NOTE — Patient Instructions (Signed)
Please go to the lab.  Diarrhea, Adult Diarrhea is when you pass loose and sometimes watery poop (stool) often. Diarrhea can make you feel weak and cause you to lose water in your body (get dehydrated). Losing water in your body can cause you to: Feel tired and thirsty. Have a dry mouth. Go pee (urinate) less often. Diarrhea often lasts 2-3 days. It can last longer if it is a sign of something more serious. Be sure to treat your diarrhea as told by your doctor. Follow these instructions at home: Eating and drinking     Follow these instructions as told by your doctor: Take an ORS (oral rehydration solution). This is a drink that helps you replace fluids and minerals your body lost. It is sold at pharmacies and stores. Drink enough fluid to keep your pee (urine) pale yellow. Drink fluids such as: Water. You can also get fluids by sucking on ice chips. Diluted fruit juice. Low-calorie sports drinks. Milk. Avoid drinking fluids that have a lot of sugar or caffeine in them. These include soda, energy drinks, and regular sports drinks. Avoid alcohol. Eat bland, easy-to-digest foods in small amounts as you are able. These foods include: Bananas. Applesauce. Rice. Low-fat (lean) meats. Toast. Crackers. Avoid spicy or fatty foods.  Medicines Take over-the-counter and prescription medicines only as told by your doctor. If you were prescribed antibiotics, take them as told by your doctor. Do not stop taking them even if you start to feel better. General instructions  Wash your hands often using soap and water for 20 seconds. If soap and water are not available, use hand sanitizer. Others in your home should wash their hands as well. Wash your hands: After using the toilet or changing a diaper. Before preparing, cooking, or serving food. While caring for a sick person. While visiting someone in a hospital. Rest at home while you get better. Take a warm bath to help with any burning or  pain from having diarrhea. Watch your condition for any changes. Contact a doctor if: You have a fever. Your diarrhea gets worse. You have new symptoms. You vomit every time you eat or drink. You feel light-headed, dizzy, or you have a headache. You have muscle cramps. You have signs of losing too much water in your body, such as: Dark pee, very little pee, or no pee. Cracked lips. Dry mouth. Sunken eyes. Sleepiness. Weakness. You have bloody or black poop or poop that looks like tar. You have very bad pain, cramping, or bloating in your belly (abdomen). Your skin feels cold and clammy. You feel confused. Get help right away if: You have chest pain. Your heart is beating very quickly. You have trouble breathing or you are breathing very quickly. You feel very weak or you faint. These symptoms may be an emergency. Get help right away. Call 911. Do not wait to see if the symptoms will go away. Do not drive yourself to the hospital. This information is not intended to replace advice given to you by your health care provider. Make sure you discuss any questions you have with your health care provider. Document Revised: 08/01/2021 Document Reviewed: 08/01/2021 Elsevier Patient Education  2024 [employer].

## 2022-07-20 ENCOUNTER — Ambulatory Visit (INDEPENDENT_AMBULATORY_CARE_PROVIDER_SITE_OTHER): Payer: Medicare Other

## 2022-07-20 DIAGNOSIS — R109 Unspecified abdominal pain: Secondary | ICD-10-CM | POA: Diagnosis not present

## 2022-07-20 LAB — COMPREHENSIVE METABOLIC PANEL
ALT: 10 U/L (ref 0–35)
AST: 15 U/L (ref 0–37)
Albumin: 4.5 g/dL (ref 3.5–5.2)
Alkaline Phosphatase: 75 U/L (ref 39–117)
BUN: 17 mg/dL (ref 6–23)
CO2: 26 mEq/L (ref 19–32)
Calcium: 10.1 mg/dL (ref 8.4–10.5)
Chloride: 104 mEq/L (ref 96–112)
Creatinine, Ser: 1.41 mg/dL — ABNORMAL HIGH (ref 0.40–1.20)
GFR: 37.15 mL/min — ABNORMAL LOW (ref >60.00)
Glucose, Bld: 88 mg/dL (ref 70–99)
Potassium: 4.6 mEq/L (ref 3.5–5.1)
Sodium: 143 mEq/L (ref 135–145)
Total Bilirubin: 0.7 mg/dL (ref 0.2–1.2)
Total Protein: 7.1 g/dL (ref 6.0–8.3)

## 2022-07-20 LAB — AMYLASE: Amylase: 29 U/L (ref 27–131)

## 2022-07-20 LAB — CBC
HCT: 40.7 % (ref 36.0–46.0)
Hemoglobin: 13.3 g/dL (ref 12.0–15.0)
MCHC: 32.7 g/dL (ref 30.0–36.0)
MCV: 89.9 fl (ref 78.0–100.0)
Platelets: 264 10*3/uL (ref 150.0–400.0)
RBC: 4.52 Mil/uL (ref 3.87–5.11)
RDW: 13.4 % (ref 11.5–15.5)
WBC: 10.4 10*3/uL (ref 4.0–10.5)

## 2022-07-20 LAB — LIPASE: Lipase: 23 U/L (ref 11.0–59.0)

## 2022-07-25 ENCOUNTER — Ambulatory Visit: Payer: Medicare Other | Admitting: Family

## 2022-07-26 ENCOUNTER — Encounter: Payer: Self-pay | Admitting: Nurse Practitioner

## 2022-07-26 DIAGNOSIS — R109 Unspecified abdominal pain: Secondary | ICD-10-CM | POA: Insufficient documentation

## 2022-07-26 NOTE — Telephone Encounter (Signed)
Pt is requesting lab results.

## 2022-07-26 NOTE — Assessment & Plan Note (Signed)
POCT COVID negative.  Will  do labs CBC, CMP, lipase and amylase. Advice to have a food diary.  Stay hydrated.

## 2022-07-27 LAB — COLOGUARD: COLOGUARD: NEGATIVE

## 2022-08-06 ENCOUNTER — Ambulatory Visit: Payer: Medicare Other

## 2022-08-09 ENCOUNTER — Ambulatory Visit (INDEPENDENT_AMBULATORY_CARE_PROVIDER_SITE_OTHER): Payer: Medicare Other

## 2022-08-09 DIAGNOSIS — E538 Deficiency of other specified B group vitamins: Secondary | ICD-10-CM

## 2022-08-09 MED ORDER — CYANOCOBALAMIN 1000 MCG/ML IJ SOLN
1000.0000 ug | Freq: Once | INTRAMUSCULAR | Status: AC
  Administered 2022-08-09: 1000 ug via INTRAMUSCULAR

## 2022-08-09 NOTE — Progress Notes (Signed)
Patient arrived for a B12 injection and it was administered into her left deltoid. Patient tolerated the injection well and did not show any signs of distress or voice any concerns. 

## 2022-09-04 ENCOUNTER — Emergency Department
Admission: EM | Admit: 2022-09-04 | Discharge: 2022-09-04 | Disposition: A | Discharge status: Home / Self Care | Payer: Medicare Other | Attending: Emergency Medicine | Admitting: Emergency Medicine

## 2022-09-04 ENCOUNTER — Emergency Department: Payer: Medicare Other

## 2022-09-04 ENCOUNTER — Encounter: Payer: Self-pay | Admitting: Internal Medicine

## 2022-09-04 ENCOUNTER — Other Ambulatory Visit: Payer: Self-pay

## 2022-09-04 DIAGNOSIS — K5900 Constipation, unspecified: Secondary | ICD-10-CM | POA: Insufficient documentation

## 2022-09-04 DIAGNOSIS — K5641 Fecal impaction: Secondary | ICD-10-CM | POA: Diagnosis not present

## 2022-09-04 DIAGNOSIS — I10 Essential (primary) hypertension: Secondary | ICD-10-CM | POA: Diagnosis not present

## 2022-09-04 DIAGNOSIS — Z8719 Personal history of other diseases of the digestive system: Secondary | ICD-10-CM | POA: Diagnosis not present

## 2022-09-04 DIAGNOSIS — K5730 Diverticulosis of large intestine without perforation or abscess without bleeding: Secondary | ICD-10-CM | POA: Diagnosis not present

## 2022-09-04 DIAGNOSIS — R140 Abdominal distension (gaseous): Secondary | ICD-10-CM | POA: Diagnosis not present

## 2022-09-04 MED ORDER — GLYCERIN (ADULT) 2 G RE SUPP: 1.0000 | RECTAL | 0 refills | Status: AC | PRN

## 2022-09-04 MED ORDER — POLYETHYLENE GLYCOL 3350 17 G PO PACK: 17.0000 g | PACK | Freq: Two times a day (BID) | ORAL | 0 refills | Status: AC

## 2022-09-04 NOTE — ED Triage Notes (Signed)
Pt here with constipation. Pt states her last bowel movement was Saturday. Pt has been taking stool softeners and laxatives with no relief. Pt ambulatory to triage.

## 2022-09-04 NOTE — ED Provider Notes (Signed)
[facility] Provider Note    Event Date/Time   First MD Initiated Contact with Patient 09/04/22 1700     (approximate)   History   Constipation   HPI  Jillian [name] is a 72 y.o. female   Past medical history of IBS and hyperlipidemia hypertension who presents emergency department with approximate 5 days constipation.  No bowel movement in 5 days.  Passing flatus.  No significant abdominal pain.  No nausea or vomiting.  No fever.  No dysuria.  External Medical Documents Reviewed: Office visit from primary doctor dated Jul 19, 2022 for abdominal pain, ordered Zofran      Physical Exam   Triage Vital Signs: ED Triage Vitals  Enc Vitals Group     BP 09/04/22 1544 (!) 140/68     Pulse Rate 09/04/22 1543 76     Resp 09/04/22 1543 16     Temp 09/04/22 1543 98.5 F (36.9 C)     Temp Source 09/04/22 1543 Oral     SpO2 09/04/22 1543 94 %     Weight 09/04/22 1543 200 lb 9.9 oz (91 kg)     Height 09/04/22 1543 5\' 6"  (1.676 m)     Head Circumference --      Peak Flow --      Pain Score 09/04/22 1543 3     Pain Loc --      Pain Edu? --      Excl. in GC? --     Most recent vital signs: Vitals:   09/04/22 1543 09/04/22 1544  BP:  (!) 140/68  Pulse: 76   Resp: 16   Temp: 98.5 F (36.9 C)   SpO2: 94%     General: Awake, no distress.  CV:  Good peripheral perfusion.  Resp:  Normal effort.  Abd:  No distention.  Other:  Nondistended nontender abdomen and this comfortable appearing patient with normal hemodynamics and afebrile.  Rectal exam shows hard stool in the vault.   ED Results / Procedures / Treatments   Labs (all labs ordered are listed, but only abnormal results are displayed) Labs Reviewed - No data to display   RADIOLOGY I independently reviewed and interpreted x-ray of the abdomen and see significant stool.   PROCEDURES:  Critical Care performed: No  Fecal disimpaction  Date/Time: 09/04/2022 7:20 PM  Performed by:  Pilar Jarvis, MD Authorized by: Pilar Jarvis, MD  Consent: Verbal consent obtained. Risks and benefits: risks, benefits and alternatives were discussed Consent given by: patient Patient understanding: patient states understanding of the procedure being performed Patient identity confirmed: verbally with patient Time out: Immediately prior to procedure a "time out" was called to verify the correct patient, procedure, equipment, support staff and site/side marked as required. Local anesthesia used: no  Anesthesia: Local anesthesia used: no  Sedation: Patient sedated: no  Patient tolerance: patient tolerated the procedure well with no immediate complications      MEDICATIONS ORDERED IN ED: Medications - No data to display   IMPRESSION / MDM / ASSESSMENT AND PLAN / ED COURSE  I reviewed the triage vital signs and the nursing notes.                                Patient's presentation is most consistent with acute presentation with potential threat to life or bodily function.  Differential diagnosis includes, but is not limited to, stercoral colitis, constipation, fecal  impaction, obstruction   The patient is on the cardiac monitor to evaluate for evidence of arrhythmia and/or significant heart rate changes.  MDM: Is a patient with a history of appendectomy and now constipated not passing stool for 5 days concern for obstruction fortunately CT scan shows no bowel obstruction but does show fecal impaction patient was disimpacted and tolerated well with removal of significant amount of hard stool.  Plan for discharge and bowel regimen with PMD follow-up.       FINAL CLINICAL IMPRESSION(S) / ED DIAGNOSES   Final diagnoses:  Constipation, unspecified constipation type  Fecal impaction in rectum (HCC)     Rx / DC Orders   ED Discharge Orders          Ordered    polyethylene glycol (MIRALAX) 17 g packet  2 times daily        09/04/22 1858    glycerin adult 2 g  suppository  As needed        09/04/22 1858             Note:  This document was prepared using Dragon voice recognition software and may include unintentional dictation errors.Pilar Jarvis, MD 09/04/22 [phone]

## 2022-09-04 NOTE — Discharge Instructions (Signed)
Take medications as prescribed.  Use MiraLAX twice daily.  Use glycerin suppository only as needed if you have severe constipation.  Thank you for choosing [place] for your health care today!  Please see your primary doctor this week for a follow up appointment.   If you have any new, worsening, or unexpected symptoms call your doctor right away or come back to the emergency department for reevaluation.  It was my pleasure to care for you today.   Daneil Dan Modesto Charon, MD

## 2022-09-05 NOTE — Telephone Encounter (Signed)
Per Char pt went to ER for this yesterday

## 2022-09-06 ENCOUNTER — Ambulatory Visit: Payer: Medicare Other | Admitting: Nurse Practitioner

## 2022-09-07 ENCOUNTER — Ambulatory Visit (INDEPENDENT_AMBULATORY_CARE_PROVIDER_SITE_OTHER): Payer: Medicare Other | Admitting: Family

## 2022-09-07 ENCOUNTER — Encounter: Payer: Self-pay | Admitting: Family

## 2022-09-07 VITALS — BP 136/82 | HR 62 | Temp 97.9°F | Ht 66.5 in | Wt 198.8 lb

## 2022-09-07 DIAGNOSIS — E538 Deficiency of other specified B group vitamins: Secondary | ICD-10-CM

## 2022-09-07 DIAGNOSIS — K5900 Constipation, unspecified: Secondary | ICD-10-CM

## 2022-09-07 MED ORDER — CYANOCOBALAMIN 1000 MCG/ML IJ SOLN
1000.0000 ug | Freq: Once | INTRAMUSCULAR | Status: AC
  Administered 2022-09-07: 1000 ug via INTRAMUSCULAR

## 2022-09-07 NOTE — Patient Instructions (Addendum)
I would decrease miralax to every 3rd day.   Keep food diary  Consider eliminating lactose completely for 2 weeks  Continue Metamucil  I placed a referral to [place]enterologyLet [place] know if you dont hear back within a week in regards to an appointment being scheduled.   So that you are aware, if you are [facility] MyChart user , please pay attention to your MyChart messages as you may receive a MyChart message with a phone number to call and schedule this test/appointment own your own from our referral coordinator. This is a new process so I do not want you to miss this message.  If you are not a MyChart user, you will receive a phone call.

## 2022-09-07 NOTE — Progress Notes (Signed)
Assessment & Plan:  Constipation, unspecified constipation type Assessment & Plan: Chronic.  Reviewed ED visit.  Patient has been taking MiraLAX twice daily resulting in loose to watery brown stools.  Advised her to decrease MiraLAX to l every 3 rd day, then titrate.  Advised to continue Metamucil.  Advised elimination of lactose for 2 weeks.  Referral placed to gastroenterology for discussion of constipation, and if needed for colonoscopy  Orders: -     Ambulatory referral to Gastroenterology  B12 deficiency Assessment & Plan: Patient is on lifelong B12 supplementation.  History of positive intrinsic factor.  B12 injection given      Return precautions given.   Risks, benefits, and alternatives of the medications and treatment plan prescribed today were discussed, and patient expressed understanding.   Education regarding symptom management and diagnosis given to patient on AVS either electronically or printed.  Return if symptoms worsen or fail to improve.  Rennie Plowman, FNP  Subjective:    Patient ID: Jillian [name], female    DOB: 18-Jan-1951, 72 y.o.   MRN: [id number]  CC: MAGGIEMAE [name] is a 72 y.o. female who presents today for follow up.   HPI: ED follow-up 09/04/2022 Since starting miralax bid she is having loose to liquid brown stool.   No recent antibiotics  No abdominal pain, fever, rash.   She is eating banana , yogurt   She notes she has been eating plant based diet this year. She very seldom eats read meat.   She reports h/o IBS- more diarrhea.   She takes pepcid ac 10mg  prn.     She is compliant with metamucil and eats yogurt daily.   She questions if lactose intolerant   Presented to emergency room after  5 days of constipation ED visit significant for fecal disimpaction CT abdomen and pelvis without bowel obstruction.  Small cyst of right kidney Rozek hyper 1 which are measurable.  No further imaging workup of these lesions  indicated.  No urinary tract calculi.  Sigmoid diverticulosis without diverticulitis.  No dilated small bowel.  Atherosclerosis present.  Mild left foraminal stenosis L4-L5 Provided MiraLAX BID, glycerin suppository to use as needed   history of appendectomy  She is compliant with Zocor 40 mg daily, aspirin 81 mg daily  Reported colonoscopy 13 years ago Cologuard neg 09/04/2019   She gets B12 injections monthly.  She has a visit 09/10/2022 for B12.  She request earlier today Allergies: Doxycycline, Gabapentin, Sulfa drugs cross reactors, and Tetanus toxoids Current Outpatient Medications on File Prior to Visit  Medication Sig Dispense Refill   amLODipine (NORVASC) 2.5 MG tablet TAKE ONE TABLET BY MOUTH DAILY 90 tablet 3   aspirin 81 MG tablet Take 81 mg by mouth daily.     cetirizine (ZYRTEC) 10 MG tablet Take 10 mg by mouth at bedtime.     Cholecalciferol (VITAMIN D3) 1000 UNITS CAPS Take 1,000 Units by mouth daily.     estradiol (ESTRACE VAGINAL) 0.1 MG/GM vaginal cream Place 0.5 g vaginally 3 (three) times a week. 42.5 g 2   famotidine (PEPCID) 10 MG tablet Take 10 mg by mouth daily. As needed     Fluocinolone Acetonide 0.01 % OIL Apply 1 application topically See admin instructions. Apply to affected ear daily as needed for dermatisis     fluticasone (FLONASE) 50 MCG/ACT nasal spray PLACE 2 SPRAYS INTO THE NOSE DAILY. 16 g 4   glycerin adult 2 g suppository Place 1 suppository rectally as needed  for constipation. 12 suppository 0   NON FORMULARY Apply 1 application topically daily as needed (for dermatisis on face). Hydrocortisone 2.5%/Ketoconazole 2% Cream     nystatin (MYCOSTATIN/NYSTOP) powder Apply topically 2 (two) times daily. To rash until resolved. 15 g 0   ondansetron (ZOFRAN) 4 MG tablet Take 1 tablet (4 mg total) by mouth every 8 (eight) hours as needed for nausea or vomiting. 20 tablet 0   polyethylene glycol (MIRALAX) 17 g packet Take 17 g by mouth 2 (two) times daily. 60  packet 0   Psyllium (METAMUCIL FIBER PO) Take by mouth. Take 1 tablet daily.     simvastatin (ZOCOR) 40 MG tablet TAKE ONE TABLET BY MOUTH EVERY NIGHT AT BEDTIME 90 tablet 3   telmisartan (MICARDIS) 80 MG tablet Take 1 tablet (80 mg total) by mouth every evening. 90 tablet 1   traZODone (DESYREL) 50 MG tablet Take 0.5-1 tablets (25-50 mg total) by mouth at bedtime as needed for sleep. 90 tablet 3   No current facility-administered medications on file prior to visit.    Review of Systems  Constitutional:  Negative for chills and fever.  Respiratory:  Negative for cough.   Cardiovascular:  Negative for chest pain and palpitations.  Gastrointestinal:  Positive for constipation and diarrhea. Negative for abdominal distention, blood in stool, nausea and vomiting.      Objective:    BP 136/82   Pulse 62   Temp 97.9 F (36.6 C) (Oral)   Ht 5' 6.5" (1.689 m)   Wt 198 lb 12.8 oz (90.2 kg)   SpO2 97%   BMI 31.61 kg/m  BP Readings from Last 3 Encounters:  09/07/22 136/82  09/04/22 (!) 140/68  07/19/22 130/76   Wt Readings from Last 3 Encounters:  09/07/22 198 lb 12.8 oz (90.2 kg)  09/04/22 200 lb 9.9 oz (91 kg)  07/19/22 200 lb 9.6 oz (91 kg)    Physical Exam Vitals reviewed.  Constitutional:      Appearance: Normal appearance. She is well-developed.  Eyes:     Conjunctiva/sclera: Conjunctivae normal.  Cardiovascular:     Rate and Rhythm: Normal rate and regular rhythm.     Pulses: Normal pulses.     Heart sounds: Normal heart sounds.  Pulmonary:     Effort: Pulmonary effort is normal.     Breath sounds: Normal breath sounds. No wheezing, rhonchi or rales.  Abdominal:     General: Bowel sounds are normal. There is no distension.     Palpations: Abdomen is soft. Abdomen is not rigid. There is no fluid wave or mass.     Tenderness: There is no abdominal tenderness. There is no guarding or rebound.  Skin:    General: Skin is warm and dry.  Neurological:     Mental Status:  She is alert.  Psychiatric:        Speech: Speech normal.        Behavior: Behavior normal.        Thought Content: Thought content normal.

## 2022-09-07 NOTE — Addendum Note (Signed)
Addended by: [place], Mitcheal Sweetin on: 09/07/2022 02:21 PM   Modules accepted: Orders

## 2022-09-07 NOTE — Assessment & Plan Note (Signed)
Chronic.  Reviewed ED visit.  Patient has been taking MiraLAX twice daily resulting in loose to watery brown stools.  Advised her to decrease MiraLAX to l every 3 rd day, then titrate.  Advised to continue Metamucil.  Advised elimination of lactose for 2 weeks.  Referral placed to gastroenterology for discussion of constipation, and if needed for colonoscopy

## 2022-09-07 NOTE — Progress Notes (Signed)
Patient presented for B 12 injection to right deltoid, patient voiced no concerns nor showed any signs of distress during injection. 

## 2022-09-07 NOTE — Assessment & Plan Note (Signed)
Patient is on lifelong B12 supplementation.  History of positive intrinsic factor.  B12 injection given

## 2022-09-10 ENCOUNTER — Ambulatory Visit: Payer: Medicare Other

## 2022-09-11 ENCOUNTER — Telehealth: Payer: Self-pay

## 2022-09-11 NOTE — Telephone Encounter (Signed)
Transition Care Management Unsuccessful Follow-up Telephone Call  Date of discharge and from where:  [facility] 7/9  Attempts:  1st Attempt  Reason for unsuccessful TCM follow-up call:  Left voice message   Lenard Forth [facility] Guide, [facility] [phone] [address] [place], [place], [place] [address] Phone: [phone] Email: [email or web address]

## 2022-09-11 NOTE — Telephone Encounter (Signed)
Transition Care Management Unsuccessful Follow-up Telephone Call  Date of discharge and from where:  [facility] 7/9  Attempts:  2nd Attempt  Reason for unsuccessful TCM follow-up call:  No answer/busy   Lenard Forth [facility] Guide, [facility] [phone] [address] [place], [place], [place] [address] Phone: [phone] Email: [email or web address]

## 2022-09-27 ENCOUNTER — Encounter: Payer: Self-pay | Admitting: Gastroenterology

## 2022-09-27 ENCOUNTER — Encounter: Payer: Self-pay | Admitting: Family

## 2022-09-27 ENCOUNTER — Ambulatory Visit: Payer: Medicare Other | Admitting: Gastroenterology

## 2022-09-27 VITALS — BP 135/84 | HR 69 | Temp 97.9°F | Ht 66.5 in | Wt 199.0 lb

## 2022-09-27 DIAGNOSIS — K580 Irritable bowel syndrome with diarrhea: Secondary | ICD-10-CM

## 2022-09-27 DIAGNOSIS — K5903 Drug induced constipation: Secondary | ICD-10-CM | POA: Diagnosis not present

## 2022-09-27 DIAGNOSIS — Z1211 Encounter for screening for malignant neoplasm of colon: Secondary | ICD-10-CM | POA: Diagnosis not present

## 2022-09-27 DIAGNOSIS — Z91018 Allergy to other foods: Secondary | ICD-10-CM

## 2022-09-27 MED ORDER — PEG 3350-KCL-NABCB-NACL-NASULF 236 G PO SOLR: 4000.0000 mL | Freq: Once | ORAL | 0 refills | Status: AC

## 2022-09-27 NOTE — Progress Notes (Signed)
Gastroenterology Consultation  Referring Provider:     Sherlene Shams, MD Primary Care Physician:  Sherlene Shams, MD Primary Gastroenterologist:  Jillian [name]     Reason for Consultation:     Constipation        HPI:   Jillian [name] is a 72 y.o. y/o female referred for consultation & management of constipation by Jillian [name], Jillian Daring, MD.  This patient comes in with a history of irritable bowel syndrome with her last colonoscopy reported to be in October 2011 and at that time showed no polyps as reported by her PCP.  The patient has had 2 Cologuard test 3 years apart that were both negative.  The patient was in the emergency department in July with constipation.  The patient was reporting that she had not had a bowel movement in 3 days.  At the time she was reported to pass gas.  The patient then followed up with her PCP who reported " Patient has been taking MiraLAX twice daily resulting in loose to watery brown stools.  Advised her to decrease MiraLAX to l every 3 rd day, then titrate.  Advised to continue Metamucil.  Advised elimination of lactose for 2 weeks.  Referral placed to gastroenterology for discussion of constipation, and if needed for colonoscopy"  The patient reports that her constipation was likely caused by the medication she was taking because when she stopped taking that medication her constipation went away.  The patient has a history of irritable bowel syndrome with Bristol stool scale showing between 4 and 5.  There is no report of any unexplained weight loss fevers chills nausea or vomiting.  The patient also reports that since that episode of constipation she has not had any further episodes of constipation.  Past Medical History:  Diagnosis Date   Allergy    seasonal rhinitis   Arthritis    right shoulder,  no intervention   Cancer (HCC)    basal cell    Chronic right maxillary sinusitis 08/13/2018   GERD (gastroesophageal reflux disease)    Hyperlipidemia     Hypertension    Irritable bowel syndrome (IBS) Oct 2011   last colonoscopy showed diverticulum, no polyps   Neuropathy    FEET   Thrombophlebitis of left lower extremity (HCC) 09/29/2019   Vertigo    OCCAS    Past Surgical History:  Procedure Laterality Date   BREAST BIOPSY Right 03/12/2019   [place] bx neg   CATARACT EXTRACTION W/PHACO Left 04/09/2017   Procedure: CATARACT EXTRACTION PHACO AND INTRAOCULAR LENS PLACEMENT (IOC);  Surgeon: Jillian Manila, MD;  Location: [facility] ORS;  Service: Ophthalmology;  Laterality: Left;  [place] 00:29.6 AP% 18.4 CDE 5.44 FLUID PACK LOT # [id number]   CATARACT EXTRACTION W/PHACO Right 04/30/2017   Procedure: CATARACT EXTRACTION PHACO AND INTRAOCULAR LENS PLACEMENT (IOC);  Surgeon: Jillian Manila, MD;  Location: [facility] ORS;  Service: Ophthalmology;  Laterality: Right;  [place] 00:43.1 AP% 13.7 CDE 5.91 Fluid Pack Lot # [id number]   right breast bx  03/12/2019   TONSILLECTOMY AND ADENOIDECTOMY  1956   TUBAL LIGATION  1977   VEIN SURGERY      Prior to Admission medications   Medication Sig Start Date End Date Taking? Authorizing Provider  amLODipine (NORVASC) 2.5 MG tablet TAKE ONE TABLET BY MOUTH DAILY 03/22/22   Sherlene Shams, MD  aspirin 81 MG tablet Take 81 mg by mouth daily.    [provider]  cetirizine (ZYRTEC) 10  MG tablet Take 10 mg by mouth at bedtime.    [provider]  Cholecalciferol (VITAMIN D3) 1000 UNITS CAPS Take 1,000 Units by mouth daily.    [provider]  estradiol (ESTRACE VAGINAL) 0.1 MG/GM vaginal cream Place 0.5 g vaginally 3 (three) times a week. 12/21/20   Hildred Laser, MD  famotidine (PEPCID) 10 MG tablet Take 10 mg by mouth daily. As needed    [provider]  Fluocinolone Acetonide 0.01 % OIL Apply 1 application topically See admin instructions. Apply to affected ear daily as needed for dermatisis 01/20/17   [provider]  fluticasone (FLONASE) 50 MCG/ACT nasal spray PLACE 2 SPRAYS INTO  THE NOSE DAILY. 09/20/16   Sherlene Shams, MD  glycerin adult 2 g suppository Place 1 suppository rectally as needed for constipation. 09/04/22   Pilar Jarvis, MD  NON FORMULARY Apply 1 application topically daily as needed (for dermatisis on face). Hydrocortisone 2.5%/Ketoconazole 2% Cream    [provider]  nystatin (MYCOSTATIN/NYSTOP) powder Apply topically 2 (two) times daily. To rash until resolved. 08/12/18   Sherlene Shams, MD  ondansetron (ZOFRAN) 4 MG tablet Take 1 tablet (4 mg total) by mouth every 8 (eight) hours as needed for nausea or vomiting. 07/19/22   Kara Dies, NP  polyethylene glycol (MIRALAX) 17 g packet Take 17 g by mouth 2 (two) times daily. 09/04/22 10/04/22  Pilar Jarvis, MD  Psyllium (METAMUCIL FIBER PO) Take by mouth. Take 1 tablet daily.    [provider]  simvastatin (ZOCOR) 40 MG tablet TAKE ONE TABLET BY MOUTH EVERY NIGHT AT BEDTIME 03/22/22   Sherlene Shams, MD  telmisartan (MICARDIS) 80 MG tablet Take 1 tablet (80 mg total) by mouth every evening. 05/14/22   Sherlene Shams, MD  traZODone (DESYREL) 50 MG tablet Take 0.5-1 tablets (25-50 mg total) by mouth at bedtime as needed for sleep. 05/14/22   Sherlene Shams, MD    Family History  Problem Relation Age of Onset   Hyperlipidemia Mother    Hyperlipidemia Father    Hypertension Father    Breast cancer Sister 22   Cancer Sister        'blood cancer', dx 20   Hyperlipidemia Maternal Grandmother    Hyperlipidemia Maternal Grandfather    Cancer Cousin      Social History   Tobacco Use   Smoking status: Former    Current packs/day: 0.00    Types: Cigarettes    Quit date: 03/16/1999    Years since quitting: 23.5   Smokeless tobacco: Never  Vaping Use   Vaping status: Never Used  Substance Use Topics   Alcohol use: No    Alcohol/week: 0.0 standard drinks of alcohol   Drug use: No    Allergies as of 09/27/2022 - Review Complete 09/07/2022  Allergen Reaction Noted   Doxycycline  Nausea And Vomiting 11/09/2015   Gabapentin  08/03/2021   Sulfa drugs cross reactors Rash 12/14/2010   Tetanus toxoids Rash 12/14/2010    Review of Systems:    All systems reviewed and negative except where noted in HPI.   Physical Exam:  There were no vitals taken for this visit. No LMP recorded. Patient is postmenopausal. General:   Alert,  Well-developed, well-nourished, pleasant and cooperative in NAD Head:  Normocephalic and atraumatic. Eyes:  Sclera clear, no icterus.   Conjunctiva pink. Ears:  Normal auditory acuity. Neck:  Supple; no masses or thyromegaly. Lungs:  Respirations even and unlabored.  Clear throughout to auscultation.   No wheezes, crackles, or rhonchi. No acute distress. Heart:  Regular rate and rhythm; no murmurs, clicks, rubs, or gallops. Abdomen:  Normal bowel sounds.  No bruits.  Soft, non-tender and non-distended without masses, hepatosplenomegaly or hernias noted.  No guarding or rebound tenderness.  Negative Carnett sign.   Rectal:  Deferred.  Pulses:  Normal pulses noted. Extremities:  No clubbing or edema.  No cyanosis. Neurologic:  Alert and oriented x3;  grossly normal neurologically. Skin:  Intact without significant lesions or rashes.  No jaundice. Lymph Nodes:  No significant cervical adenopathy. Psych:  Alert and cooperative. Normal mood and affect.  Imaging Studies: CT ABDOMEN PELVIS WO CONTRAST  Result Date: 09/04/2022 CLINICAL DATA:  Patient reports last bowel movement was Saturday, no relief with laxatives/stool softeners. Possible bowel obstruction. EXAM: CT ABDOMEN AND PELVIS WITHOUT CONTRAST TECHNIQUE: Multidetector CT imaging of the abdomen and pelvis was performed following the standard protocol without IV contrast. RADIATION DOSE REDUCTION: This exam was performed according to the departmental dose-optimization program which includes automated exposure control, adjustment of the mA and/or kV according to patient size and/or use of iterative  reconstruction technique. COMPARISON:  Radiograph 09/04/2022 and CT scan of 06/11/2016 FINDINGS: Lower chest: Descending thoracic aortic atherosclerotic vascular calcification. Hepatobiliary: Unremarkable Pancreas: Unremarkable Spleen: Unremarkable Adrenals/Urinary Tract: Small cysts of the right kidney are Bosniak category 1 where measurable. No further imaging workup of these lesions is indicated. Adrenal glands unremarkable. No urinary tract calculi, hydronephrosis, or hydroureter. Urinary bladder unremarkable. Stomach/Bowel: Sigmoid colon diverticulosis without active diverticulitis. There are a few additional scattered colonic diverticula. No dilated small bowel. Prominence of rectal stool measuring 5.4 by 10.2 by 5.7 cm, cannot exclude fecal impaction. No wall thickening in the rectum to suggest stercoral colitis. Vascular/Lymphatic: Atherosclerosis is present, including aortoiliac atherosclerotic disease. Reproductive: Unremarkable Other: No supplemental non-categorized findings. Musculoskeletal: Mild left foraminal stenosis at the L4-5 level due to facet spurring. IMPRESSION: 1. Prominence of rectal stool, cannot exclude fecal impaction. No wall thickening in the rectum to suggest stercoral colitis. 2. Sigmoid colon diverticulosis without active diverticulitis. 3. Mild left foraminal stenosis at L4-5 due to facet spurring. 4. Aortic atherosclerosis. Aortic Atherosclerosis (ICD10-I70.0). Electronically Signed   By: Gaylyn Rong M.D.   On: 09/04/2022 17:47   DG Abdomen 1 View  Result Date: 09/04/2022 CLINICAL DATA:  Day history of constipation EXAM: ABDOMEN - 1 VIEW COMPARISON:  CT abdomen and pelvis dated 06/11/2016 FINDINGS: Relative paucity of bowel gas within the abdomen. No free air or pneumatosis. Large volume stool in the rectum. No abnormal radio-opaque calculi or mass effect. No acute or substantial osseous abnormality. The sacrum and coccyx are partially obscured by overlying bowel contents.  IMPRESSION: Large volume stool in the rectum. Electronically Signed   By: Agustin Cree M.D.   On: 09/04/2022 16:26    Assessment and Plan:   MINERVIA BRILLA is a 72 y.o. y/o female who comes in today with a referral for constipation.  The patient states that it was related to the medication she had been taking and she is no longer taking that medication and has no longer had any further constipation.  The patient is due for screening colon test since her last Cologuard was 3 years ago.  The patient reports that she would like to switch over to colonoscopies and only did the Cologuard because it was during COVID.  The patient will be set up for screening colonoscopy at the next avail appointment.  The patient has been explained the plan and agrees with it.    Midge Minium, MD. Clementeen Graham    Note: This dictation was prepared with Dragon dictation along with smaller phrase technology. Any transcriptional errors that result from this process are unintentional.

## 2022-09-28 NOTE — Addendum Note (Signed)
Addended by: Roena Malady on: 09/28/2022 12:55 PM   Modules accepted: Orders

## 2022-10-08 ENCOUNTER — Ambulatory Visit (INDEPENDENT_AMBULATORY_CARE_PROVIDER_SITE_OTHER): Payer: Medicare Other

## 2022-10-08 DIAGNOSIS — E538 Deficiency of other specified B group vitamins: Secondary | ICD-10-CM

## 2022-10-08 MED ORDER — CYANOCOBALAMIN 1000 MCG/ML IJ SOLN
1000.0000 ug | Freq: Once | INTRAMUSCULAR | Status: AC
Start: 2022-10-08 — End: 2022-10-08
  Administered 2022-10-08: 1000 ug via INTRAMUSCULAR

## 2022-10-08 NOTE — Progress Notes (Signed)
Pt presented for their vitamin B12 injection. Pt was identified through two identifiers. Pt tolerated shot well in their right deltoid.  

## 2022-10-11 ENCOUNTER — Encounter (INDEPENDENT_AMBULATORY_CARE_PROVIDER_SITE_OTHER): Payer: Self-pay

## 2022-10-22 DIAGNOSIS — T781XXA Other adverse food reactions, not elsewhere classified, initial encounter: Secondary | ICD-10-CM | POA: Diagnosis not present

## 2022-10-22 DIAGNOSIS — J305 Allergic rhinitis due to food: Secondary | ICD-10-CM | POA: Diagnosis not present

## 2022-11-01 DIAGNOSIS — N1832 Chronic kidney disease, stage 3b: Secondary | ICD-10-CM | POA: Diagnosis not present

## 2022-11-01 DIAGNOSIS — N1831 Chronic kidney disease, stage 3a: Secondary | ICD-10-CM | POA: Diagnosis not present

## 2022-11-01 DIAGNOSIS — I10 Essential (primary) hypertension: Secondary | ICD-10-CM | POA: Diagnosis not present

## 2022-11-01 DIAGNOSIS — I129 Hypertensive chronic kidney disease with stage 1 through stage 4 chronic kidney disease, or unspecified chronic kidney disease: Secondary | ICD-10-CM | POA: Diagnosis not present

## 2022-11-07 DIAGNOSIS — N1831 Chronic kidney disease, stage 3a: Secondary | ICD-10-CM | POA: Diagnosis not present

## 2022-11-07 DIAGNOSIS — N2581 Secondary hyperparathyroidism of renal origin: Secondary | ICD-10-CM | POA: Diagnosis not present

## 2022-11-07 DIAGNOSIS — I10 Essential (primary) hypertension: Secondary | ICD-10-CM | POA: Diagnosis not present

## 2022-11-07 DIAGNOSIS — I129 Hypertensive chronic kidney disease with stage 1 through stage 4 chronic kidney disease, or unspecified chronic kidney disease: Secondary | ICD-10-CM | POA: Diagnosis not present

## 2022-11-08 ENCOUNTER — Ambulatory Visit: Payer: Medicare Other

## 2022-11-08 ENCOUNTER — Encounter: Payer: Self-pay | Admitting: Gastroenterology

## 2022-11-14 ENCOUNTER — Encounter: Payer: Self-pay | Admitting: Gastroenterology

## 2022-11-15 ENCOUNTER — Ambulatory Visit: Payer: Medicare Other | Admitting: Certified Registered Nurse Anesthetist

## 2022-11-15 ENCOUNTER — Encounter: Payer: Self-pay | Admitting: Gastroenterology

## 2022-11-15 ENCOUNTER — Ambulatory Visit
Admission: RE | Admit: 2022-11-15 | Discharge: 2022-11-15 | Disposition: A | Discharge status: Home / Self Care | Payer: Medicare Other | Attending: Gastroenterology | Admitting: Gastroenterology

## 2022-11-15 ENCOUNTER — Other Ambulatory Visit: Payer: Self-pay

## 2022-11-15 ENCOUNTER — Encounter
Admission: RE | Disposition: A | Discharge status: Home / Self Care | Payer: Self-pay | Source: Home / Self Care | Attending: Gastroenterology

## 2022-11-15 DIAGNOSIS — D123 Benign neoplasm of transverse colon: Secondary | ICD-10-CM | POA: Diagnosis not present

## 2022-11-15 DIAGNOSIS — I10 Essential (primary) hypertension: Secondary | ICD-10-CM | POA: Insufficient documentation

## 2022-11-15 DIAGNOSIS — K635 Polyp of colon: Secondary | ICD-10-CM | POA: Diagnosis not present

## 2022-11-15 DIAGNOSIS — D122 Benign neoplasm of ascending colon: Secondary | ICD-10-CM | POA: Insufficient documentation

## 2022-11-15 DIAGNOSIS — K641 Second degree hemorrhoids: Secondary | ICD-10-CM | POA: Diagnosis not present

## 2022-11-15 DIAGNOSIS — Z87891 Personal history of nicotine dependence: Secondary | ICD-10-CM | POA: Insufficient documentation

## 2022-11-15 DIAGNOSIS — R42 Dizziness and giddiness: Secondary | ICD-10-CM | POA: Insufficient documentation

## 2022-11-15 DIAGNOSIS — N189 Chronic kidney disease, unspecified: Secondary | ICD-10-CM | POA: Diagnosis not present

## 2022-11-15 DIAGNOSIS — I129 Hypertensive chronic kidney disease with stage 1 through stage 4 chronic kidney disease, or unspecified chronic kidney disease: Secondary | ICD-10-CM | POA: Diagnosis not present

## 2022-11-15 DIAGNOSIS — E785 Hyperlipidemia, unspecified: Secondary | ICD-10-CM | POA: Diagnosis not present

## 2022-11-15 DIAGNOSIS — K5730 Diverticulosis of large intestine without perforation or abscess without bleeding: Secondary | ICD-10-CM | POA: Insufficient documentation

## 2022-11-15 DIAGNOSIS — Z1211 Encounter for screening for malignant neoplasm of colon: Secondary | ICD-10-CM

## 2022-11-15 DIAGNOSIS — K219 Gastro-esophageal reflux disease without esophagitis: Secondary | ICD-10-CM | POA: Insufficient documentation

## 2022-11-15 HISTORY — PX: POLYPECTOMY: SHX5525

## 2022-11-15 HISTORY — DX: Pneumonia, unspecified organism: J18.9

## 2022-11-15 HISTORY — PX: COLONOSCOPY WITH PROPOFOL: SHX5780

## 2022-11-15 HISTORY — DX: Chronic kidney disease, unspecified: N18.9

## 2022-11-15 SURGERY — COLONOSCOPY WITH PROPOFOL
Anesthesia: General

## 2022-11-15 MED ORDER — STERILE WATER FOR IRRIGATION IR SOLN
Status: DC | PRN
  Administered 2022-11-15: 120 mL

## 2022-11-15 MED ORDER — PROPOFOL 10 MG/ML IV BOLUS
INTRAVENOUS | Status: DC | PRN
  Administered 2022-11-15: 20 mg via INTRAVENOUS
  Administered 2022-11-15: 10 mg via INTRAVENOUS
  Administered 2022-11-15: 60 mg via INTRAVENOUS

## 2022-11-15 MED ORDER — PROPOFOL 500 MG/50ML IV EMUL
INTRAVENOUS | Status: DC | PRN
  Administered 2022-11-15: 150 ug/kg/min via INTRAVENOUS

## 2022-11-15 MED ORDER — SODIUM CHLORIDE 0.9 % IV SOLN: INTRAVENOUS | Status: DC

## 2022-11-15 NOTE — Anesthesia Preprocedure Evaluation (Signed)
Anesthesia Evaluation  Patient identified by MRN, date of birth, ID band Patient awake    Reviewed: Allergy & Precautions, NPO status , Patient's Chart, lab work & pertinent test results  History of Anesthesia Complications Negative for: history of anesthetic complications  Airway Mallampati: II  TM Distance: >3 FB Neck ROM: Full    Dental  (+) Partial Lower, Partial Upper, Dental Advidsory Given   Pulmonary neg sleep apnea, neg COPD, former smoker   breath sounds clear to auscultation- rhonchi (-) wheezing      Cardiovascular Exercise Tolerance: Good hypertension, Pt. on medications (-) CAD, (-) Past MI, (-) Cardiac Stents and (-) CABG  Rhythm:Regular Rate:Normal - Systolic murmurs and - Diastolic murmurs    Neuro/Psych negative neurological ROS  negative psych ROS   GI/Hepatic Neg liver ROS,GERD  ,,  Endo/Other  negative endocrine ROSneg diabetes    Renal/GU      Musculoskeletal   Abdominal   Peds  Hematology negative hematology ROS (+)   Anesthesia Other Findings Past Medical History: No date: Allergy     Comment:  seasonal rhinitis No date: Arthritis     Comment:  right shoulder,  no intervention No date: Cancer [facility])     Comment:  basal cell  No date: GERD (gastroesophageal reflux disease) No date: Hyperlipidemia No date: Hypertension Oct 2011: Irritable bowel syndrome (IBS)     Comment:  last colonoscopy showed diverticulum, no polyps No date: Neuropathy     Comment:  FEET No date: Vertigo     Comment:  OCCAS   Reproductive/Obstetrics                             Anesthesia Physical Anesthesia Plan  ASA: 3  Anesthesia Plan: General   Post-op Pain Management: Minimal or no pain anticipated   Induction: Intravenous  PONV Risk Score and Plan: 3 and Propofol infusion, TIVA and Ondansetron  Airway Management Planned: Nasal Cannula  Additional Equipment:  None  Intra-op Plan:   Post-operative Plan:   Informed Consent: I have reviewed the patients History and Physical, chart, labs and discussed the procedure including the risks, benefits and alternatives for the proposed anesthesia with the patient or authorized representative who has indicated his/her understanding and acceptance.     Dental advisory given  Plan Discussed with: CRNA and Surgeon  Anesthesia Plan Comments: (Discussed risks of anesthesia with patient, including possibility of difficulty with spontaneous ventilation under anesthesia necessitating airway intervention, PONV, and rare risks such as cardiac or respiratory or neurological events, and allergic reactions. Discussed the role of CRNA in patient's perioperative care. Patient understands.)       Anesthesia Quick Evaluation

## 2022-11-15 NOTE — H&P (Signed)
Midge Minium, MD [facility] [address], [address] [place], [place] [address] Phone: [phone] Fax : [phone]  Primary Care Physician:  Sherlene Shams, MD Primary Gastroenterologist:  Dr. Servando Snare  Pre-Procedure History & Physical: HPI:  Jillian [name] is a 72 y.o. female is here for a screening colonoscopy.   Past Medical History:  Diagnosis Date   Allergy    seasonal rhinitis   Arthritis    right shoulder,  no intervention   Cancer (HCC)    basal cell    Chronic kidney disease    Chronic right maxillary sinusitis 08/13/2018   GERD (gastroesophageal reflux disease)    Hyperlipidemia    Hypertension    Irritable bowel syndrome (IBS) 11/2009   last colonoscopy showed diverticulum, no polyps   Neuropathy    FEET   Pneumonia    Thrombophlebitis of left lower extremity (HCC) 09/29/2019   Vertigo    OCCAS    Past Surgical History:  Procedure Laterality Date   BREAST BIOPSY Right 03/12/2019   [place] bx neg   CATARACT EXTRACTION W/PHACO Left 04/09/2017   Procedure: CATARACT EXTRACTION PHACO AND INTRAOCULAR LENS PLACEMENT (IOC);  Surgeon: Galen Manila, MD;  Location: [facility] ORS;  Service: Ophthalmology;  Laterality: Left;  [place] 00:29.6 AP% 18.4 CDE 5.44 FLUID PACK LOT # [id number]   CATARACT EXTRACTION W/PHACO Right 04/30/2017   Procedure: CATARACT EXTRACTION PHACO AND INTRAOCULAR LENS PLACEMENT (IOC);  Surgeon: Galen Manila, MD;  Location: [facility] ORS;  Service: Ophthalmology;  Laterality: Right;  [place] 00:43.1 AP% 13.7 CDE 5.91 Fluid Pack Lot # [id number]   COLONOSCOPY     EYE SURGERY     right breast bx  03/12/2019   TONSILLECTOMY AND ADENOIDECTOMY  1956   TUBAL LIGATION  1977   VEIN SURGERY      Prior to Admission medications   Medication Sig Start Date End Date Taking? Authorizing Provider  amLODipine (NORVASC) 2.5 MG tablet TAKE ONE TABLET BY MOUTH DAILY 03/22/22  Yes Sherlene Shams, MD  aspirin 81 MG tablet Take 81 mg by mouth daily.   Yes [provider]  cetirizine (ZYRTEC) 10 MG tablet Take 10 mg by mouth at bedtime.   Yes [provider]  Cholecalciferol (VITAMIN D3) 1000 UNITS CAPS Take 1,000 Units by mouth daily.   Yes [provider]  estradiol (ESTRACE VAGINAL) 0.1 MG/GM vaginal cream Place 0.5 g vaginally 3 (three) times a week. 12/21/20  Yes Hildred Laser, MD  famotidine (PEPCID) 10 MG tablet Take 10 mg by mouth daily. As needed   Yes [provider]  Psyllium (METAMUCIL FIBER PO) Take by mouth. Take 1 tablet daily.   Yes [provider]  simvastatin (ZOCOR) 40 MG tablet TAKE ONE TABLET BY MOUTH EVERY NIGHT AT BEDTIME 03/22/22  Yes Sherlene Shams, MD  telmisartan (MICARDIS) 80 MG tablet Take 1 tablet (80 mg total) by mouth every evening. 05/14/22  Yes Sherlene Shams, MD  traZODone (DESYREL) 50 MG tablet Take 0.5-1 tablets (25-50 mg total) by mouth at bedtime as needed for sleep. 05/14/22  Yes Sherlene Shams, MD  Fluocinolone Acetonide 0.01 % OIL Apply 1 application topically See admin instructions. Apply to affected ear daily as needed for dermatisis 01/20/17   [provider]  fluticasone (FLONASE) 50 MCG/ACT nasal spray PLACE 2 SPRAYS INTO THE NOSE DAILY. 09/20/16   Sherlene Shams, MD  glycerin adult 2 g suppository Place 1 suppository rectally as needed for constipation. 09/04/22  Pilar Jarvis, MD  NON FORMULARY Apply 1 application topically daily as needed (for dermatisis on face). Hydrocortisone 2.5%/Ketoconazole 2% Cream    [provider]  nystatin (MYCOSTATIN/NYSTOP) powder Apply topically 2 (two) times daily. To rash until resolved. 08/12/18   Sherlene Shams, MD  ondansetron (ZOFRAN) 4 MG tablet Take 1 tablet (4 mg total) by mouth every 8 (eight) hours as needed for nausea or vomiting. 07/19/22   Kara Dies, NP    Allergies as of 09/28/2022 - Review Complete 09/27/2022  Allergen Reaction Noted   Doxycycline Nausea And Vomiting 11/09/2015   Gabapentin   08/03/2021   Sulfa drugs cross reactors Rash 12/14/2010   Tetanus toxoids Rash 12/14/2010    Family History  Problem Relation Age of Onset   Hyperlipidemia Mother    Hyperlipidemia Father    Hypertension Father    Breast cancer Sister 30   Cancer Sister        'blood cancer', dx 77   Hyperlipidemia Maternal Grandmother    Hyperlipidemia Maternal Grandfather    Cancer Cousin     Social History   Socioeconomic History   Marital status: Divorced    Spouse name: Not on file   Number of children: Not on file   Years of education: Not on file   Highest education level: Some college, no degree  Occupational History   Occupation: cutsodial    Employer: [employer]    Comment: works at [employer]  Tobacco Use   Smoking status: Former    Current packs/day: 0.00    Types: Cigarettes    Quit date: 03/16/1999    Years since quitting: 23.6   Smokeless tobacco: Never  Vaping Use   Vaping status: Never Used  Substance and Sexual Activity   Alcohol use: No    Alcohol/week: 0.0 standard drinks of alcohol   Drug use: No   Sexual activity: Not Currently  Other Topics Concern   Not on file  Social History Narrative   Lives in [place]   Social Determinants of Health   Financial Resource Strain: Low Risk  (09/05/2022)   Overall Financial Resource Strain (CARDIA)    Difficulty of Paying Living Expenses: Not hard at all  Food Insecurity: No Food Insecurity (09/05/2022)   Hunger Vital Sign    Worried About Running Out of Food in the Last Year: Never true    Ran Out of Food in the Last Year: Never true  Transportation Needs: No Transportation Needs (09/05/2022)   PRAPARE - Administrator, Civil Service (Medical): No    Lack of Transportation (Non-Medical): No  Physical Activity: Insufficiently Active (09/05/2022)   Exercise Vital Sign    Days of Exercise per Week: 3 days    Minutes of Exercise per Session: 30 min  Stress: No Stress Concern Present (09/05/2022)   [employer] of Occupational [employer] - Occupational Stress Questionnaire    Feeling of Stress : Only a little  Social Connections: Moderately Isolated (09/05/2022)   Social Connection and Isolation Panel [NHANES]    Frequency of Communication with Friends and Family: More than three times a week    Frequency of Social Gatherings with Friends and Family: Once a week    Attends Religious Services: Never    Database administrator or Organizations: No    Attends Engineer, structural: More than 4 times per year    Marital Status: Divorced  Intimate Partner Violence: Not At Risk (03/02/2022)   Humiliation,  Afraid, Rape, and Kick questionnaire    Fear of Current or Ex-Partner: No    Emotionally Abused: No    Physically Abused: No    Sexually Abused: No    Review of Systems: See HPI, otherwise negative ROS  Physical Exam: BP (!) 154/69   Pulse 62   Temp 97.6 F (36.4 C) (Temporal)   Resp 16   Ht 5\' 6"  (1.676 m)   Wt 89.9 kg   SpO2 99%   BMI 31.99 kg/m  General:   Alert,  pleasant and cooperative in NAD Head:  Normocephalic and atraumatic. Neck:  Supple; no masses or thyromegaly. Lungs:  Clear throughout to auscultation.    Heart:  Regular rate and rhythm. Abdomen:  Soft, nontender and nondistended. Normal bowel sounds, without guarding, and without rebound.   Neurologic:  Alert and  oriented x4;  grossly normal neurologically.  Impression/Plan: Jillian [name] is now here to undergo a screening colonoscopy.  Risks, benefits, and alternatives regarding colonoscopy have been reviewed with the patient.  Questions have been answered.  All parties agreeable.

## 2022-11-15 NOTE — Anesthesia Postprocedure Evaluation (Signed)
Anesthesia Post Note  Patient: Jillian [name]  Procedure(s) Performed: COLONOSCOPY WITH PROPOFOL POLYPECTOMY  Patient location during evaluation: Endoscopy Anesthesia Type: General Level of consciousness: awake and alert Pain management: pain level controlled Vital Signs Assessment: post-procedure vital signs reviewed and stable Respiratory status: spontaneous breathing, nonlabored ventilation, respiratory function stable and patient connected to nasal cannula oxygen Cardiovascular status: blood pressure returned to baseline and stable Postop Assessment: no apparent nausea or vomiting Anesthetic complications: no  No notable events documented.   Last Vitals:  Vitals:   11/15/22 1138 11/15/22 1140  BP:  133/75  Pulse:    Resp: 20 (!) 21  Temp:    SpO2:      Last Pain:  Vitals:   11/15/22 1128  TempSrc: Temporal  PainSc:                  Stephanie Coup

## 2022-11-15 NOTE — Transfer of Care (Addendum)
Immediate Anesthesia Transfer of Care Note  Patient: Jillian [name]  Procedure(s) Performed: COLONOSCOPY WITH PROPOFOL POLYPECTOMY  Patient Location: PACU  Anesthesia Type:General  Level of Consciousness: drowsy  Airway & Oxygen Therapy: Patient Spontanous Breathing and Patient connected to nasal cannula oxygen  Post-op Assessment: Report given to RN and Post -op Vital signs reviewed and stable  Post vital signs: Reviewed and stable  Last Vitals:  Vitals Value Taken Time  BP 96/55 11/15/22 1128  Temp 35.7 C 11/15/22 1128  Pulse 57 11/15/22 1129  Resp 22 11/15/22 1132  SpO2 99 % 11/15/22 1129  Vitals shown include unfiled device data.  Last Pain:  Vitals:   11/15/22 1128  TempSrc: Temporal  PainSc:          Complications: No notable events documented.

## 2022-11-15 NOTE — Op Note (Signed)
[facility] Gastroenterology Patient Name: Jillian [name] Procedure Date: 11/15/2022 10:48 AM MRN: [id number] Account #: [id number] Date of Birth: [birth year] Admit Type: Outpatient Age: 72 Room: [facility] ENDO ROOM 4 Gender: Female Note Status: Finalized Instrument Name: Nelda Marseille [id number] Procedure:             Colonoscopy Indications:           Screening for colorectal malignant neoplasm Providers:             Midge Minium MD, MD Referring MD:          Duncan Dull, MD (Referring MD) Medicines:             Propofol per Anesthesia Complications:         No immediate complications. Procedure:             Pre-Anesthesia Assessment:                        - Prior to the procedure, a History and Physical was                         performed, and patient medications and allergies were                         reviewed. The patient's tolerance of previous                         anesthesia was also reviewed. The risks and benefits                         of the procedure and the sedation options and risks                         were discussed with the patient. All questions were                         answered, and informed consent was obtained. Prior                         Anticoagulants: The patient has taken no anticoagulant                         or antiplatelet agents. ASA Grade Assessment: II - A                         patient with mild systemic disease. After reviewing                         the risks and benefits, the patient was deemed in                         satisfactory condition to undergo the procedure.                        After obtaining informed consent, the colonoscope was                         passed under direct vision. Throughout the procedure,  the patient's blood pressure, pulse, and oxygen                         saturations were monitored continuously. The                         Colonoscope was introduced  through the anus and                         advanced to the the cecum, identified by appendiceal                         orifice and ileocecal valve. The colonoscopy was                         performed without difficulty. The patient tolerated                         the procedure well. The quality of the bowel                         preparation was good. Findings:      The perianal and digital rectal examinations were normal.      A 3 mm polyp was found in the ascending colon. The polyp was sessile.       The polyp was removed with a cold snare. Resection and retrieval were       complete.      A 4 mm polyp was found in the transverse colon. The polyp was sessile.       The polyp was removed with a cold snare. Resection and retrieval were       complete.      A few small-mouthed diverticula were found in the entire colon.      Non-bleeding internal hemorrhoids were found during retroflexion. The       hemorrhoids were Grade II (internal hemorrhoids that prolapse but reduce       spontaneously). Impression:            - One 3 mm polyp in the ascending colon, removed with                         a cold snare. Resected and retrieved.                        - One 4 mm polyp in the transverse colon, removed with                         a cold snare. Resected and retrieved.                        - Diverticulosis in the entire examined colon.                        - Non-bleeding internal hemorrhoids. Recommendation:        - Discharge patient to home.                        - Resume previous diet.                        -  Continue present medications.                        - Await pathology results. Procedure Code(s):     --- Professional ---                        [phone], Colonoscopy, flexible; with removal of                         tumor(s), polyp(s), or other lesion(s) by snare                         technique Diagnosis Code(s):     --- Professional ---                         Z12.11, Encounter for screening for malignant neoplasm                         of colon                        D12.2, Benign neoplasm of ascending colon CPT copyright 2022 American Medical Association. All rights reserved. The codes documented in this report are preliminary and upon coder review may  be revised to meet current compliance requirements. Midge Minium MD, MD 11/15/2022 11:22:13 AM This report has been signed electronically. Number of Addenda: 0 Note Initiated On: 11/15/2022 10:48 AM Scope Withdrawal Time: 0 hours 9 minutes 44 seconds  Total Procedure Duration: 0 hours 14 minutes 24 seconds  Estimated Blood Loss:  Estimated blood loss: none.      [facility]

## 2022-11-16 ENCOUNTER — Encounter: Payer: Self-pay | Admitting: Gastroenterology

## 2022-11-16 LAB — SURGICAL PATHOLOGY

## 2022-11-19 ENCOUNTER — Ambulatory Visit (INDEPENDENT_AMBULATORY_CARE_PROVIDER_SITE_OTHER): Payer: Medicare Other

## 2022-11-19 DIAGNOSIS — E538 Deficiency of other specified B group vitamins: Secondary | ICD-10-CM

## 2022-11-19 MED ORDER — CYANOCOBALAMIN 1000 MCG/ML IJ SOLN
1000.0000 ug | Freq: Once | INTRAMUSCULAR | Status: AC
Start: 2022-11-19 — End: 2022-11-19
  Administered 2022-11-19: 1000 ug via INTRAMUSCULAR

## 2022-11-19 NOTE — Progress Notes (Signed)
Pt presented for their vitamin B12 injection. Pt was identified through two identifiers. Pt tolerated shot well in their left  deltoid.  

## 2022-11-23 ENCOUNTER — Ambulatory Visit (INDEPENDENT_AMBULATORY_CARE_PROVIDER_SITE_OTHER): Payer: Medicare Other | Admitting: Internal Medicine

## 2022-11-23 ENCOUNTER — Encounter: Payer: Self-pay | Admitting: Internal Medicine

## 2022-11-23 VITALS — BP 120/80 | HR 62 | Temp 98.0°F | Ht 66.0 in | Wt 200.6 lb

## 2022-11-23 DIAGNOSIS — Z0000 Encounter for general adult medical examination without abnormal findings: Secondary | ICD-10-CM | POA: Diagnosis not present

## 2022-11-23 DIAGNOSIS — D126 Benign neoplasm of colon, unspecified: Secondary | ICD-10-CM | POA: Diagnosis not present

## 2022-11-23 DIAGNOSIS — E6609 Other obesity due to excess calories: Secondary | ICD-10-CM

## 2022-11-23 DIAGNOSIS — E782 Mixed hyperlipidemia: Secondary | ICD-10-CM | POA: Diagnosis not present

## 2022-11-23 DIAGNOSIS — I10 Essential (primary) hypertension: Secondary | ICD-10-CM | POA: Diagnosis not present

## 2022-11-23 DIAGNOSIS — Z1231 Encounter for screening mammogram for malignant neoplasm of breast: Secondary | ICD-10-CM | POA: Diagnosis not present

## 2022-11-23 DIAGNOSIS — Z6832 Body mass index (BMI) 32.0-32.9, adult: Secondary | ICD-10-CM | POA: Diagnosis not present

## 2022-11-23 DIAGNOSIS — R7303 Prediabetes: Secondary | ICD-10-CM | POA: Diagnosis not present

## 2022-11-23 LAB — LIPID PANEL
Cholesterol: 172 mg/dL (ref 0–200)
HDL: 61.5 mg/dL (ref >39.00)
LDL Cholesterol: 77 mg/dL (ref 0–99)
NonHDL: 110.34
Total CHOL/HDL Ratio: 3
Triglycerides: 168 mg/dL — ABNORMAL HIGH (ref 0.0–149.0)
VLDL: 33.6 mg/dL (ref 0.0–40.0)

## 2022-11-23 LAB — COMPREHENSIVE METABOLIC PANEL
ALT: 11 U/L (ref 0–35)
AST: 14 U/L (ref 0–37)
Albumin: 4.2 g/dL (ref 3.5–5.2)
Alkaline Phosphatase: 73 U/L (ref 39–117)
BUN: 23 mg/dL (ref 6–23)
CO2: 26 meq/L (ref 19–32)
Calcium: 9.3 mg/dL (ref 8.4–10.5)
Chloride: 106 meq/L (ref 96–112)
Creatinine, Ser: 1.32 mg/dL — ABNORMAL HIGH (ref 0.40–1.20)
GFR: 40.11 mL/min — ABNORMAL LOW (ref >60.00)
Glucose, Bld: 93 mg/dL (ref 70–99)
Potassium: 4.6 meq/L (ref 3.5–5.1)
Sodium: 140 meq/L (ref 135–145)
Total Bilirubin: 0.4 mg/dL (ref 0.2–1.2)
Total Protein: 6.6 g/dL (ref 6.0–8.3)

## 2022-11-23 LAB — TSH: TSH: 1.04 u[IU]/mL (ref 0.35–5.50)

## 2022-11-23 LAB — MICROALBUMIN / CREATININE URINE RATIO
Creatinine,U: 39.9 mg/dL
Microalb Creat Ratio: 1.8 mg/g (ref 0.0–30.0)
Microalb, Ur: <0.7 mg/dL (ref 0.0–1.9)

## 2022-11-23 LAB — HEMOGLOBIN A1C: Hgb A1c MFr Bld: 5.6 % (ref 4.6–6.5)

## 2022-11-23 LAB — LDL CHOLESTEROL, DIRECT: Direct LDL: 81 mg/dL

## 2022-11-23 MED ORDER — TELMISARTAN 80 MG PO TABS: 80.0000 mg | ORAL_TABLET | Freq: Every evening | ORAL | 1 refills | Status: DC

## 2022-11-23 NOTE — Patient Instructions (Signed)
Your annual mammogram has been ordered AND IS DUE in  [place] ,   [facility] will not allow [place] to schedule it for you,  so please  call to make your appointment [phone]    WE WILL REPEAT YOUR BONE DENSITY EXAM IN 2026

## 2022-11-23 NOTE — Progress Notes (Unsigned)
Patient ID: Jillian [name], female    DOB: 1950/09/30  Age: 72 y.o. MRN: [id number]  The patient is here for annual preventive examination and management of other chronic and acute problems.   The risk factors are reflected in the social history.   The roster of all physicians providing medical care to patient - is listed in the Snapshot section of the chart.   Activities of daily living:  The patient is 100% independent in all ADLs: dressing, toileting, feeding as well as independent mobility   Home safety : The patient has smoke detectors in the home. They wear seatbelts.  There are no unsecured firearms at home. There is no violence in the home.    There is no risks for hepatitis, STDs or HIV. There is no   history of blood transfusion. They have no travel history to infectious disease endemic areas of the world.   The patient has seen their dentist in the last six month. They have seen their eye doctor in the last year. The patinet  denies slight hearing difficulty with regard to whispered voices and some television programs.  They have deferred audiologic testing in the last year.  They do not  have excessive sun exposure. Discussed the need for sun protection: hats, long sleeves and use of sunscreen if there is significant sun exposure.    Diet: the importance of a healthy diet is discussed. They do have a healthy diet.   The benefits of regular aerobic exercise were discussed. The patient  walks  daily for  60 minutes.    Depression screen: there are no signs or vegative symptoms of depression- irritability, change in appetite, anhedonia, sadness/tearfullness.   The following portions of the patient's history were reviewed and updated as appropriate: allergies, current medications, past family history, past medical history,  past surgical history, past social history  and problem list.   Visual acuity was not assessed per patient preference since the patient has regular follow up  with an  ophthalmologist. Hearing and body mass index were assessed and reviewed.    During the course of the visit the patient was educated and counseled about appropriate screening and preventive services including : fall prevention , diabetes screening, nutrition counseling, colorectal cancer screening, and recommended immunizations.    Chief Complaint:  IBS diarrhea predominant   colonoscopy reviewed.  7 yr follow up advised for TA's    Review of Symptoms  Patient denies headache, fevers, malaise, unintentional weight loss, skin rash, eye pain, sinus congestion and sinus pain, sore throat, dysphagia,  hemoptysis , cough, dyspnea, wheezing, chest pain, palpitations, orthopnea, edema, abdominal pain, nausea, melena, diarrhea, constipation, flank pain, dysuria, hematuria, urinary  Frequency, nocturia, numbness, tingling, seizures,  Focal weakness, Loss of consciousness,  Tremor, insomnia, depression, anxiety, and suicidal ideation.    Physical Exam:  BP 120/80   Pulse 62   Temp 98 F (36.7 C) (Oral)   Ht 5\' 6"  (1.676 m)   Wt 200 lb 9.6 oz (91 kg)   SpO2 95%   BMI 32.38 kg/m    Physical Exam Vitals reviewed.  Constitutional:      General: She is not in acute distress.    Appearance: Normal appearance. She is well-developed and normal weight. She is not ill-appearing, toxic-appearing or diaphoretic.  HENT:     Head: Normocephalic.     Right Ear: Tympanic membrane, ear canal and external ear normal. There is no impacted cerumen.     Left Ear:  Tympanic membrane, ear canal and external ear normal. There is no impacted cerumen.     Nose: Nose normal.     Mouth/Throat:     Mouth: Mucous membranes are moist.     Pharynx: Oropharynx is clear.  Eyes:     General: No scleral icterus.       Right eye: No discharge.        Left eye: No discharge.     Conjunctiva/sclera: Conjunctivae normal.     Pupils: Pupils are equal, round, and reactive to light.  Neck:     Thyroid: No  thyromegaly.     Vascular: No carotid bruit or JVD.  Cardiovascular:     Rate and Rhythm: Normal rate and regular rhythm.     Heart sounds: Normal heart sounds.  Pulmonary:     Effort: Pulmonary effort is normal. No respiratory distress.     Breath sounds: Normal breath sounds.  Chest:  Breasts:    Breasts are symmetrical.     Right: Normal. No swelling, inverted nipple, mass, nipple discharge, skin change or tenderness.     Left: Normal. No swelling, inverted nipple, mass, nipple discharge, skin change or tenderness.  Abdominal:     General: Bowel sounds are normal.     Palpations: Abdomen is soft. There is no mass.     Tenderness: There is no abdominal tenderness. There is no guarding or rebound.  Musculoskeletal:        General: Normal range of motion.     Cervical back: Normal range of motion and neck supple.  Lymphadenopathy:     Cervical: No cervical adenopathy.     Upper Body:     Right upper body: No supraclavicular, axillary or pectoral adenopathy.     Left upper body: No supraclavicular, axillary or pectoral adenopathy.  Skin:    General: Skin is warm and dry.  Neurological:     General: No focal deficit present.     Mental Status: She is alert and oriented to person, place, and time. Mental status is at baseline.  Psychiatric:        Mood and Affect: Mood normal.        Behavior: Behavior normal.        Thought Content: Thought content normal.        Judgment: Judgment normal.    Assessment and Plan: Primary hypertension -     Comprehensive metabolic panel -     Microalbumin / creatinine urine ratio  Mixed hyperlipidemia -     Lipid panel -     LDL cholesterol, direct  Prediabetes -     Comprehensive metabolic panel -     Hemoglobin A1c -     Microalbumin / creatinine urine ratio  Class 1 obesity due to excess calories without serious comorbidity with body mass index (BMI) of 32.0 to 32.9 in adult -     TSH  Tubular adenoma of colon Assessment &  Plan: 2 RETRIEVED FROM COLONOSCOPY SEPT 2024.  FOLLOW UP 7 YEARS PER [facility]    Breast cancer screening by mammogram -     3D Screening Mammogram, Left and Right; Future  Encounter for preventive health examination Assessment & Plan: age appropriate education and counseling updated, referrals for preventative services and immunizations addressed, dietary and smoking counseling addressed, most recent labs reviewed.  I have personally reviewed and have noted:   1) the patient's medical and social history 2) The pt's use of alcohol, tobacco, and illicit drugs 3)  The patient's current medications and supplements 4) Functional ability including ADL's, fall risk, home safety risk, hearing and visual impairment 5) Diet and physical activities 6) Evidence for depression or mood disorder  I have made referrals, and provided counseling and education based on review of the above    Encounter for screening mammogram for malignant neoplasm of breast Assessment & Plan: Return to annual screening    Other orders -     Telmisartan; Take 1 tablet (80 mg total) by mouth every evening.  Dispense: 90 tablet; Refill: 1    No follow-ups on file.  Sherlene Shams, MD

## 2022-11-23 NOTE — Assessment & Plan Note (Signed)
2 RETRIEVED FROM COLONOSCOPY SEPT 2024.  FOLLOW UP 7 YEARS PER [facility]

## 2022-11-24 NOTE — Assessment & Plan Note (Signed)
age appropriate education and counseling updated, referrals for preventative services and immunizations addressed, dietary and smoking counseling addressed, most recent labs reviewed.  I have personally reviewed and have noted:   1) the patient's medical and social history 2) The pt's use of alcohol, tobacco, and illicit drugs 3) The patient's current medications and supplements 4) Functional ability including ADL's, fall risk, home safety risk, hearing and visual impairment 5) Diet and physical activities 6) Evidence for depression or mood disorder    I have made referrals, and provided counseling and education based on review of the above 

## 2022-11-24 NOTE — Assessment & Plan Note (Signed)
Return to annual screening

## 2022-11-25 IMAGING — MG DIGITAL DIAGNOSTIC BILAT W/ TOMO W/ CAD
6 of 10 series · 6 of 30 positions shown · non-contrast
Comparison: Previous exams including diagnostic mammogram and
ultrasound dated 09/10/2019

CLINICAL DATA: Follow-up for probably benign mass in the RIGHT
breast at the 9 o'clock axis. Status post benign ultrasound-guided
biopsy of a RIGHT breast mass at the 10 o'clock axis in Wednesday February, 2019.

EXAM:
DIGITAL DIAGNOSTIC BILATERAL MAMMOGRAM WITH CAD AND TOMOSYNTHESIS
ULTRASOUND BREAST RIGHT
TECHNIQUE: Bilateral digital diagnostic mammography and breast tomosynthesis
was performed. Digital images of the bilateral breasts were
evaluated with computer-aided detection. Targeted ultrasound
examination of the right breast was performed.

[R CC synth-2D (1 of 2)]
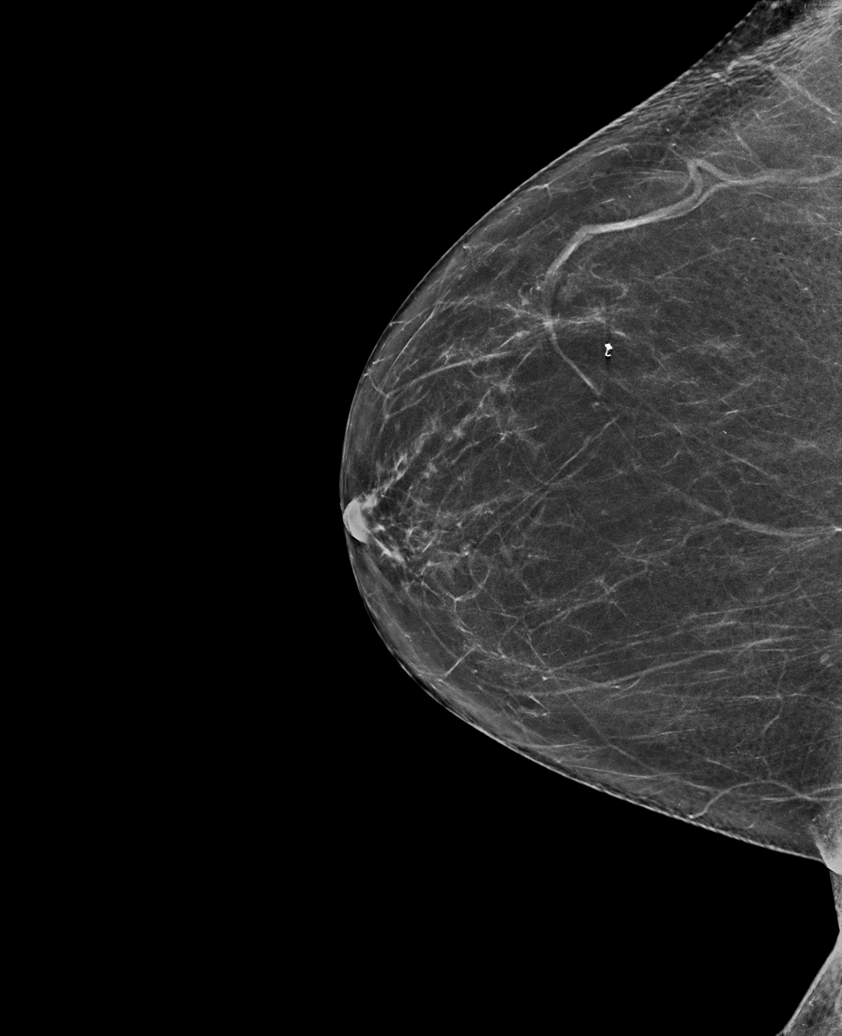

[L MLO synth-2D]
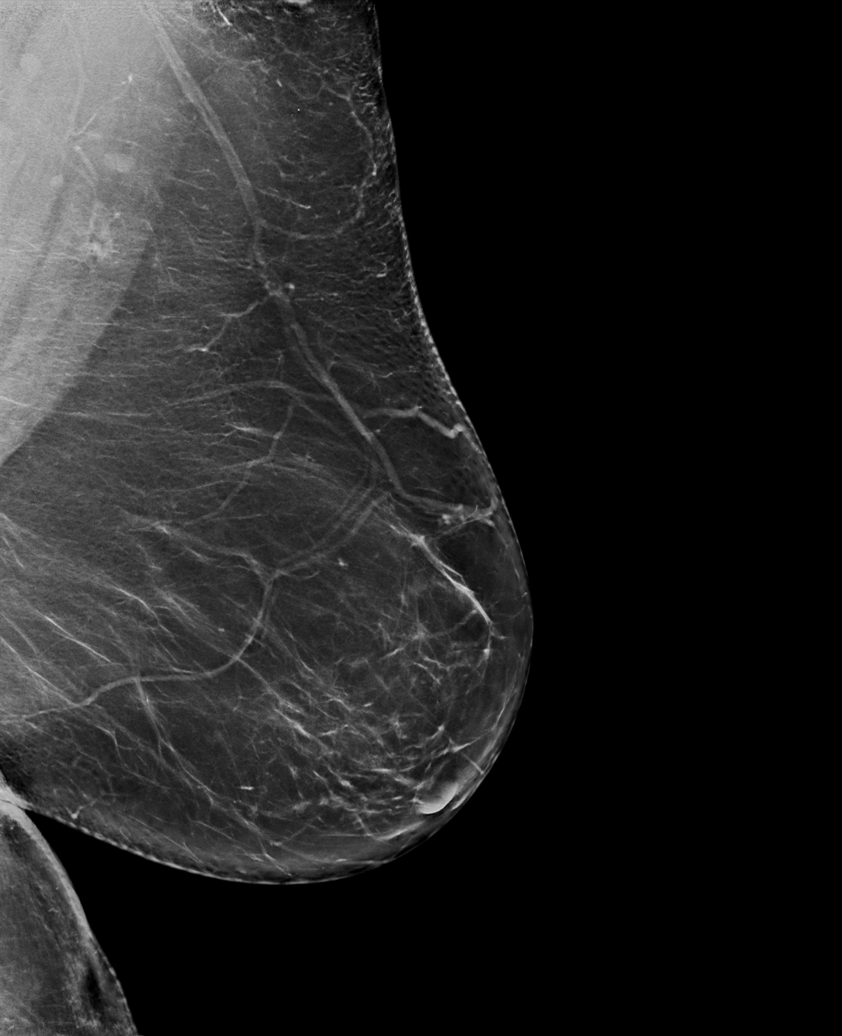

[R MLO synth-2D]
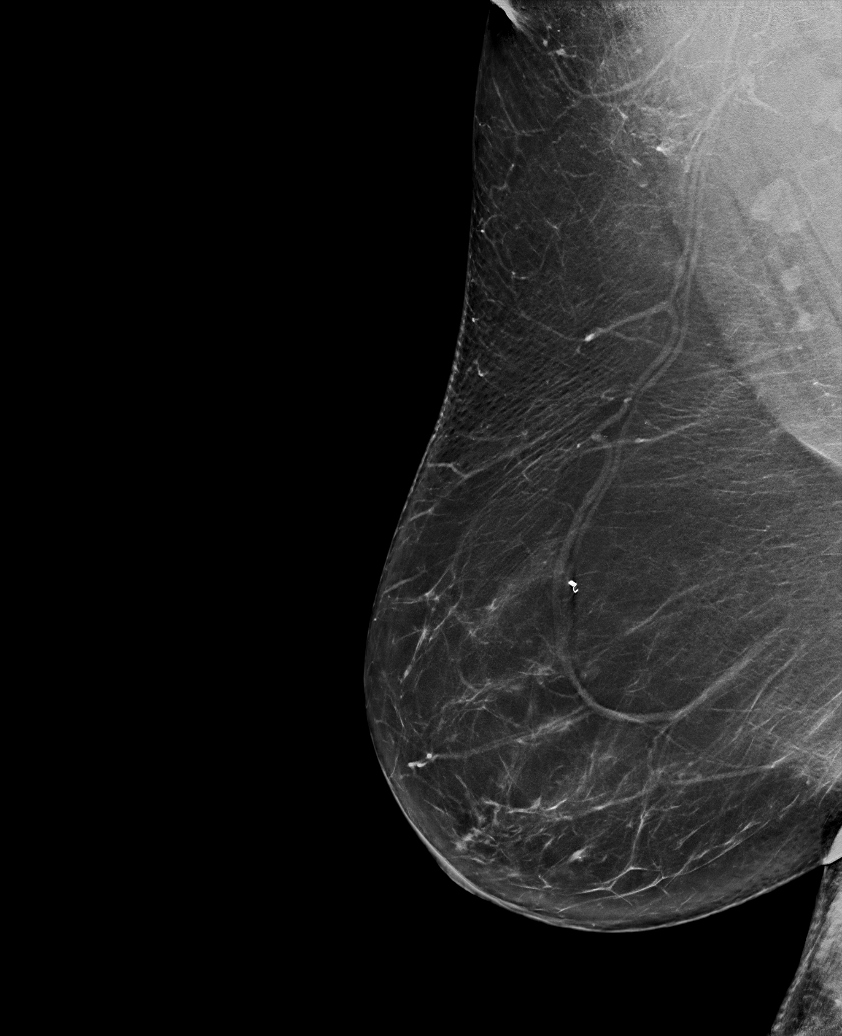

[R CC synth-2D (2 of 2)]
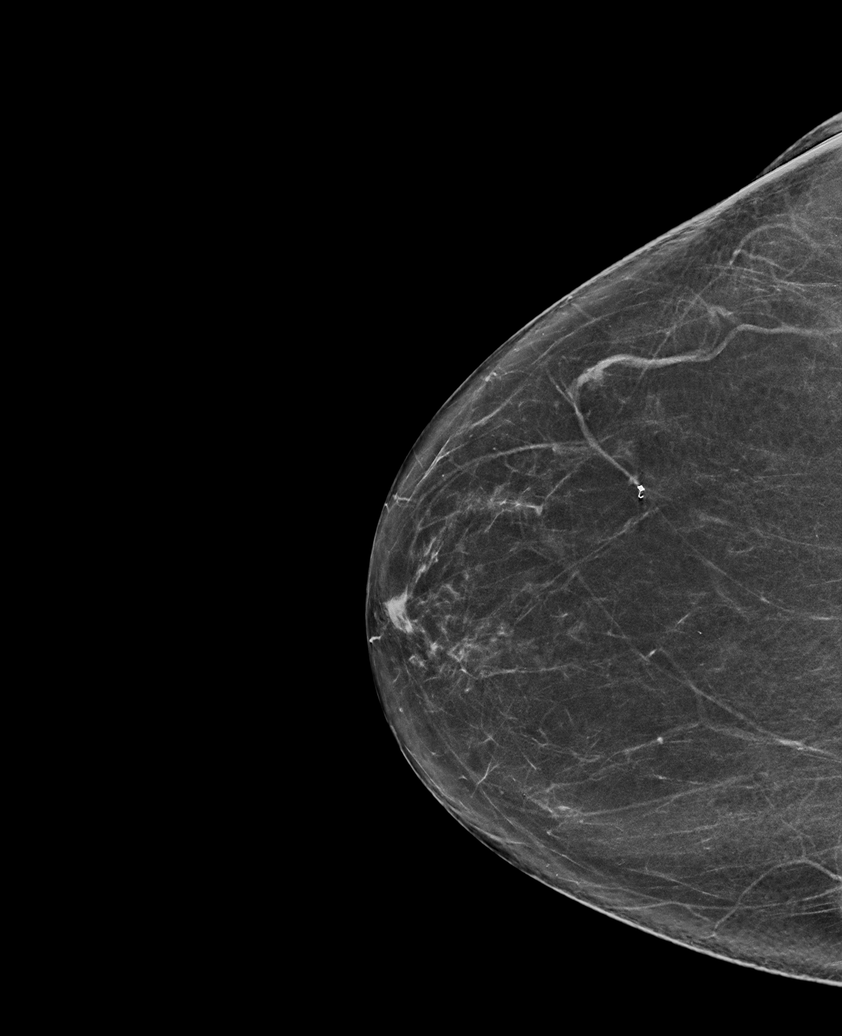

[L CC synth-2D]
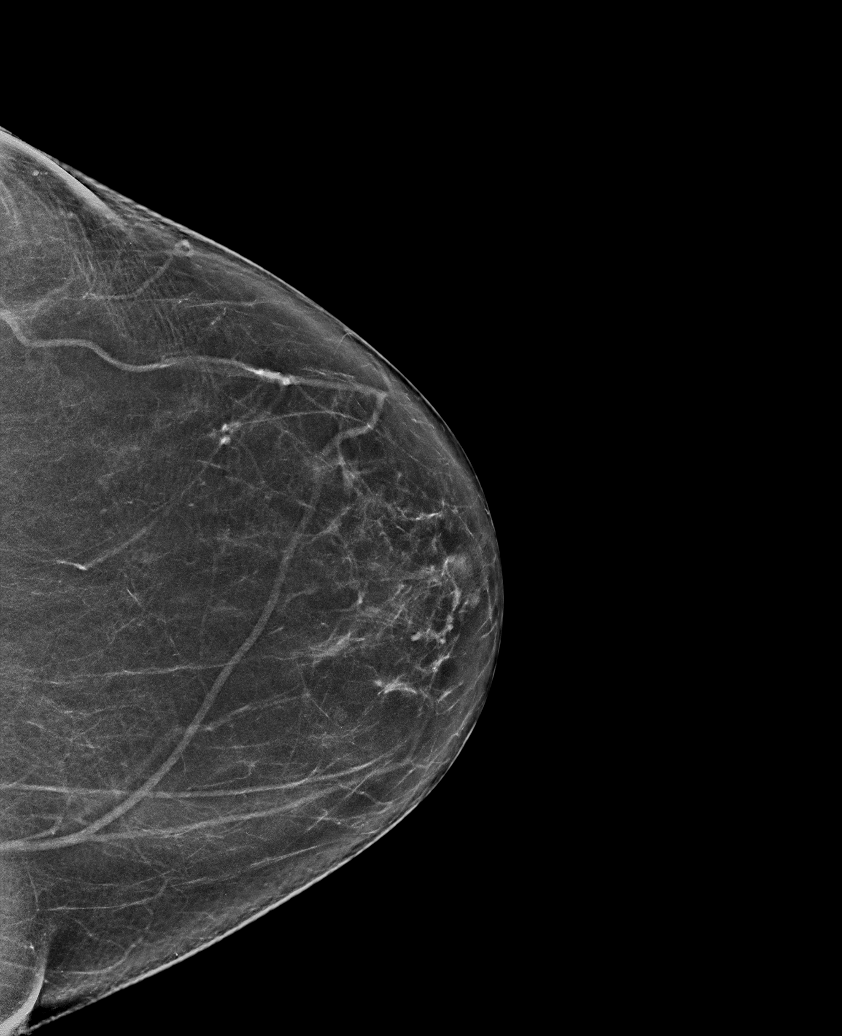

[R CC tomo · tomo slice 34/67.0]
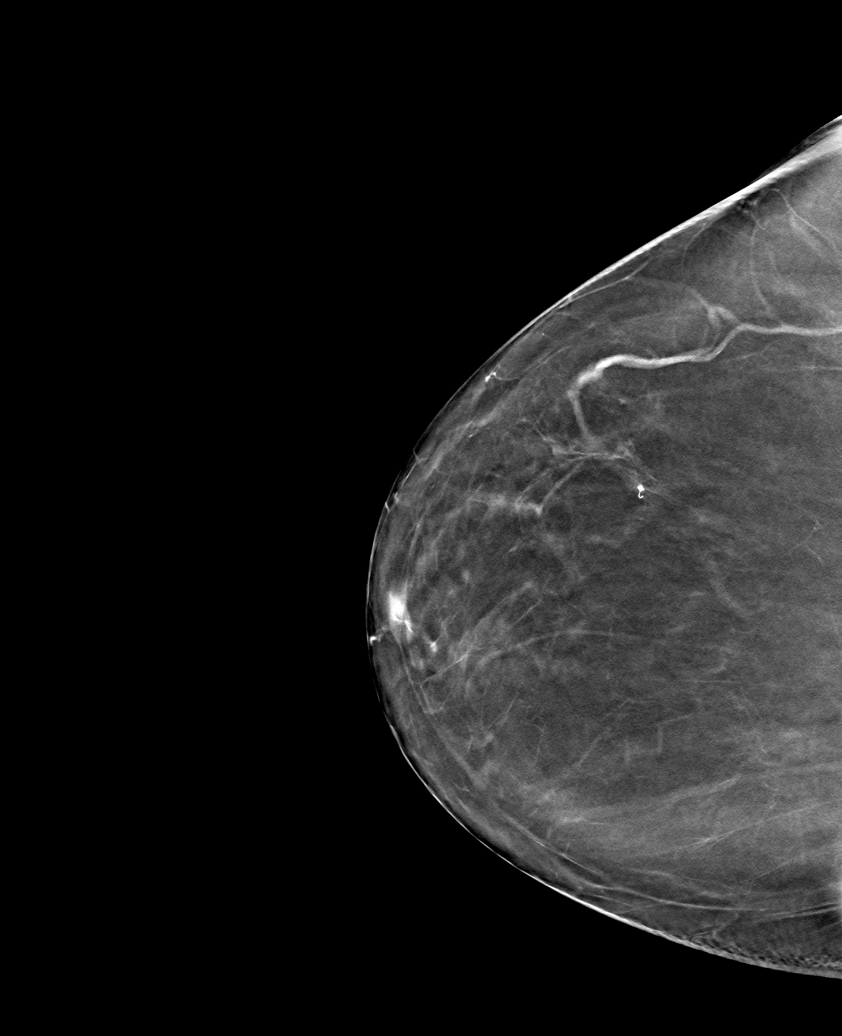

[6 of 30 positions shown; findings below may reference images not displayed]

ACR Breast Density Category b: There are scattered areas of
fibroglandular density.
FINDINGS: Biopsy site marker within the outer RIGHT breast is stable in
position, corresponding to a previous benign biopsy site. Adjacent
low-density mass within the outer RIGHT breast is stable.

There are no new dominant masses, suspicious calcifications or
secondary signs of malignancy within either breast.

Targeted ultrasound is performed, again showing the oval
circumscribed mass within the RIGHT breast at the 9 o'clock axis, 4
cm from the nipple, again most suggestive of a benign cyst or
cluster of cysts, measuring 6 x 3 x 4 mm, slightly decreased in size
compared to the previous studies suggesting benignity.
IMPRESSION: 1. Probably benign cyst or cluster of cysts within the RIGHT breast
at the 9 o'clock axis, 4 cm from the nipple, measuring 6 x 3 x 4 mm,
slightly decreased in size compared to previous ultrasound
suggesting benignity. Recommend additional follow-up diagnostic
mammogram and ultrasound in 12 months to ensure 2 year stability.
2. No evidence of malignancy within the LEFT breast.

RECOMMENDATION:
Bilateral diagnostic mammogram, and RIGHT breast ultrasound, in 12
months.

I have discussed the findings and recommendations with the patient.
If applicable, a reminder letter will be sent to the patient
regarding the next appointment.

BI-RADS CATEGORY  3: Probably benign.

## 2022-12-05 DIAGNOSIS — L570 Actinic keratosis: Secondary | ICD-10-CM | POA: Diagnosis not present

## 2022-12-05 DIAGNOSIS — Z85828 Personal history of other malignant neoplasm of skin: Secondary | ICD-10-CM | POA: Diagnosis not present

## 2022-12-05 DIAGNOSIS — L218 Other seborrheic dermatitis: Secondary | ICD-10-CM | POA: Diagnosis not present

## 2022-12-05 DIAGNOSIS — Z872 Personal history of diseases of the skin and subcutaneous tissue: Secondary | ICD-10-CM | POA: Diagnosis not present

## 2022-12-05 DIAGNOSIS — L578 Other skin changes due to chronic exposure to nonionizing radiation: Secondary | ICD-10-CM | POA: Diagnosis not present

## 2022-12-05 DIAGNOSIS — L821 Other seborrheic keratosis: Secondary | ICD-10-CM | POA: Diagnosis not present

## 2022-12-17 ENCOUNTER — Ambulatory Visit (INDEPENDENT_AMBULATORY_CARE_PROVIDER_SITE_OTHER): Payer: Medicare Other

## 2022-12-17 DIAGNOSIS — Z23 Encounter for immunization: Secondary | ICD-10-CM

## 2022-12-17 DIAGNOSIS — E538 Deficiency of other specified B group vitamins: Secondary | ICD-10-CM

## 2022-12-17 MED ORDER — CYANOCOBALAMIN 1000 MCG/ML IJ SOLN
1000.0000 ug | Freq: Once | INTRAMUSCULAR | Status: AC
Start: 2022-12-17 — End: 2022-12-17
  Administered 2022-12-17: 1000 ug via INTRAMUSCULAR

## 2022-12-17 NOTE — Progress Notes (Signed)
Pt presented for their vitamin B12 injection. Pt was identified through two identifiers. Pt tolerated shot well in their right deltoid.   Pt also wanted the flu shot. Due to pt being 65+ high dose flu was offered. Pt tolerated injection well in the left deltoid.

## 2023-01-17 ENCOUNTER — Ambulatory Visit: Payer: Medicare Other

## 2023-01-17 DIAGNOSIS — E538 Deficiency of other specified B group vitamins: Secondary | ICD-10-CM | POA: Diagnosis not present

## 2023-01-17 MED ORDER — CYANOCOBALAMIN 1000 MCG/ML IJ SOLN
1000.0000 ug | Freq: Once | INTRAMUSCULAR | Status: AC
Start: 2023-01-17 — End: 2023-01-17
  Administered 2023-01-17: 1000 ug via INTRAMUSCULAR

## 2023-01-17 NOTE — Progress Notes (Signed)
Patient presented for B 12 injection to left deltoid, patient voiced no concerns nor showed any signs of distress during injection. 

## 2023-01-28 ENCOUNTER — Ambulatory Visit (INDEPENDENT_AMBULATORY_CARE_PROVIDER_SITE_OTHER): Payer: Medicare Other | Admitting: Family

## 2023-01-28 ENCOUNTER — Encounter: Payer: Self-pay | Admitting: Family

## 2023-01-28 VITALS — BP 128/70 | HR 65 | Temp 98.2°F | Ht 69.0 in | Wt 204.0 lb

## 2023-01-28 DIAGNOSIS — J40 Bronchitis, not specified as acute or chronic: Secondary | ICD-10-CM

## 2023-01-28 MED ORDER — AZITHROMYCIN 250 MG PO TABS
ORAL_TABLET | ORAL | 0 refills | Status: AC
Start: 2023-01-28 — End: 2023-02-02

## 2023-01-28 MED ORDER — GUAIFENESIN-CODEINE 100-10 MG/5ML PO SOLN
5.0000 mL | Freq: Every evening | ORAL | 0 refills | Status: DC | PRN
Start: 2023-01-28 — End: 2023-05-23

## 2023-01-28 NOTE — Progress Notes (Unsigned)
Assessment & Plan:  There are no diagnoses linked to this encounter.   Return precautions given.   Risks, benefits, and alternatives of the medications and treatment plan prescribed today were discussed, and patient expressed understanding.   Education regarding symptom management and diagnosis given to patient on AVS either electronically or printed.  No follow-ups on file.  Rennie Plowman, FNP  Subjective:    Patient ID: Jillian [name], female    DOB: June 17, 1950, 72 y.o.   MRN: [id number]  CC: Jillian [name] is a 72 y.o. female who presents today for an acute visit.    HPI: Complains of sinus congestion, episodic dry cough  She had BRB in nasal congestion this morning  No sob, wheezing, leg swelling   She has used cloricidine.    History of GERD, allergic rhinitis, CKD  Compliant with Flonase  Remote history of smoking  BP at home 120/80.    Allergies: Doxycycline, Gabapentin, Sulfa drugs cross reactors, and Tetanus toxoids Current Outpatient Medications on File Prior to Visit  Medication Sig Dispense Refill   amLODipine (NORVASC) 2.5 MG tablet TAKE ONE TABLET BY MOUTH DAILY 90 tablet 3   aspirin 81 MG tablet Take 81 mg by mouth daily.     cetirizine (ZYRTEC) 10 MG tablet Take 10 mg by mouth at bedtime.     Cholecalciferol (VITAMIN D3) 1000 UNITS CAPS Take 1,000 Units by mouth daily.     estradiol (ESTRACE VAGINAL) 0.1 MG/GM vaginal cream Place 0.5 g vaginally 3 (three) times a week. 42.5 g 2   famotidine (PEPCID) 10 MG tablet Take 10 mg by mouth daily. As needed     Fluocinolone Acetonide 0.01 % OIL Apply 1 application topically See admin instructions. Apply to affected ear daily as needed for dermatisis     fluticasone (FLONASE) 50 MCG/ACT nasal spray PLACE 2 SPRAYS INTO THE NOSE DAILY. 16 g 4   NON FORMULARY Apply 1 application topically daily as needed (for dermatisis on face). Hydrocortisone 2.5%/Ketoconazole 2% Cream     nystatin  (MYCOSTATIN/NYSTOP) powder Apply topically 2 (two) times daily. To rash until resolved. 15 g 0   Psyllium (METAMUCIL FIBER PO) Take by mouth. Take 1 tablet daily.     simvastatin (ZOCOR) 40 MG tablet TAKE ONE TABLET BY MOUTH EVERY NIGHT AT BEDTIME 90 tablet 3   telmisartan (MICARDIS) 80 MG tablet Take 1 tablet (80 mg total) by mouth every evening. 90 tablet 1   traZODone (DESYREL) 50 MG tablet Take 0.5-1 tablets (25-50 mg total) by mouth at bedtime as needed for sleep. 90 tablet 3   glycerin adult 2 g suppository Place 1 suppository rectally as needed for constipation. (Patient not taking: Reported on 01/28/2023) 12 suppository 0   ondansetron (ZOFRAN) 4 MG tablet Take 1 tablet (4 mg total) by mouth every 8 (eight) hours as needed for nausea or vomiting. (Patient not taking: Reported on 01/28/2023) 20 tablet 0   No current facility-administered medications on file prior to visit.    Review of Systems    Objective:    BP (!) 142/86   Pulse 65   Temp 98.2 F (36.8 C) (Oral)   Ht 5\' 9"  (1.753 m)   Wt 204 lb (92.5 kg)   SpO2 97%   BMI 30.13 kg/m   BP Readings from Last 3 Encounters:  01/28/23 (!) 142/86  11/23/22 120/80  11/15/22 133/75   Wt Readings from Last 3 Encounters:  01/28/23 204 lb (92.5 kg)  11/23/22 200  lb 9.6 oz (91 kg)  11/15/22 198 lb 3.2 oz (89.9 kg)    Physical Exam

## 2023-01-29 NOTE — Patient Instructions (Signed)
Start Augmentin and probiotics as discussed.  I provided you with codeine-based cough syrup.Please take cough medication at night only as needed. As we discussed, I do not recommend dosing throughout the day as coughing is a protective mechanism . It also helps to break up thick mucous.  Do not take cough suppressants with alcohol as can lead to trouble breathing. Advise caution if taking cough suppressant and operating machinery ( i.e driving a car) as you may feel very tired.

## 2023-01-29 NOTE — Assessment & Plan Note (Signed)
Afebrile and nontoxic in appearance.  Based on duration of symptoms, concern for bacterial URI.  Start Augmentin.  Provided patient with Cheratussin cough syrup and counseled on sedation.  Advised probiotics.  She will let me know how she is doing

## 2023-02-04 ENCOUNTER — Telehealth: Payer: Self-pay | Admitting: Internal Medicine

## 2023-02-04 NOTE — Telephone Encounter (Signed)
Copied from CRM [id number]. Topic: Medicare AWV >> Feb 04, 2023  2:54 PM Payton Doughty wrote: Reason for CRM: LVM 02/04/23 to r/s AWVS due to [facility] sched change. [facility] New AWV date 03/08/2023 at 10:10am. Please confirm [facility]  Verlee Rossetti; Care Guide Ambulatory Clinical Support [place] [facility] Direct Dial: [phone]

## 2023-02-18 ENCOUNTER — Ambulatory Visit (INDEPENDENT_AMBULATORY_CARE_PROVIDER_SITE_OTHER): Payer: Medicare Other

## 2023-02-18 DIAGNOSIS — E538 Deficiency of other specified B group vitamins: Secondary | ICD-10-CM | POA: Diagnosis not present

## 2023-02-18 MED ORDER — CYANOCOBALAMIN 1000 MCG/ML IJ SOLN
1000.0000 ug | Freq: Once | INTRAMUSCULAR | Status: AC
Start: 2023-02-18 — End: 2023-02-18
  Administered 2023-02-18: 1000 ug via INTRAMUSCULAR

## 2023-02-18 NOTE — Progress Notes (Signed)
Pt presented for their vitamin B12 injection. Pt was identified through two identifiers. Pt tolerated shot well in their right deltoid.  

## 2023-03-08 ENCOUNTER — Ambulatory Visit (INDEPENDENT_AMBULATORY_CARE_PROVIDER_SITE_OTHER): Payer: Medicare Other | Admitting: *Deleted

## 2023-03-08 VITALS — Ht 66.5 in | Wt 200.0 lb

## 2023-03-08 DIAGNOSIS — Z0000 Encounter for general adult medical examination without abnormal findings: Secondary | ICD-10-CM | POA: Diagnosis not present

## 2023-03-08 NOTE — Progress Notes (Signed)
 Subjective:   Jillian [name] is a 73 y.o. female who presents for Medicare Annual (Subsequent) preventive examination.  Visit Complete: Virtual I connected with  Jillian [name] on 03/08/23 by a audio enabled telemedicine application and verified that I am speaking with the correct person using two identifiers. Interactive audio and video telecommunications were attempted between this provider and patient, however failed, due to patient having technical difficulties OR patient did not have access to video capability.  We continued and completed visit with audio only.   Patient Location: Home  Provider Location: Home Office  I discussed the limitations of evaluation and management by telemedicine. The patient expressed understanding and agreed to proceed.  Vital Signs: Because this visit was a virtual/telehealth visit, some criteria may be missing or patient reported. Any vitals not documented were not able to be obtained and vitals that have been documented are patient reported.  Patient Medicare AWV questionnaire was completed by the patient on 03/04/23; I have confirmed that all information answered by patient is correct and no changes since this date.  Cardiac Risk Factors include: advanced age (>32men, >1 women);dyslipidemia;hypertension;obesity (BMI >30kg/m2)     Objective:    Today's Vitals   03/08/23 1020  Weight: 200 lb (90.7 kg)  Height: 5' 6.5 (1.689 m)   Body mass index is 31.8 kg/m.     03/08/2023   10:30 AM 11/15/2022   10:22 AM 09/04/2022    3:44 PM 03/02/2022   10:12 AM 03/01/2021    9:09 AM 02/29/2020    9:17 AM 09/25/2019    3:59 PM  Advanced Directives  Does Patient Have a Medical Advance Directive? Yes Yes No Yes Yes Yes No  Type of Estate Agent of [place];Living will Living will  Healthcare Power of [place];Living will Healthcare Power of Attorney Living will   Does patient want to make changes to medical advance directive?     No - Patient declined No - Patient declined No - Patient declined   Copy of Healthcare Power of Attorney in Chart? No - copy requested   No - copy requested No - copy requested    Would patient like information on creating a medical advance directive?   No - Patient declined        Current Medications (verified) Outpatient Encounter Medications as of 03/08/2023  Medication Sig   amLODipine  (NORVASC ) 2.5 MG tablet TAKE ONE TABLET BY MOUTH DAILY   aspirin 81 MG tablet Take 81 mg by mouth daily.   cetirizine (ZYRTEC) 10 MG tablet Take 10 mg by mouth at bedtime.   Cholecalciferol (VITAMIN D3) 1000 UNITS CAPS Take 1,000 Units by mouth daily.   estradiol  (ESTRACE  VAGINAL) 0.1 MG/GM vaginal cream Place 0.5 g vaginally 3 (three) times a week.   famotidine (PEPCID) 10 MG tablet Take 10 mg by mouth daily. As needed   Fluocinolone Acetonide 0.01 % OIL Apply 1 application topically See admin instructions. Apply to affected ear daily as needed for dermatisis   fluticasone  (FLONASE ) 50 MCG/ACT nasal spray PLACE 2 SPRAYS INTO THE NOSE DAILY.   glycerin  adult 2 g suppository Place 1 suppository rectally as needed for constipation.   guaiFENesin -codeine  100-10 MG/5ML syrup Take 5 mLs by mouth at bedtime as needed for cough.   NON FORMULARY Apply 1 application topically daily as needed (for dermatisis on face). Hydrocortisone 2.5%/Ketoconazole 2% Cream   nystatin  (MYCOSTATIN /NYSTOP ) powder Apply topically 2 (two) times daily. To rash until resolved.   ondansetron  (ZOFRAN )  4 MG tablet Take 1 tablet (4 mg total) by mouth every 8 (eight) hours as needed for nausea or vomiting.   Psyllium (METAMUCIL FIBER PO) Take by mouth. Take 1 tablet daily.   simvastatin  (ZOCOR ) 40 MG tablet TAKE ONE TABLET BY MOUTH EVERY NIGHT AT BEDTIME   telmisartan  (MICARDIS ) 80 MG tablet Take 1 tablet (80 mg total) by mouth every evening.   traZODone  (DESYREL ) 50 MG tablet Take 0.5-1 tablets (25-50 mg total) by mouth at bedtime as needed  for sleep.   No facility-administered encounter medications on file as of 03/08/2023.    Allergies (verified) Doxycycline , Gabapentin , Sulfa drugs cross reactors, and Tetanus toxoids   History: Past Medical History:  Diagnosis Date   Allergy    seasonal rhinitis   Arthritis    right shoulder,  no intervention   Cancer (HCC)    basal cell    Chronic kidney disease    Chronic right maxillary sinusitis 08/13/2018   GERD (gastroesophageal reflux disease)    Hyperlipidemia    Hypertension    Irritable bowel syndrome (IBS) 11/2009   last colonoscopy showed diverticulum, no polyps   Neuromuscular disorder (HCC)    Neuropathy    FEET   Pneumonia    Thrombophlebitis of left lower extremity (HCC) 09/29/2019   Vertigo    OCCAS   Past Surgical History:  Procedure Laterality Date   APPENDECTOMY     BREAST BIOPSY Right 03/12/2019   us  bx neg   CATARACT EXTRACTION W/PHACO Left 04/09/2017   Procedure: CATARACT EXTRACTION PHACO AND INTRAOCULAR LENS PLACEMENT (IOC);  Surgeon: Jaye Fallow, MD;  Location: [facility] ORS;  Service: Ophthalmology;  Laterality: Left;  US  00:29.6 AP% 18.4 CDE 5.44 FLUID PACK LOT # [id number]   CATARACT EXTRACTION W/PHACO Right 04/30/2017   Procedure: CATARACT EXTRACTION PHACO AND INTRAOCULAR LENS PLACEMENT (IOC);  Surgeon: Jaye Fallow, MD;  Location: [facility] ORS;  Service: Ophthalmology;  Laterality: Right;  US  00:43.1 AP% 13.7 CDE 5.91 Fluid Pack Lot # [id number]   COLONOSCOPY     COLONOSCOPY WITH PROPOFOL  N/A 11/15/2022   Procedure: COLONOSCOPY WITH PROPOFOL ;  Surgeon: Jinny Carmine, MD;  Location: [facility] ENDOSCOPY;  Service: Endoscopy;  Laterality: N/A;   EYE SURGERY     POLYPECTOMY  11/15/2022   Procedure: POLYPECTOMY;  Surgeon: Jinny Carmine, MD;  Location: [facility] ENDOSCOPY;  Service: Endoscopy;;   right breast bx  03/12/2019   TONSILLECTOMY AND ADENOIDECTOMY  1956   TUBAL LIGATION  1977   VEIN SURGERY     Family History  Problem Relation Age of Onset    Hyperlipidemia Mother    Anxiety disorder Mother    Depression Mother    Heart disease Mother    Hyperlipidemia Father    Hypertension Father    Heart disease Father    Breast cancer Sister 26   Anxiety disorder Sister    Cancer Sister    Depression Sister    Cancer Sister        'blood cancer', dx 20   Hyperlipidemia Maternal Grandmother    Stroke Maternal Grandmother    Hyperlipidemia Maternal Grandfather    Cancer Cousin    Social History   Socioeconomic History   Marital status: Divorced    Spouse name: Not on file   Number of children: Not on file   Years of education: Not on file   Highest education level: GED or equivalent  Occupational History   Occupation: cutsodial    Employer: [employer]    Comment: works  at [employer]  Tobacco Use   Smoking status: Former    Current packs/day: 0.00    Average packs/day: 1.5 packs/day for 45.0 years (67.5 ttl pk-yrs)    Types: Cigarettes    Quit date: 03/16/1999    Years since quitting: 23.9   Smokeless tobacco: Never   Tobacco comments:    Quit 24 years ago  Vaping Use   Vaping status: Never Used  Substance and Sexual Activity   Alcohol use: No   Drug use: No   Sexual activity: Not Currently  Other Topics Concern   Not on file  Social History Narrative   Lives in [place]   Social Drivers of Health   Financial Resource Strain: Low Risk  (03/08/2023)   Overall Financial Resource Strain (CARDIA)    Difficulty of Paying Living Expenses: Not hard at all  Food Insecurity: No Food Insecurity (03/08/2023)   Hunger Vital Sign    Worried About Running Out of Food in the Last Year: Never true    Ran Out of Food in the Last Year: Never true  Transportation Needs: No Transportation Needs (03/08/2023)   PRAPARE - Administrator, Civil Service (Medical): No    Lack of Transportation (Non-Medical): No  Physical Activity: Insufficiently Active (03/08/2023)   Exercise Vital Sign    Days of Exercise per Week: 4  days    Minutes of Exercise per Session: 30 min  Stress: No Stress Concern Present (03/08/2023)   [employer] of Occupational Health - Occupational Stress Questionnaire    Feeling of Stress : Not at all  Social Connections: Moderately Isolated (03/08/2023)   Social Connection and Isolation Panel [NHANES]    Frequency of Communication with Friends and Family: More than three times a week    Frequency of Social Gatherings with Friends and Family: More than three times a week    Attends Religious Services: Never    Database Administrator or Organizations: Yes    Attends Engineer, Structural: More than 4 times per year    Marital Status: Divorced    Tobacco Counseling Counseling given: Not Answered Tobacco comments: Quit 24 years ago   Clinical Intake:  Pre-visit preparation completed: Yes  Pain : No/denies pain     BMI - recorded: 31.8 Nutritional Status: BMI > 30  Obese Nutritional Risks: None Diabetes: No  How often do you need to have someone help you when you read instructions, pamphlets, or other written materials from your doctor or pharmacy?: 1 - Never  Interpreter Needed?: No  Information entered by :: R. Turner Baillie LPN   Activities of Daily Living    03/08/2023   10:22 AM 03/04/2023    1:47 PM  In your present state of health, do you have any difficulty performing the following activities:  Hearing? 0 0  Vision? 0 0  Difficulty concentrating or making decisions? 0 0  Walking or climbing stairs? 0 0  Dressing or bathing? 0 0  Doing errands, shopping? 0 0  Preparing Food and eating ? N N  Using the Toilet? N N  In the past six months, have you accidently leaked urine? N N  Do you have problems with loss of bowel control? N N  Managing your Medications? N N  Managing your Finances? N N  Housekeeping or managing your Housekeeping? N N    Patient Care Team: Marylynn Verneita CROME, MD as PCP - General (Internal Medicine)  Indicate any recent Medical  Services you may have received from other than [facility] providers in the past year (date may be approximate).     Assessment:   This is a routine wellness examination for [facility].  Hearing/Vision screen Hearing Screening - Comments:: No issues Vision Screening - Comments:: glasses   Goals Addressed             This Visit's Progress    Patient Stated       Want to continue to try and lose some weight       Depression Screen    03/08/2023   10:25 AM 11/23/2022    9:31 AM 09/07/2022   10:37 AM 07/19/2022    1:53 PM 05/14/2022   10:00 AM 04/05/2022   12:13 PM 03/02/2022   10:07 AM  PHQ 2/9 Scores  PHQ - 2 Score 0 0 0 0 0 1 0  PHQ- 9 Score 2          Fall Risk    03/08/2023   10:22 AM 03/04/2023    1:47 PM 11/23/2022    9:31 AM 09/07/2022   10:37 AM 07/19/2022    1:53 PM  Fall Risk   Falls in the past year? 0 0 0 0 0  Number falls in past yr: 0 0 0 0 0  Injury with Fall? 0 0 0 0 0  Risk for fall due to : No Fall Risks  No Fall Risks No Fall Risks No Fall Risks  Follow up Falls prevention discussed;Falls evaluation completed  Falls evaluation completed Falls evaluation completed Falls evaluation completed    MEDICARE RISK AT HOME: Medicare Risk at Home Any stairs in or around the home?: (Patient-Rptd) Yes If so, are there any without handrails?: (Patient-Rptd) No Home free of loose throw rugs in walkways, pet beds, electrical cords, etc?: (Patient-Rptd) No Adequate lighting in your home to reduce risk of falls?: (Patient-Rptd) Yes Life alert?: (Patient-Rptd) No Use of a cane, walker or w/c?: (Patient-Rptd) No Grab bars in the bathroom?: (Patient-Rptd) Yes Shower chair or bench in shower?: (Patient-Rptd) No Elevated toilet seat or a handicapped toilet?: (Patient-Rptd) Yes    Cognitive Function:    02/14/2017    3:36 PM 02/07/2016    8:29 AM  MMSE - Mini Mental State Exam  Orientation to time 5 5  Orientation to Place 5 5  Registration 3 3  Attention/ Calculation 5  5  Recall 3 3  Language- name 2 objects 2 2  Language- repeat 1 1  Language- follow 3 step command 3 3  Language- read & follow direction 1 1  Write a sentence 1 1  Copy design 1 1  Total score 30 30        03/08/2023   10:30 AM 03/02/2022   10:12 AM 02/29/2020    9:24 AM 02/25/2019    9:17 AM 02/21/2018   10:47 AM  6CIT Screen  What Year? 0 points 0 points 0 points 0 points 0 points  What month? 0 points 0 points 0 points 0 points 0 points  What time? 0 points 0 points 0 points 0 points 0 points  Count back from 20 0 points  0 points 0 points 0 points  Months in reverse 0 points 0 points 0 points 0 points 0 points  Repeat phrase 0 points   0 points 0 points  Total Score 0 points   0 points 0 points    Immunizations Immunization History  Administered Date(s) Administered   Fluad Quad(high  Dose 65+) 11/24/2018, 12/01/2019   Fluad Trivalent(High Dose 65+) 12/17/2022   Hep A / Hep B 01/09/2017, 02/14/2017, 07/09/2017   Influenza Split 11/28/2020   Influenza, High Dose Seasonal PF 11/29/2014, 12/27/2015, 11/20/2016   Influenza,inj,Quad PF,6+ Mos 12/06/2021   Influenza,inj,quad, With Preservative 12/06/2017   Influenza-Unspecified 11/18/2011, 11/10/2012, 11/25/2013, 12/04/2017   Moderna Sars-Covid-2 Vaccination 04/07/2019, 05/05/2019, 12/26/2019, 12/12/2020   PNEUMOCOCCAL CONJUGATE-20 10/23/2021   Pneumococcal Conjugate-13 11/29/2014   Pneumococcal Polysaccharide-23 02/29/2016   Zoster Recombinant(Shingrix) 09/22/2018, 12/01/2018, 12/21/2018   TDAP status allergic to vaccine    Flu Vaccine status: Up to date  Pneumococcal vaccine status: Up to date  Covid-19 vaccine status: Information provided on how to obtain vaccines.   Qualifies for Shingles Vaccine? Yes   Zostavax completed No   Shingrix Completed?: Yes  Screening Tests Health Maintenance  Topic Date Due   DTaP/Tdap/Td (1 - Tdap) Never done   COVID-19 Vaccine (5 - 2024-25 season) 10/28/2022   Medicare Annual  Wellness (AWV)  03/03/2023   MAMMOGRAM  05/02/2023   Fecal DNA (Cologuard)  07/15/2025   Pneumonia Vaccine 39+ Years old  Completed   INFLUENZA VACCINE  Completed   DEXA SCAN  Completed   Hepatitis C Screening  Completed   Zoster Vaccines- Shingrix  Completed   HPV VACCINES  Aged Out   Colonoscopy  Discontinued    Health Maintenance  Health Maintenance Due  Topic Date Due   DTaP/Tdap/Td (1 - Tdap) Never done   COVID-19 Vaccine (5 - 2024-25 season) 10/28/2022   Medicare Annual Wellness (AWV)  03/03/2023    Colorectal cancer screening: Type of screening: Colonoscopy. Completed 10/2022. Repeat every 10 years Patient stated that the GI doctor told her she did not need another one   Mammogram status: Completed 04/2022. Repeat every year  Bone Density status: Completed 2. Results reflect: Bone density results: OSTEOPENIA. Repeat every 2 years.  Lung Cancer Screening: (Low Dose CT Chest recommended if Age 13-80 years, 20 pack-year currently smoking OR have quit w/in 15years.) does not qualify.     Additional Screening:  Hepatitis C Screening: does qualify; Completed 11/2014  Vision Screening: Recommended annual ophthalmology exams for early detection of glaucoma and other disorders of the eye. Is the patient up to date with their annual eye exam?  Yes  Who is the provider or what is the name of the office in which the patient attends annual eye exams? [facility] If pt is not established with a provider, would they like to be referred to a provider to establish care? No .   Dental Screening: Recommended annual dental exams for proper oral hygiene    Community Resource Referral / Chronic Care Management: CRR required this visit?  No   CCM required this visit?  No     Plan:     I have personally reviewed and noted the following in the patient's chart:   Medical and social history Use of alcohol, tobacco or illicit drugs  Current medications and supplements  including opioid prescriptions. Patient is not currently taking opioid prescriptions. Functional ability and status Nutritional status Physical activity Advanced directives List of other physicians Hospitalizations, surgeries, and ER visits in previous 12 months Vitals Screenings to include cognitive, depression, and falls Referrals and appointments  In addition, I have reviewed and discussed with patient certain preventive protocols, quality metrics, and best practice recommendations. A written personalized care plan for preventive services as well as general preventive health recommendations were provided to patient.  Angeline Fredericks, LPN   [id number]/7974   After Visit Summary: (MyChart) Due to this being a telephonic visit, the after visit summary with patients personalized plan was offered to patient via MyChart   Nurse Notes: None

## 2023-03-08 NOTE — Patient Instructions (Signed)
 Jillian [name] , Thank you for taking time to come for your Medicare Wellness Visit. I appreciate your ongoing commitment to your health goals. Please review the following plan we discussed and let me know if I can assist you in the future.   Referrals/Orders/Follow-Ups/Clinician Recommendations: None  This is a list of the screening recommended for you and due dates:  Health Maintenance  Topic Date Due   DTaP/Tdap/Td vaccine (1 - Tdap) Never done   COVID-19 Vaccine (5 - 2024-25 season) 10/28/2022   Mammogram  05/02/2023   Medicare Annual Wellness Visit  03/07/2024   Cologuard (Stool DNA test)  07/15/2025   Pneumonia Vaccine  Completed   Flu Shot  Completed   DEXA scan (bone density measurement)  Completed   Hepatitis C Screening  Completed   Zoster (Shingles) Vaccine  Completed   HPV Vaccine  Aged Out   Colon Cancer Screening  Discontinued    Advanced directives: (Copy Requested) Please bring a copy of your health care power of attorney and living will to the office to be added to your chart at your convenience.  Next Medicare Annual Wellness Visit scheduled for next year: Yes 03/10/24 @ 10:10

## 2023-03-21 ENCOUNTER — Other Ambulatory Visit: Payer: Self-pay | Admitting: Internal Medicine

## 2023-03-25 ENCOUNTER — Ambulatory Visit (INDEPENDENT_AMBULATORY_CARE_PROVIDER_SITE_OTHER): Payer: Medicare Other

## 2023-03-25 DIAGNOSIS — E538 Deficiency of other specified B group vitamins: Secondary | ICD-10-CM | POA: Diagnosis not present

## 2023-03-25 MED ORDER — CYANOCOBALAMIN 1000 MCG/ML IJ SOLN
1000.0000 ug | Freq: Once | INTRAMUSCULAR | Status: AC
Start: 2023-03-25 — End: 2023-03-25
  Administered 2023-03-25: 1000 ug via INTRAMUSCULAR

## 2023-03-25 NOTE — Progress Notes (Signed)
Pt presented for their vitamin B12 injection. Pt was identified through two identifiers. Pt tolerated shot well in their left deltoid.

## 2023-04-29 ENCOUNTER — Ambulatory Visit (INDEPENDENT_AMBULATORY_CARE_PROVIDER_SITE_OTHER): Payer: Medicare Other

## 2023-04-29 DIAGNOSIS — I10 Essential (primary) hypertension: Secondary | ICD-10-CM | POA: Diagnosis not present

## 2023-04-29 DIAGNOSIS — E538 Deficiency of other specified B group vitamins: Secondary | ICD-10-CM | POA: Diagnosis not present

## 2023-04-29 DIAGNOSIS — I129 Hypertensive chronic kidney disease with stage 1 through stage 4 chronic kidney disease, or unspecified chronic kidney disease: Secondary | ICD-10-CM | POA: Diagnosis not present

## 2023-04-29 DIAGNOSIS — N1831 Chronic kidney disease, stage 3a: Secondary | ICD-10-CM | POA: Diagnosis not present

## 2023-04-29 MED ORDER — CYANOCOBALAMIN 1000 MCG/ML IJ SOLN
1000.0000 ug | Freq: Once | INTRAMUSCULAR | Status: AC
Start: 2023-04-29 — End: 2023-04-29
  Administered 2023-04-29: 1000 ug via INTRAMUSCULAR

## 2023-04-29 NOTE — Progress Notes (Signed)
 Pt presented for their vitamin B12 injection. Pt was identified through two identifiers. Pt tolerated shot well in their right deltoid.

## 2023-04-30 ENCOUNTER — Telehealth: Payer: Self-pay

## 2023-04-30 NOTE — Telephone Encounter (Signed)
 FYI

## 2023-04-30 NOTE — Telephone Encounter (Signed)
 Copied from CRM [id number]. Topic: Clinical - Home Health Verbal Orders >> Apr 30, 2023  1:36 PM Sonny Dandy B wrote: Caller/Agency: Monica from house calls Callback Number: [phone] Service Requested: Skilled Nursing Frequency:  Any new concerns about the patient?  Called to report pt's Quantaflo pad, Right side is mild 0.87,  left side is 0.95 borderline, pt is a symptomatic, called to report the finding.

## 2023-05-03 ENCOUNTER — Ambulatory Visit
Admission: RE | Admit: 2023-05-03 | Discharge: 2023-05-03 | Disposition: A | Discharge status: Home / Self Care | Payer: Medicare Other | Source: Ambulatory Visit | Attending: Internal Medicine | Admitting: Internal Medicine

## 2023-05-03 DIAGNOSIS — Z1231 Encounter for screening mammogram for malignant neoplasm of breast: Secondary | ICD-10-CM | POA: Insufficient documentation

## 2023-05-06 DIAGNOSIS — E790 Hyperuricemia without signs of inflammatory arthritis and tophaceous disease: Secondary | ICD-10-CM | POA: Diagnosis not present

## 2023-05-06 DIAGNOSIS — I129 Hypertensive chronic kidney disease with stage 1 through stage 4 chronic kidney disease, or unspecified chronic kidney disease: Secondary | ICD-10-CM | POA: Diagnosis not present

## 2023-05-06 DIAGNOSIS — N1831 Chronic kidney disease, stage 3a: Secondary | ICD-10-CM | POA: Diagnosis not present

## 2023-05-06 DIAGNOSIS — N2581 Secondary hyperparathyroidism of renal origin: Secondary | ICD-10-CM | POA: Diagnosis not present

## 2023-05-23 ENCOUNTER — Ambulatory Visit: Payer: Medicare Other | Admitting: Internal Medicine

## 2023-05-23 ENCOUNTER — Encounter: Payer: Self-pay | Admitting: Internal Medicine

## 2023-05-23 VITALS — BP 130/80 | HR 62 | Ht 66.5 in | Wt 203.6 lb

## 2023-05-23 DIAGNOSIS — I700 Atherosclerosis of aorta: Secondary | ICD-10-CM

## 2023-05-23 DIAGNOSIS — I10 Essential (primary) hypertension: Secondary | ICD-10-CM

## 2023-05-23 DIAGNOSIS — K760 Fatty (change of) liver, not elsewhere classified: Secondary | ICD-10-CM | POA: Diagnosis not present

## 2023-05-23 DIAGNOSIS — R7303 Prediabetes: Secondary | ICD-10-CM

## 2023-05-23 DIAGNOSIS — E7801 Familial hypercholesterolemia: Secondary | ICD-10-CM | POA: Diagnosis not present

## 2023-05-23 DIAGNOSIS — N183 Chronic kidney disease, stage 3 unspecified: Secondary | ICD-10-CM | POA: Diagnosis not present

## 2023-05-23 LAB — HEPATIC FUNCTION PANEL
ALT: 14 U/L (ref 0–35)
AST: 17 U/L (ref 0–37)
Albumin: 4.5 g/dL (ref 3.5–5.2)
Alkaline Phosphatase: 72 U/L (ref 39–117)
Bilirubin, Direct: 0.1 mg/dL (ref 0.0–0.3)
Total Bilirubin: 0.8 mg/dL (ref 0.2–1.2)
Total Protein: 7 g/dL (ref 6.0–8.3)

## 2023-05-23 MED ORDER — TRAZODONE HCL 50 MG PO TABS: 25.0000 mg | ORAL_TABLET | Freq: Every evening | ORAL | 3 refills | Status: AC | PRN

## 2023-05-23 MED ORDER — TELMISARTAN 80 MG PO TABS
80.0000 mg | ORAL_TABLET | Freq: Every evening | ORAL | 1 refills | Status: DC
Start: 2023-05-23 — End: 2023-11-25

## 2023-05-23 NOTE — Patient Instructions (Addendum)
 You are doing well!  No changes were advised today  See you in 6 months

## 2023-05-23 NOTE — Progress Notes (Unsigned)
 Subjective:  Patient ID: Jillian [name], female    DOB: 07-25-1950  Age: 73 y.o. MRN: [id number]  CC: The primary encounter diagnosis was Primary hypertension. Diagnoses of Familial hypercholesterolemia, Stage 3 chronic kidney disease, unspecified whether stage 3a or 3b CKD (HCC), Prediabetes, Hepatic steatosis, and Atherosclerosis of aorta (HCC) were also pertinent to this visit.   HPI BEULAH CAPOBIANCO presents for  Chief Complaint  Patient presents with   Medical Management of Chronic Issues    6 month follow up    1) HTN:  home readings  have ranged from 120/74   to 130/80 on amlodipine  2) CKD:  seeing Thedore Mins ,  reviewed last months labs. She is avoiding NSAIDs   2) allergic rhinitis : using zyrtec flonase,  and azelastine     3) walking for exercise.  ,  sleeping well with trazodone    Outpatient Medications Prior to Visit  Medication Sig Dispense Refill   amLODipine (NORVASC) 2.5 MG tablet TAKE 1 TABLET BY MOUTH DAILY 90 tablet 3   aspirin 81 MG tablet Take 81 mg by mouth daily.     cetirizine (ZYRTEC) 10 MG tablet Take 10 mg by mouth at bedtime.     Cholecalciferol (VITAMIN D3) 1000 UNITS CAPS Take 1,000 Units by mouth daily.     estradiol (ESTRACE VAGINAL) 0.1 MG/GM vaginal cream Place 0.5 g vaginally 3 (three) times a week. 42.5 g 2   famotidine (PEPCID) 10 MG tablet Take 10 mg by mouth daily. As needed     Fluocinolone Acetonide 0.01 % OIL Apply 1 application topically See admin instructions. Apply to affected ear daily as needed for dermatisis     fluticasone (FLONASE) 50 MCG/ACT nasal spray PLACE 2 SPRAYS INTO THE NOSE DAILY. 16 g 4   glycerin adult 2 g suppository Place 1 suppository rectally as needed for constipation. 12 suppository 0   NON FORMULARY Apply 1 application topically daily as needed (for dermatisis on face). Hydrocortisone 2.5%/Ketoconazole 2% Cream     nystatin (MYCOSTATIN/NYSTOP) powder Apply topically 2 (two) times daily. To rash until  resolved. 15 g 0   Psyllium (METAMUCIL FIBER PO) Take by mouth. Take 1 tablet daily.     simvastatin (ZOCOR) 40 MG tablet TAKE 1 TABLET BY MOUTH EVERY NIGHT AT BEDTIME 90 tablet 3   telmisartan (MICARDIS) 80 MG tablet Take 1 tablet (80 mg total) by mouth every evening. 90 tablet 1   traZODone (DESYREL) 50 MG tablet Take 0.5-1 tablets (25-50 mg total) by mouth at bedtime as needed for sleep. 90 tablet 3   guaiFENesin-codeine 100-10 MG/5ML syrup Take 5 mLs by mouth at bedtime as needed for cough. (Patient not taking: Reported on 05/23/2023) 70 mL 0   ondansetron (ZOFRAN) 4 MG tablet Take 1 tablet (4 mg total) by mouth every 8 (eight) hours as needed for nausea or vomiting. (Patient not taking: Reported on 05/23/2023) 20 tablet 0   No facility-administered medications prior to visit.    Review of Systems;  Patient denies headache, fevers, malaise, unintentional weight loss, skin rash, eye pain, sinus congestion and sinus pain, sore throat, dysphagia,  hemoptysis , cough, dyspnea, wheezing, chest pain, palpitations, orthopnea, edema, abdominal pain, nausea, melena, diarrhea, constipation, flank pain, dysuria, hematuria, urinary  Frequency, nocturia, numbness, tingling, seizures,  Focal weakness, Loss of consciousness,  Tremor, insomnia, depression, anxiety, and suicidal ideation.      Objective:  BP 130/80   Pulse 62   Ht 5' 6.5" (1.689 m)  Wt 203 lb 9.6 oz (92.4 kg)   SpO2 98%   BMI 32.37 kg/m   BP Readings from Last 3 Encounters:  05/23/23 130/80  01/28/23 128/70  11/23/22 120/80    Wt Readings from Last 3 Encounters:  05/23/23 203 lb 9.6 oz (92.4 kg)  03/08/23 200 lb (90.7 kg)  01/28/23 204 lb (92.5 kg)    Physical Exam Vitals reviewed.  Constitutional:      General: She is not in acute distress.    Appearance: Normal appearance. She is normal weight. She is not ill-appearing, toxic-appearing or diaphoretic.  HENT:     Head: Normocephalic.  Eyes:     General: No scleral  icterus.       Right eye: No discharge.        Left eye: No discharge.     Conjunctiva/sclera: Conjunctivae normal.  Cardiovascular:     Rate and Rhythm: Normal rate and regular rhythm.     Heart sounds: Normal heart sounds.  Pulmonary:     Effort: Pulmonary effort is normal. No respiratory distress.     Breath sounds: Normal breath sounds.  Musculoskeletal:        General: Normal range of motion.  Skin:    General: Skin is warm and dry.  Neurological:     General: No focal deficit present.     Mental Status: She is alert and oriented to person, place, and time. Mental status is at baseline.  Psychiatric:        Mood and Affect: Mood normal.        Behavior: Behavior normal.        Thought Content: Thought content normal.        Judgment: Judgment normal.     Lab Results  Component Value Date   HGBA1C 5.6 11/23/2022   HGBA1C 5.7 05/14/2022   HGBA1C 5.6 04/21/2021    Lab Results  Component Value Date   CREATININE 1.32 (H) 11/23/2022   CREATININE 1.41 (H) 07/19/2022   CREATININE 1.11 05/14/2022    Lab Results  Component Value Date   WBC 10.4 07/19/2022   HGB 13.3 07/19/2022   HCT 40.7 07/19/2022   PLT 264.0 07/19/2022   GLUCOSE 93 11/23/2022   CHOL 172 11/23/2022   TRIG 168.0 (H) 11/23/2022   HDL 61.50 11/23/2022   LDLDIRECT 81.0 11/23/2022   LDLCALC 77 11/23/2022   ALT 14 05/23/2023   AST 17 05/23/2023   NA 140 11/23/2022   K 4.6 11/23/2022   CL 106 11/23/2022   CREATININE 1.32 (H) 11/23/2022   BUN 23 11/23/2022   CO2 26 11/23/2022   TSH 1.04 11/23/2022   HGBA1C 5.6 11/23/2022   MICROALBUR <0.7 11/23/2022    MM 3D SCREENING MAMMOGRAM BILATERAL BREAST Result Date: 05/08/2023 CLINICAL DATA:  Screening. EXAM: DIGITAL SCREENING BILATERAL MAMMOGRAM WITH TOMOSYNTHESIS AND CAD TECHNIQUE: Bilateral screening digital craniocaudal and mediolateral oblique mammograms were obtained. Bilateral screening digital breast tomosynthesis was performed. The images were  evaluated with computer-aided detection. COMPARISON:  Previous exam(s). ACR Breast Density Category a: The breasts are almost entirely fatty. FINDINGS: There are no findings suspicious for malignancy. IMPRESSION: No mammographic evidence of malignancy. A result letter of this screening mammogram will be mailed directly to the patient. RECOMMENDATION: Screening mammogram in one year. (Code:SM-B-01Y) BI-RADS CATEGORY  1: Negative. Electronically Signed   By: Frederico Hamman M.D.   On: 05/08/2023 11:59    Assessment & Plan:  .Primary hypertension -     Telmisartan; Take 1  tablet (80 mg total) by mouth every evening.  Dispense: 90 tablet; Refill: 1  Familial hypercholesterolemia -     Hepatic function panel  Stage 3 chronic kidney disease, unspecified whether stage 3a or 3b CKD (HCC) Assessment & Plan: Reviewed nephrology evaluation.  GFR is stable.  Reviewed BP readings.  Goal of 120/70 as long as her dizziness is caused by orthostasis . Reviewed list of otc nephrotoxic agents.    Lab Results  Component Value Date   CREATININE 1.32 (H) 11/23/2022      Prediabetes Assessment & Plan: She has lowered her A1c  With a low glycemic index diet and participation  in an aerobic  exercise activity.    Lab Results  Component Value Date   HGBA1C 5.6 11/23/2022        Hepatic steatosis Assessment & Plan: Presumed by ultrasound changes and serologies negative for autoimmune causes of hepatitis.  Current liver enzymes are normal and all modifiable risk factors including obesity and hyperlipidemia have been addressed   Lab Results  Component Value Date   ALT 14 05/23/2023   AST 17 05/23/2023   ALKPHOS 72 05/23/2023   BILITOT 0.8 05/23/2023      Atherosclerosis of aorta (HCC) Assessment & Plan: Patient is tolerating high potency statin therapy  for risk reduction and  stabilization of atherosclerotic placque noted on prior abdominal CT which was reviewed during today's visit  Continue  statin.  LDL is 71    Other orders -     traZODone HCl; Take 0.5-1 tablets (25-50 mg total) by mouth at bedtime as needed for sleep.  Dispense: 90 tablet; Refill: 3    Follow-up: Return in about 6 months (around 11/23/2023) for hypertension.   Sherlene Shams, MD

## 2023-05-24 ENCOUNTER — Encounter: Payer: Self-pay | Admitting: Internal Medicine

## 2023-05-24 NOTE — Assessment & Plan Note (Addendum)
 Reviewed nephrology evaluation.  GFR is stable.  Reviewed BP readings.  Goal of 120/70 as long as her dizziness is caused by orthostasis . Reviewed list of otc nephrotoxic agents.    Lab Results  Component Value Date   CREATININE 1.32 (H) 11/23/2022

## 2023-05-24 NOTE — Assessment & Plan Note (Signed)
 Patient is tolerating high potency statin therapy  for risk reduction and  stabilization of atherosclerotic placque noted on prior abdominal CT which was reviewed during today's visit  Continue statin.  LDL is 71

## 2023-05-24 NOTE — Assessment & Plan Note (Signed)
 She has lowered her A1c  With a low glycemic index diet and participation  in an aerobic  exercise activity.    Lab Results  Component Value Date   HGBA1C 5.6 11/23/2022

## 2023-05-24 NOTE — Assessment & Plan Note (Signed)
 Presumed by ultrasound changes and serologies negative for autoimmune causes of hepatitis.  Current liver enzymes are normal and all modifiable risk factors including obesity and hyperlipidemia have been addressed   Lab Results  Component Value Date   ALT 14 05/23/2023   AST 17 05/23/2023   ALKPHOS 72 05/23/2023   BILITOT 0.8 05/23/2023

## 2023-06-03 ENCOUNTER — Ambulatory Visit (INDEPENDENT_AMBULATORY_CARE_PROVIDER_SITE_OTHER)

## 2023-06-03 DIAGNOSIS — E538 Deficiency of other specified B group vitamins: Secondary | ICD-10-CM

## 2023-06-03 MED ORDER — CYANOCOBALAMIN 1000 MCG/ML IJ SOLN
1000.0000 ug | Freq: Once | INTRAMUSCULAR | Status: AC
  Administered 2023-06-03: 1000 ug via INTRAMUSCULAR

## 2023-06-03 NOTE — Progress Notes (Signed)
 Pt presented for their vitamin B12 injection. Pt was identified through two identifiers. Pt tolerated shot well in their left deltoid.

## 2023-06-17 DIAGNOSIS — Z961 Presence of intraocular lens: Secondary | ICD-10-CM | POA: Diagnosis not present

## 2023-06-17 DIAGNOSIS — H26492 Other secondary cataract, left eye: Secondary | ICD-10-CM | POA: Diagnosis not present

## 2023-06-17 DIAGNOSIS — H43813 Vitreous degeneration, bilateral: Secondary | ICD-10-CM | POA: Diagnosis not present

## 2023-06-17 DIAGNOSIS — D3131 Benign neoplasm of right choroid: Secondary | ICD-10-CM | POA: Diagnosis not present

## 2023-07-08 ENCOUNTER — Ambulatory Visit

## 2023-07-08 DIAGNOSIS — E538 Deficiency of other specified B group vitamins: Secondary | ICD-10-CM

## 2023-07-08 MED ORDER — CYANOCOBALAMIN 1000 MCG/ML IJ SOLN
1000.0000 ug | Freq: Once | INTRAMUSCULAR | Status: AC
Start: 2023-07-08 — End: 2023-07-08
  Administered 2023-07-08: 1000 ug via INTRAMUSCULAR

## 2023-07-08 NOTE — Progress Notes (Signed)
 Pt presented for their vitamin B12 injection. Pt was identified through two identifiers. Pt tolerated shot well in their right deltoid.

## 2023-08-05 ENCOUNTER — Telehealth: Payer: Self-pay | Admitting: Internal Medicine

## 2023-08-05 DIAGNOSIS — R7303 Prediabetes: Secondary | ICD-10-CM

## 2023-08-05 DIAGNOSIS — E538 Deficiency of other specified B group vitamins: Secondary | ICD-10-CM

## 2023-08-05 DIAGNOSIS — E782 Mixed hyperlipidemia: Secondary | ICD-10-CM

## 2023-08-05 DIAGNOSIS — I10 Essential (primary) hypertension: Secondary | ICD-10-CM

## 2023-08-05 NOTE — Telephone Encounter (Signed)
 Patients need lab orders please

## 2023-08-08 ENCOUNTER — Other Ambulatory Visit

## 2023-08-08 ENCOUNTER — Ambulatory Visit

## 2023-08-12 ENCOUNTER — Ambulatory Visit (INDEPENDENT_AMBULATORY_CARE_PROVIDER_SITE_OTHER): Admitting: *Deleted

## 2023-08-12 DIAGNOSIS — E538 Deficiency of other specified B group vitamins: Secondary | ICD-10-CM

## 2023-08-12 MED ORDER — CYANOCOBALAMIN 1000 MCG/ML IJ SOLN
1000.0000 ug | Freq: Once | INTRAMUSCULAR | Status: AC
  Administered 2023-08-12: 1000 ug via INTRAMUSCULAR

## 2023-08-12 NOTE — Progress Notes (Signed)
 Pt received B12 injection in Left  deltoid muscle. Pt tolerated it well with no complaints or concerns.

## 2023-09-11 ENCOUNTER — Ambulatory Visit

## 2023-09-11 DIAGNOSIS — E538 Deficiency of other specified B group vitamins: Secondary | ICD-10-CM | POA: Diagnosis not present

## 2023-09-11 MED ORDER — CYANOCOBALAMIN 1000 MCG/ML IJ SOLN
1000.0000 ug | Freq: Once | INTRAMUSCULAR | Status: AC
  Administered 2023-09-11: 1000 ug via INTRAMUSCULAR

## 2023-09-11 NOTE — Progress Notes (Signed)
 Patient was administered a b12 injection into her right deltoid. Patient tolerated the b12 injection well.

## 2023-10-14 ENCOUNTER — Ambulatory Visit (INDEPENDENT_AMBULATORY_CARE_PROVIDER_SITE_OTHER)

## 2023-10-14 DIAGNOSIS — E538 Deficiency of other specified B group vitamins: Secondary | ICD-10-CM

## 2023-10-14 MED ORDER — CYANOCOBALAMIN 1000 MCG/ML IJ SOLN
1000.0000 ug | Freq: Once | INTRAMUSCULAR | Status: AC
  Administered 2023-10-14: 1000 ug via INTRAMUSCULAR

## 2023-10-14 NOTE — Progress Notes (Signed)
Pt received B12 injection in right deltoid. Pt tolerated it well with no complaints or concerns. 

## 2023-10-31 DIAGNOSIS — N1831 Chronic kidney disease, stage 3a: Secondary | ICD-10-CM | POA: Diagnosis not present

## 2023-10-31 DIAGNOSIS — I129 Hypertensive chronic kidney disease with stage 1 through stage 4 chronic kidney disease, or unspecified chronic kidney disease: Secondary | ICD-10-CM | POA: Diagnosis not present

## 2023-10-31 DIAGNOSIS — N2581 Secondary hyperparathyroidism of renal origin: Secondary | ICD-10-CM | POA: Diagnosis not present

## 2023-11-11 DIAGNOSIS — N2581 Secondary hyperparathyroidism of renal origin: Secondary | ICD-10-CM | POA: Diagnosis not present

## 2023-11-11 DIAGNOSIS — I129 Hypertensive chronic kidney disease with stage 1 through stage 4 chronic kidney disease, or unspecified chronic kidney disease: Secondary | ICD-10-CM | POA: Diagnosis not present

## 2023-11-11 DIAGNOSIS — E790 Hyperuricemia without signs of inflammatory arthritis and tophaceous disease: Secondary | ICD-10-CM | POA: Diagnosis not present

## 2023-11-11 DIAGNOSIS — E875 Hyperkalemia: Secondary | ICD-10-CM | POA: Diagnosis not present

## 2023-11-11 DIAGNOSIS — N1832 Chronic kidney disease, stage 3b: Secondary | ICD-10-CM | POA: Diagnosis not present

## 2023-11-18 ENCOUNTER — Ambulatory Visit (INDEPENDENT_AMBULATORY_CARE_PROVIDER_SITE_OTHER)

## 2023-11-18 DIAGNOSIS — E538 Deficiency of other specified B group vitamins: Secondary | ICD-10-CM

## 2023-11-18 DIAGNOSIS — E875 Hyperkalemia: Secondary | ICD-10-CM | POA: Diagnosis not present

## 2023-11-18 DIAGNOSIS — N1832 Chronic kidney disease, stage 3b: Secondary | ICD-10-CM | POA: Diagnosis not present

## 2023-11-18 MED ORDER — CYANOCOBALAMIN 1000 MCG/ML IJ SOLN
1000.0000 ug | Freq: Once | INTRAMUSCULAR | Status: AC
  Administered 2023-11-18: 1000 ug via INTRAMUSCULAR

## 2023-11-18 NOTE — Progress Notes (Signed)
 Pt received B12 injection in Left  deltoid muscle. Pt tolerated it well with no complaints or concerns.

## 2023-11-19 ENCOUNTER — Ambulatory Visit: Admitting: Nurse Practitioner

## 2023-11-25 ENCOUNTER — Ambulatory Visit: Admitting: Internal Medicine

## 2023-11-25 ENCOUNTER — Encounter: Payer: Self-pay | Admitting: Internal Medicine

## 2023-11-25 ENCOUNTER — Ambulatory Visit: Payer: Self-pay | Admitting: Internal Medicine

## 2023-11-25 VITALS — BP 120/72 | HR 66 | Temp 98.2°F | Ht 65.5 in | Wt 204.6 lb

## 2023-11-25 DIAGNOSIS — I10 Essential (primary) hypertension: Secondary | ICD-10-CM

## 2023-11-25 DIAGNOSIS — I2510 Atherosclerotic heart disease of native coronary artery without angina pectoris: Secondary | ICD-10-CM | POA: Diagnosis not present

## 2023-11-25 DIAGNOSIS — E538 Deficiency of other specified B group vitamins: Secondary | ICD-10-CM

## 2023-11-25 DIAGNOSIS — K760 Fatty (change of) liver, not elsewhere classified: Secondary | ICD-10-CM

## 2023-11-25 DIAGNOSIS — K589 Irritable bowel syndrome without diarrhea: Secondary | ICD-10-CM | POA: Insufficient documentation

## 2023-11-25 DIAGNOSIS — G629 Polyneuropathy, unspecified: Secondary | ICD-10-CM | POA: Diagnosis not present

## 2023-11-25 DIAGNOSIS — K580 Irritable bowel syndrome with diarrhea: Secondary | ICD-10-CM | POA: Diagnosis not present

## 2023-11-25 DIAGNOSIS — N1831 Chronic kidney disease, stage 3a: Secondary | ICD-10-CM

## 2023-11-25 DIAGNOSIS — E782 Mixed hyperlipidemia: Secondary | ICD-10-CM | POA: Diagnosis not present

## 2023-11-25 DIAGNOSIS — Z0000 Encounter for general adult medical examination without abnormal findings: Secondary | ICD-10-CM

## 2023-11-25 DIAGNOSIS — R7303 Prediabetes: Secondary | ICD-10-CM

## 2023-11-25 LAB — LIPID PANEL
Cholesterol: 186 mg/dL (ref 0–200)
HDL: 55.9 mg/dL (ref >39.00)
LDL Cholesterol: 90 mg/dL (ref 0–99)
NonHDL: 130.15
Total CHOL/HDL Ratio: 3
Triglycerides: 199 mg/dL — ABNORMAL HIGH (ref 0.0–149.0)
VLDL: 39.8 mg/dL (ref 0.0–40.0)

## 2023-11-25 LAB — COMPREHENSIVE METABOLIC PANEL WITH GFR
ALT: 12 U/L (ref 0–35)
AST: 15 U/L (ref 0–37)
Albumin: 4.3 g/dL (ref 3.5–5.2)
Alkaline Phosphatase: 64 U/L (ref 39–117)
BUN: 25 mg/dL — ABNORMAL HIGH (ref 6–23)
CO2: 27 meq/L (ref 19–32)
Calcium: 9.5 mg/dL (ref 8.4–10.5)
Chloride: 106 meq/L (ref 96–112)
Creatinine, Ser: 1.43 mg/dL — ABNORMAL HIGH (ref 0.40–1.20)
GFR: 36.18 mL/min — ABNORMAL LOW (ref >60.00)
Glucose, Bld: 91 mg/dL (ref 70–99)
Potassium: 4.4 meq/L (ref 3.5–5.1)
Sodium: 140 meq/L (ref 135–145)
Total Bilirubin: 0.5 mg/dL (ref 0.2–1.2)
Total Protein: 6.8 g/dL (ref 6.0–8.3)

## 2023-11-25 LAB — B12 AND FOLATE PANEL
Folate: 9.7 ng/mL (ref >5.9)
Vitamin B-12: 901 pg/mL (ref 211–911)

## 2023-11-25 LAB — HEMOGLOBIN A1C: Hgb A1c MFr Bld: 6 % (ref 4.6–6.5)

## 2023-11-25 MED ORDER — AZELASTINE HCL 0.1 % NA SOLN: 2.0000 | Freq: Two times a day (BID) | NASAL | 11 refills | Status: AC

## 2023-11-25 MED ORDER — TELMISARTAN 80 MG PO TABS: 80.0000 mg | ORAL_TABLET | Freq: Every evening | ORAL | 1 refills | Status: AC

## 2023-11-25 NOTE — Assessment & Plan Note (Signed)

## 2023-11-25 NOTE — Assessment & Plan Note (Signed)
 She is asymptomatic  taking a statin and asa . Repeat lipids are due  Lab Results  Component Value Date   CHOL 172 11/23/2022   HDL 61.50 11/23/2022   LDLCALC 77 11/23/2022   LDLDIRECT 81.0 11/23/2022   TRIG 168.0 (H) 11/23/2022   CHOLHDL 3 11/23/2022

## 2023-11-25 NOTE — Assessment & Plan Note (Signed)
 Mild,  Hereditary  ,  With B12 deficiency addressed.  Activity limiting.  Gabapentin  trial has failed again   Due to of thoracic back pain reucrrence an elevation of blood pressure.  SHE DEFERS ADDITIONAL THERAPY trials at this time .  Application for disabled parking permit singed.

## 2023-11-25 NOTE — Assessment & Plan Note (Signed)
 Diarrhea predominant,  with intermittent symptoms. Managed with dietary discretion and avoidance of ice cream , uses lactosoe fat free milk and ok with yogurt

## 2023-11-25 NOTE — Assessment & Plan Note (Signed)
 Plasn to sstart a berberine supplement mailed to her by her insurance company

## 2023-11-25 NOTE — Assessment & Plan Note (Addendum)
 Diagnosed in 2018 at time of diagnosis by CT.  She is asymptomatic  taking a statin and asa but LDL is not at  goal . Will recommend change to higher potency staitn   Lab Results  Component Value Date   CHOL 186 11/25/2023   HDL 55.90 11/25/2023   LDLCALC 90 11/25/2023   LDLDIRECT 81.0 11/23/2022   TRIG 199.0 (H) 11/25/2023   CHOLHDL 3 11/25/2023

## 2023-11-25 NOTE — Assessment & Plan Note (Signed)
 Presumed by ultrasound changes and serologies negative for autoimmune causes of hepatitis.  Current liver enzymes are due; they have been normal and all modifiable risk factors including  prediabetes, obesity and hyperlipidemia have been addressed   Lab Results  Component Value Date   ALT 14 05/23/2023   AST 17 05/23/2023   ALKPHOS 72 05/23/2023   BILITOT 0.8 05/23/2023

## 2023-11-25 NOTE — Assessment & Plan Note (Addendum)
 Managed by nephrology evaluation.  GFR is stable.  Reviewed BP readings.  Goal of 120/70 as long as her dizziness is caused by orthostasis   Lab Results  Component Value Date   CREATININE 1.43 (H) 11/25/2023

## 2023-11-25 NOTE — Assessment & Plan Note (Signed)
 She is at goal  of < 130/80  CKD,  which she confirms has been achieved with home readings.  Continue telmisartan  80 mg and amlodipine   2.5 mg daily

## 2023-11-25 NOTE — Progress Notes (Unsigned)
 Patient ID: Jillian [name], female    DOB: 10-Dec-1950  Age: 73 y.o. MRN: [id number]  The patient is here for annual preventive examination and management of other chronic and acute problems.   The risk factors are reflected in the social history.   The roster of all physicians providing medical care to patient - is listed in the Snapshot section of the chart.   Activities of daily living:  The patient is 100% independent in all ADLs: dressing, toileting, feeding as well as independent mobility   Home safety : The patient has smoke detectors in the home. They wear seatbelts.  There are no unsecured firearms at home. There is no violence in the home.    There is no risks for hepatitis, STDs or HIV. There is no   history of blood transfusion. They have no travel history to infectious disease endemic areas of the world.   The patient has seen their dentist in the last six month. They have seen their eye doctor in the last year. The patinet  denies slight hearing difficulty with regard to whispered voices and some television programs.  They have deferred audiologic testing in the last year.  They do not  have excessive sun exposure. Discussed the need for sun protection: hats, long sleeves and use of sunscreen if there is significant sun exposure.    Diet: the importance of a healthy diet is discussed. They do have a healthy diet.   The benefits of regular aerobic exercise were discussed. The patient  walks 3 to 5 days per week  for  60 minutes.    Depression screen: there are no signs or vegative symptoms of depression- irritability, change in appetite, anhedonia, sadness/tearfullness.   The following portions of the patient's history were reviewed and updated as appropriate: allergies, current medications, past family history, past medical history,  past surgical history, past social history  and problem list.   Visual acuity was not assessed per patient preference since the patient has  regular follow up with an  ophthalmologist. Hearing and body mass index were assessed and reviewed.    During the course of the visit the patient was educated and counseled about appropriate screening and preventive services including : fall prevention , diabetes screening, nutrition counseling, colorectal cancer screening, and recommended immunizations.    Chief Complaint:   none.    Review of Symptoms  Patient denies headache, fevers, malaise, unintentional weight loss, skin rash, eye pain, sinus congestion and sinus pain, sore throat, dysphagia,  hemoptysis , cough, dyspnea, wheezing, chest pain, palpitations, orthopnea, edema, abdominal pain, nausea, melena, diarrhea, constipation, flank pain, dysuria, hematuria, urinary  Frequency, nocturia, numbness, tingling, seizures,  Focal weakness, Loss of consciousness,  Tremor, insomnia, depression, anxiety, and suicidal ideation.    Physical Exam:  BP 120/72   Pulse 66   Temp 98.2 F (36.8 C) (Oral)   Ht 5' 5.5 (1.664 m)   Wt 204 lb 9.6 oz (92.8 kg)   SpO2 97%   BMI 33.53 kg/m    Physical Exam Vitals reviewed.  Constitutional:      General: She is not in acute distress.    Appearance: Normal appearance. She is well-developed and normal weight. She is not ill-appearing, toxic-appearing or diaphoretic.  HENT:     Head: Normocephalic.     Right Ear: Tympanic membrane, ear canal and external ear normal. There is no impacted cerumen.     Left Ear: Tympanic membrane, ear canal and external ear normal.  There is no impacted cerumen.     Nose: Nose normal.     Mouth/Throat:     Mouth: Mucous membranes are moist.     Pharynx: Oropharynx is clear.  Eyes:     General: No scleral icterus.       Right eye: No discharge.        Left eye: No discharge.     Conjunctiva/sclera: Conjunctivae normal.     Pupils: Pupils are equal, round, and reactive to light.  Neck:     Thyroid : No thyromegaly.     Vascular: No carotid bruit or JVD.   Cardiovascular:     Rate and Rhythm: Normal rate and regular rhythm.     Heart sounds: Normal heart sounds.  Pulmonary:     Effort: Pulmonary effort is normal. No respiratory distress.     Breath sounds: Normal breath sounds.  Chest:  Breasts:    Breasts are symmetrical.     Right: Normal. No swelling, inverted nipple, mass, nipple discharge, skin change or tenderness.     Left: Normal. No swelling, inverted nipple, mass, nipple discharge, skin change or tenderness.  Abdominal:     General: Bowel sounds are normal.     Palpations: Abdomen is soft. There is no mass.     Tenderness: There is no abdominal tenderness. There is no guarding or rebound.  Musculoskeletal:        General: Normal range of motion.     Cervical back: Normal range of motion and neck supple.  Lymphadenopathy:     Cervical: No cervical adenopathy.     Upper Body:     Right upper body: No supraclavicular, axillary or pectoral adenopathy.     Left upper body: No supraclavicular, axillary or pectoral adenopathy.  Skin:    General: Skin is warm and dry.  Neurological:     General: No focal deficit present.     Mental Status: She is alert and oriented to person, place, and time. Mental status is at baseline.  Psychiatric:        Mood and Affect: Mood normal.        Behavior: Behavior normal.        Thought Content: Thought content normal.        Judgment: Judgment normal.     Assessment and Plan: Encounter for preventive health examination Assessment & Plan: age appropriate education and counseling updated, referrals for preventative services and immunizations addressed, dietary and smoking counseling addressed, most recent labs reviewed.  I have personally reviewed and have noted:   1) the patient's medical and social history 2) The pt's use of alcohol, tobacco, and illicit drugs 3) The patient's current medications and supplements 4) Functional ability including ADL's, fall risk, home safety risk, hearing  and visual impairment 5) Diet and physical activities 6) Evidence for depression or mood disorder 7) The patient's height, weight, and BMI have been recorded in the chartrhythm.     I have made referrals, and provided counseling and education based on review of the above    Primary hypertension Assessment & Plan: She is at goal  of < 130/80  CKD,  which she confirms has been achieved with home readings.  Continue telmisartan  80 mg and amlodipine   2.5 mg daily   Orders: -     Telmisartan ; Take 1 tablet (80 mg total) by mouth every evening.  Dispense: 90 tablet; Refill: 1 -     Comprehensive metabolic panel with GFR  Prediabetes Assessment &  Plan: Plasn to sstart a berberine supplement mailed to her by her insurance company  Orders: -     Hemoglobin A1c  Irritable bowel syndrome with diarrhea Assessment & Plan: Diarrhea predominant,  with intermittent symptoms. Managed with dietary discretion and avoidance of ice cream , uses lactosoe fat free milk and ok with yogurt    Mixed hyperlipidemia Assessment & Plan:  She is asymptomatic  taking a statin and asa . Repeat lipids are due  Lab Results  Component Value Date   CHOL 172 11/23/2022   HDL 61.50 11/23/2022   LDLCALC 77 11/23/2022   LDLDIRECT 81.0 11/23/2022   TRIG 168.0 (H) 11/23/2022   CHOLHDL 3 11/23/2022     Orders: -     Lipid panel  Hepatic steatosis Assessment & Plan: Presumed by ultrasound changes and serologies negative for autoimmune causes of hepatitis.  Current liver enzymes are due; they have been normal and all modifiable risk factors including  prediabetes, obesity and hyperlipidemia have been addressed   Lab Results  Component Value Date   ALT 14 05/23/2023   AST 17 05/23/2023   ALKPHOS 72 05/23/2023   BILITOT 0.8 05/23/2023      Neuropathy Assessment & Plan: Mild,  Hereditary  ,  With B12 deficiency addressed.  Activity limiting.  Gabapentin  trial has failed again   Due to of thoracic back  pain reucrrence an elevation of blood pressure.  SHE DEFERS ADDITIONAL THERAPY trials at this time .  Application for disabled parking permit singed.   B12 deficiency -     B12 and Folate Panel  Atherosclerosis of native coronary artery of native heart without angina pectoris Assessment & Plan: Diagnosed in 2018 at time of diagnosis by CT.  She is asymptomatic  taking a statin and asa but LDL is not at  goal . Will recommend change to higher potency staitn   Lab Results  Component Value Date   CHOL 186 11/25/2023   HDL 55.90 11/25/2023   LDLCALC 90 11/25/2023   LDLDIRECT 81.0 11/23/2022   TRIG 199.0 (H) 11/25/2023   CHOLHDL 3 11/25/2023      Stage 3a chronic kidney disease (HCC) Assessment & Plan: Managed by nephrology evaluation.  GFR is stable.  Reviewed BP readings.  Goal of 120/70 as long as her dizziness is caused by orthostasis   Lab Results  Component Value Date   CREATININE 1.43 (H) 11/25/2023      Other orders -     Azelastine HCl; Place 2 sprays into both nostrils 2 (two) times daily. Use in each nostril as directed  Dispense: 30 mL; Refill: 11    Return in about 6 months (around 05/24/2024) for hypertension.  Jillian LITTIE Kettering, MD

## 2023-11-25 NOTE — Patient Instructions (Signed)
  You can take 3000 mg of acetominophen (tylenol ) every day safely  In divided doses (750 mg  every 6 hours  Or 1000 mg every 8 hours.)   The berberine is SAFE TO USE

## 2023-11-26 MED ORDER — ROSUVASTATIN CALCIUM 20 MG PO TABS: 20.0000 mg | ORAL_TABLET | Freq: Every day | ORAL | 1 refills | Status: AC

## 2023-11-26 NOTE — Addendum Note (Signed)
 Addended by: MARYLYNN VERNEITA CROME on: 11/26/2023 12:39 PM   Modules accepted: Orders

## 2023-11-26 NOTE — Assessment & Plan Note (Signed)
 REPLACING SIMVASTATIN  WITH ROSUVASTITN 20 MG FOR LDL GOAL OF < 70  Lab Results  Component Value Date   CHOL 186 11/25/2023   HDL 55.90 11/25/2023   LDLCALC 90 11/25/2023   LDLDIRECT 81.0 11/23/2022   TRIG 199.0 (H) 11/25/2023   CHOLHDL 3 11/25/2023

## 2023-12-10 DIAGNOSIS — L570 Actinic keratosis: Secondary | ICD-10-CM | POA: Diagnosis not present

## 2023-12-10 DIAGNOSIS — L218 Other seborrheic dermatitis: Secondary | ICD-10-CM | POA: Diagnosis not present

## 2023-12-10 DIAGNOSIS — L821 Other seborrheic keratosis: Secondary | ICD-10-CM | POA: Diagnosis not present

## 2023-12-10 DIAGNOSIS — L578 Other skin changes due to chronic exposure to nonionizing radiation: Secondary | ICD-10-CM | POA: Diagnosis not present

## 2023-12-10 DIAGNOSIS — Z872 Personal history of diseases of the skin and subcutaneous tissue: Secondary | ICD-10-CM | POA: Diagnosis not present

## 2023-12-10 DIAGNOSIS — Z85828 Personal history of other malignant neoplasm of skin: Secondary | ICD-10-CM | POA: Diagnosis not present

## 2023-12-18 ENCOUNTER — Ambulatory Visit

## 2023-12-18 DIAGNOSIS — E538 Deficiency of other specified B group vitamins: Secondary | ICD-10-CM | POA: Diagnosis not present

## 2023-12-18 DIAGNOSIS — Z23 Encounter for immunization: Secondary | ICD-10-CM

## 2023-12-18 MED ORDER — CYANOCOBALAMIN 1000 MCG/ML IJ SOLN
1000.0000 ug | Freq: Once | INTRAMUSCULAR | Status: AC
  Administered 2023-12-18: 1000 ug via INTRAMUSCULAR

## 2023-12-18 NOTE — Progress Notes (Signed)
 Patient was administered a B12 injection into her left deltoid. Patient tolerated the B12 injection well.  Patient was administered a high dose flu vaccine into her right deltoid. Patient tolerated the high dose flu vaccine well.

## 2024-01-20 ENCOUNTER — Ambulatory Visit (INDEPENDENT_AMBULATORY_CARE_PROVIDER_SITE_OTHER)

## 2024-01-20 DIAGNOSIS — E538 Deficiency of other specified B group vitamins: Secondary | ICD-10-CM

## 2024-01-20 MED ORDER — CYANOCOBALAMIN 1000 MCG/ML IJ SOLN
1000.0000 ug | Freq: Once | INTRAMUSCULAR | Status: AC
  Administered 2024-01-20: 1000 ug via INTRAMUSCULAR

## 2024-01-20 NOTE — Progress Notes (Signed)
After obtaining consent, and per orders of Dr. Tullo, injection of B-12 given IM in right deltoid by Theron Cumbie Lynn. Patient tolerated injection well.  

## 2024-02-24 ENCOUNTER — Ambulatory Visit

## 2024-03-10 ENCOUNTER — Ambulatory Visit: Payer: Medicare Other

## 2024-03-17 ENCOUNTER — Ambulatory Visit

## 2024-03-17 ENCOUNTER — Other Ambulatory Visit: Payer: Self-pay | Admitting: Internal Medicine

## 2024-03-17 VITALS — Ht 66.0 in | Wt 204.0 lb

## 2024-03-17 DIAGNOSIS — Z1231 Encounter for screening mammogram for malignant neoplasm of breast: Secondary | ICD-10-CM | POA: Diagnosis not present

## 2024-03-17 DIAGNOSIS — E538 Deficiency of other specified B group vitamins: Secondary | ICD-10-CM

## 2024-03-17 DIAGNOSIS — Z0000 Encounter for general adult medical examination without abnormal findings: Secondary | ICD-10-CM | POA: Diagnosis not present

## 2024-03-17 MED ORDER — CYANOCOBALAMIN 1000 MCG/ML IJ SOLN
1000.0000 ug | Freq: Once | INTRAMUSCULAR | Status: AC
  Administered 2024-03-17: 1000 ug via INTRAMUSCULAR

## 2024-03-17 NOTE — Progress Notes (Signed)
 "  Chief Complaint  Patient presents with   Medicare Wellness     Subjective:   Jillian [name] is a 74 y.o. female who presents for a Medicare Annual Wellness Visit.  Visit info / Clinical Intake: Medicare Wellness Visit Type:: Subsequent Annual Wellness Visit Persons participating in visit and providing information:: patient Medicare Wellness Visit Mode:: Telephone If telephone:: video declined Since this visit was completed virtually, some vitals may be partially provided or unavailable. Missing vitals are due to the limitations of the virtual format.: Unable to obtain vitals - no equipment If Telephone or Video please confirm:: I connected with patient using audio/video enable telemedicine. I verified patient identity with two identifiers, discussed telehealth limitations, and patient agreed to proceed. Patient Location:: Home Provider Location:: Office/home Interpreter Needed?: No Pre-visit prep was completed: yes AWV questionnaire completed by patient prior to visit?: no Living arrangements:: (!) lives alone; in retirement community Patient's Overall Health Status Rating: good Typical amount of pain: some (arthritis) Does pain affect daily life?: no Are you currently prescribed opioids?: no  Dietary Habits and Nutritional Risks How many meals a day?: 2 Eats fruit and vegetables daily?: yes Most meals are obtained by: preparing own meals In the last 2 weeks, have you had any of the following?: none Diabetic:: no  Functional Status Activities of Daily Living (to include ambulation/medication): Independent Ambulation: Independent Medication Administration: Independent Home Management (perform basic housework or laundry): Independent Manage your own finances?: yes Primary transportation is: driving Concerns about vision?: no *vision screening is required for WTM* Concerns about hearing?: no  Fall Screening Falls in the past year?: 0 Number of falls in past year:  0 Was there an injury with Fall?: 0 Fall Risk Category Calculator: 0 Patient Fall Risk Level: Low Fall Risk  Fall Risk Patient at Risk for Falls Due to: No Fall Risks Fall risk Follow up: Falls evaluation completed; Falls prevention discussed  Home and Transportation Safety: All rugs have non-skid backing?: yes All stairs or steps have railings?: yes Grab bars in the bathtub or shower?: yes Have non-skid surface in bathtub or shower?: yes Good home lighting?: yes Regular seat belt use?: yes [name] stays in the last year:: no  Cognitive Assessment Difficulty concentrating, remembering, or making decisions? : no Will 6CIT or Mini Cog be Completed: yes What year is it?: 0 points What month is it?: 0 points Give patient an address phrase to remember (5 components): [address] [place] About what time is it?: 0 points Count backwards from 20 to 1: 0 points Say the months of the year in reverse: 0 points Repeat the address phrase from earlier: 0 points 6 CIT Score: 0 points  Advance Directives (For Healthcare) Does Patient Have a Medical Advance Directive?: Yes Does patient want to make changes to medical advance directive?: No - Patient declined Type of Advance Directive: Healthcare Power of [place]; Living will Copy of Healthcare Power of Attorney in Chart?: No - copy requested Copy of Living Will in Chart?: No - copy requested  Reviewed/Updated  Reviewed/Updated: Reviewed All (Medical, Surgical, Family, Medications, Allergies, Care Teams, Patient Goals)    Allergies (verified) Doxycycline , Gabapentin , Sulfa drugs cross reactors, and Tetanus toxoid-containing vaccines   Current Medications (verified) Outpatient Encounter Medications as of 03/17/2024  Medication Sig   amLODipine  (NORVASC ) 2.5 MG tablet TAKE 1 TABLET BY MOUTH DAILY   aspirin 81 MG tablet Take 81 mg by mouth daily.   azelastine  (ASTELIN ) 0.1 % nasal spray Place 2  sprays into both nostrils 2 (two)  times daily. Use in each nostril as directed   cetirizine (ZYRTEC) 10 MG tablet Take 10 mg by mouth at bedtime.   Cholecalciferol (VITAMIN D3) 1000 UNITS CAPS Take 1,000 Units by mouth daily.   estradiol  (ESTRACE  VAGINAL) 0.1 MG/GM vaginal cream Place 0.5 g vaginally 3 (three) times a week.   famotidine (PEPCID) 10 MG tablet Take 10 mg by mouth daily. As needed   Fluocinolone Acetonide 0.01 % OIL Apply 1 application topically See admin instructions. Apply to affected ear daily as needed for dermatisis   fluticasone  (FLONASE ) 50 MCG/ACT nasal spray PLACE 2 SPRAYS INTO THE NOSE DAILY.   glycerin  adult 2 g suppository Place 1 suppository rectally as needed for constipation.   NON FORMULARY Apply 1 application topically daily as needed (for dermatisis on face). Hydrocortisone 2.5%/Ketoconazole 2% Cream   nystatin  (MYCOSTATIN /NYSTOP ) powder Apply topically 2 (two) times daily. To rash until resolved. (Patient taking differently: Apply topically 2 (two) times daily as needed. To rash until resolved.)   Psyllium (METAMUCIL FIBER PO) Take by mouth. Take 1 tablet daily.   rosuvastatin  (CRESTOR ) 20 MG tablet Take 1 tablet (20 mg total) by mouth daily.   telmisartan  (MICARDIS ) 80 MG tablet Take 1 tablet (80 mg total) by mouth every evening.   traZODone  (DESYREL ) 50 MG tablet Take 0.5-1 tablets (25-50 mg total) by mouth at bedtime as needed for sleep.   [EXPIRED] cyanocobalamin  (VITAMIN B12) injection 1,000 mcg    No facility-administered encounter medications on file as of 03/17/2024.    History: Past Medical History:  Diagnosis Date   Allergy    seasonal rhinitis   Arthritis    right shoulder,  no intervention   Cancer (HCC)    basal cell    Chronic kidney disease    Chronic right maxillary sinusitis 08/13/2018   GERD (gastroesophageal reflux disease)    Hyperlipidemia    Hypertension    Irritable bowel syndrome (IBS) 11/2009   last colonoscopy showed diverticulum, no polyps   Neuromuscular  disorder (HCC)    Neuropathy    FEET   Pneumonia    Thrombophlebitis of left lower extremity (HCC) 09/29/2019   Vertigo    OCCAS   Past Surgical History:  Procedure Laterality Date   APPENDECTOMY     BREAST BIOPSY Right 03/12/2019   us  bx neg   CATARACT EXTRACTION W/PHACO Left 04/09/2017   Procedure: CATARACT EXTRACTION PHACO AND INTRAOCULAR LENS PLACEMENT (IOC);  Surgeon: Jaye Fallow, MD;  Location: [facility] ORS;  Service: Ophthalmology;  Laterality: Left;  US  00:29.6 AP% 18.4 CDE 5.44 FLUID PACK LOT # [id number]   CATARACT EXTRACTION W/PHACO Right 04/30/2017   Procedure: CATARACT EXTRACTION PHACO AND INTRAOCULAR LENS PLACEMENT (IOC);  Surgeon: Jaye Fallow, MD;  Location: [facility] ORS;  Service: Ophthalmology;  Laterality: Right;  US  00:43.1 AP% 13.7 CDE 5.91 Fluid Pack Lot # [id number]   COLONOSCOPY     COLONOSCOPY WITH PROPOFOL  N/A 11/15/2022   Procedure: COLONOSCOPY WITH PROPOFOL ;  Surgeon: Jinny Carmine, MD;  Location: [facility] ENDOSCOPY;  Service: Endoscopy;  Laterality: N/A;   EYE SURGERY     POLYPECTOMY  11/15/2022   Procedure: POLYPECTOMY;  Surgeon: Jinny Carmine, MD;  Location: [facility] ENDOSCOPY;  Service: Endoscopy;;   right breast bx  03/12/2019   TONSILLECTOMY AND ADENOIDECTOMY  1956   TUBAL LIGATION  1977   VEIN SURGERY     Family History  Problem Relation Age of Onset   Hyperlipidemia Mother  Anxiety disorder Mother    Depression Mother    Heart disease Mother    Hyperlipidemia Father    Hypertension Father    Heart disease Father    Breast cancer Sister 69   Anxiety disorder Sister    Cancer Sister    Depression Sister    Cancer Sister        'blood cancer', dx 68   Hyperlipidemia Maternal Grandmother    Stroke Maternal Grandmother    Hyperlipidemia Maternal Grandfather    Cancer Cousin    Social History   Occupational History   Occupation: cutsodial    Employer: [employer]    Comment: works at [employer]  Tobacco Use   Smoking status: Former    Current  packs/day: 0.00    Average packs/day: 1.5 packs/day for 45.0 years (67.5 ttl pk-yrs)    Types: Cigarettes    Quit date: 03/16/1999    Years since quitting: 25.0   Smokeless tobacco: Never   Tobacco comments:    Quit 24 years ago  Vaping Use   Vaping status: Never Used  Substance and Sexual Activity   Alcohol use: No   Drug use: No   Sexual activity: Not Currently   Tobacco Counseling Counseling given: Not Answered Tobacco comments: Quit 24 years ago  SDOH Screenings   Food Insecurity: No Food Insecurity (03/17/2024)  Housing: Low Risk (03/17/2024)  Transportation Needs: No Transportation Needs (03/17/2024)  Utilities: Not At Risk (03/17/2024)  Alcohol Screen: Low Risk (03/17/2024)  Depression (PHQ2-9): Low Risk (03/17/2024)  Financial Resource Strain: Low Risk (03/17/2024)  Physical Activity: Insufficiently Active (03/17/2024)  Social Connections: Socially Isolated (03/17/2024)  Stress: No Stress Concern Present (03/17/2024)  Tobacco Use: Medium Risk (03/17/2024)  Health Literacy: Adequate Health Literacy (03/17/2024)   See flowsheets for full screening details  Depression Screen PHQ 2 & 9 Depression Scale- Over the past 2 weeks, how often have you been bothered by any of the following problems? Little interest or pleasure in doing things: 0 Feeling down, depressed, or hopeless (PHQ Adolescent also includes...irritable): 0 PHQ-2 Total Score: 0 Trouble falling or staying asleep, or sleeping too much: 1 Feeling tired or having little energy: 1 Poor appetite or overeating (PHQ Adolescent also includes...weight loss): 0 Feeling bad about yourself - or that you are a failure or have let yourself or your family down: 0 Trouble concentrating on things, such as reading the newspaper or watching television (PHQ Adolescent also includes...like school work): 0 Moving or speaking so slowly that other people could have noticed. Or the opposite - being so fidgety or restless that you have been  moving around a lot more than usual: 0 Thoughts that you would be better off dead, or of hurting yourself in some way: 0 PHQ-9 Total Score: 2 If you checked off any problems, how difficult have these problems made it for you to do your work, take care of things at home, or get along with other people?: Not difficult at all     Goals Addressed             This Visit's Progress    Patient Stated       Wants to lose weight             Objective:    Today's Vitals   03/17/24 1548  Weight: 204 lb (92.5 kg)  Height: 5' 6 (1.676 m)   Body mass index is 32.93 kg/m.  Hearing/Vision screen Hearing Screening - Comments:: No issues Vision Screening -  Comments:: Glasses, [facility], up to date Immunizations and Health Maintenance Health Maintenance  Topic Date Due   COVID-19 Vaccine (5 - 2025-26 season) 10/28/2023   Mammogram  05/02/2024   Medicare Annual Wellness (AWV)  03/17/2025   Fecal DNA (Cologuard)  07/15/2025   Pneumococcal Vaccine: 50+ Years  Completed   Influenza Vaccine  Completed   Bone Density Scan  Completed   Hepatitis C Screening  Completed   Zoster Vaccines- Shingrix  Completed   Meningococcal B Vaccine  Aged Out   DTaP/Tdap/Td  Discontinued   Hepatitis B Vaccines 19-59 Average Risk  Discontinued   Colonoscopy  Discontinued        Assessment/Plan:  This is a routine wellness examination for Jillian [name].  Patient Care Team: Marylynn Verneita CROME, MD as PCP - General (Internal Medicine)  I have personally reviewed and noted the following in the patients chart:   Medical and social history Use of alcohol, tobacco or illicit drugs  Current medications and supplements including opioid prescriptions. Functional ability and status Nutritional status Physical activity Advanced directives List of other physicians Hospitalizations, surgeries, and ER visits in previous 12 months Vitals Screenings to include cognitive, depression, and falls Referrals and  appointments  Orders Placed This Encounter  Procedures   MM 3D SCREENING MAMMOGRAM BILATERAL BREAST    Standing Status:   Future    Expiration Date:   03/17/2025    Reason for Exam (SYMPTOM  OR DIAGNOSIS REQUIRED):   need for cancer screening    Preferred imaging location?:   [facility]   In addition, I have reviewed and discussed with patient certain preventive protocols, quality metrics, and best practice recommendations. A written personalized care plan for preventive services as well as general preventive health recommendations were provided to patient.   Angeline Fredericks, LPN   8/79/7973   Return in 1 year (on 03/17/2025).  After Visit Summary: (MyChart) Due to this being a telephonic visit, the after visit summary with patients personalized plan was offered to patient via MyChart   Nurse Notes: Mammogram order placed. Patient declines covid vaccine. Patient wants to discuss Dexa scan with PCP at upcoming visit. "

## 2024-03-17 NOTE — Patient Instructions (Signed)
 [name],  Thank you for taking the time for your Medicare Wellness Visit. I appreciate your continued commitment to your health goals. Please review the care plan we discussed, and feel free to reach out if I can assist you further.  Please note that Annual Wellness Visits do not include a physical exam. Some assessments may be limited, especially if the visit was conducted virtually. If needed, we may recommend an in-person follow-up with your provider.  Ongoing Care Seeing your primary care provider every 3 to 6 months helps us  monitor your health and provide consistent, personalized care.  You have an order for:  []   2D Mammogram  [x]   3D Mammogram  []   Bone Density     Please call for appointment:  [facility]  [address] [place] [address] [phone]   Make sure to wear two-piece clothing.  No lotions, powders, or deodorants the day of the appointment. Make sure to bring picture ID and insurance card.  Bring list of medications you are currently taking including any supplements.    Referrals If a referral was made during today's visit and you haven't received any updates within two weeks, please contact the referred provider directly to check on the status.  Recommended Screenings:  Health Maintenance  Topic Date Due   COVID-19 Vaccine (5 - 2025-26 season) 10/28/2023   Breast Cancer Screening  05/02/2024   Medicare Annual Wellness Visit  03/17/2025   Cologuard (Stool DNA test)  07/15/2025   Pneumococcal Vaccine for age over 35  Completed   Flu Shot  Completed   Osteoporosis screening with Bone Density Scan  Completed   Hepatitis C Screening  Completed   Zoster (Shingles) Vaccine  Completed   Meningitis B Vaccine  Aged Out   DTaP/Tdap/Td vaccine  Discontinued   Hepatitis B Vaccine  Discontinued   Colon Cancer Screening  Discontinued       03/17/2024    3:51 PM  Advanced Directives  Does Patient  Have a Medical Advance Directive? Yes  Type of Estate Agent of [place];Living will  Does patient want to make changes to medical advance directive? No - Patient declined  Copy of Healthcare Power of Attorney in Chart? No - copy requested    Vision: Annual vision screenings are recommended for early detection of glaucoma, cataracts, and diabetic retinopathy. These exams can also reveal signs of chronic conditions such as diabetes and high blood pressure.  Dental: Annual dental screenings help detect early signs of oral cancer, gum disease, and other conditions linked to overall health, including heart disease and diabetes.  Please see the attached documents for additional preventive care recommendations.

## 2024-03-17 NOTE — Progress Notes (Signed)
 Pt received B12 injection in Left  deltoid muscle. Pt tolerated it well with no complaints or concerns.

## 2024-04-20 ENCOUNTER — Ambulatory Visit

## 2024-05-04 ENCOUNTER — Encounter

## 2024-05-25 ENCOUNTER — Ambulatory Visit: Admitting: Internal Medicine

## 2025-03-24 ENCOUNTER — Ambulatory Visit
